# Patient Record
Sex: Female | Born: 1975 | Race: White | Hispanic: No | Marital: Single | Smoking: Current every day smoker
Health system: Southern US, Community
[De-identification: ages and names within clinical notes are randomized; demographics above are authoritative.]

## PROBLEM LIST (undated history)

## (undated) DIAGNOSIS — M797 Fibromyalgia: Secondary | ICD-10-CM

## (undated) DIAGNOSIS — M419 Scoliosis, unspecified: Secondary | ICD-10-CM

## (undated) DIAGNOSIS — G5600 Carpal tunnel syndrome, unspecified upper limb: Secondary | ICD-10-CM

## (undated) DIAGNOSIS — K7689 Other specified diseases of liver: Secondary | ICD-10-CM

## (undated) DIAGNOSIS — K219 Gastro-esophageal reflux disease without esophagitis: Secondary | ICD-10-CM

## (undated) DIAGNOSIS — K449 Diaphragmatic hernia without obstruction or gangrene: Secondary | ICD-10-CM

## (undated) HISTORY — DX: Diaphragmatic hernia without obstruction or gangrene: K44.9

## (undated) HISTORY — PX: CERVICAL SPINE SURGERY: SHX589

## (undated) HISTORY — DX: Scoliosis, unspecified: M41.9

## (undated) HISTORY — DX: Other specified diseases of liver: K76.89

## (undated) HISTORY — DX: Carpal tunnel syndrome, unspecified upper limb: G56.00

## (undated) HISTORY — PX: OTHER SURGICAL HISTORY: SHX169

## (undated) HISTORY — PX: TUBAL LIGATION: SHX77

---

## 1999-03-24 ENCOUNTER — Ambulatory Visit (HOSPITAL_COMMUNITY): Admission: RE | Admit: 1999-03-24 | Discharge: 1999-03-24 | Payer: Self-pay | Admitting: *Deleted

## 2016-06-11 DIAGNOSIS — Z6820 Body mass index (BMI) 20.0-20.9, adult: Secondary | ICD-10-CM | POA: Diagnosis not present

## 2016-06-11 DIAGNOSIS — J069 Acute upper respiratory infection, unspecified: Secondary | ICD-10-CM | POA: Diagnosis not present

## 2016-10-10 DIAGNOSIS — M545 Low back pain: Secondary | ICD-10-CM | POA: Diagnosis not present

## 2016-11-20 DIAGNOSIS — Z720 Tobacco use: Secondary | ICD-10-CM | POA: Diagnosis not present

## 2016-11-20 DIAGNOSIS — Z681 Body mass index (BMI) 19 or less, adult: Secondary | ICD-10-CM | POA: Diagnosis not present

## 2016-11-20 DIAGNOSIS — M545 Low back pain: Secondary | ICD-10-CM | POA: Diagnosis not present

## 2016-12-05 DIAGNOSIS — M545 Low back pain: Secondary | ICD-10-CM | POA: Diagnosis not present

## 2016-12-07 DIAGNOSIS — K047 Periapical abscess without sinus: Secondary | ICD-10-CM | POA: Diagnosis not present

## 2016-12-07 DIAGNOSIS — Z681 Body mass index (BMI) 19 or less, adult: Secondary | ICD-10-CM | POA: Diagnosis not present

## 2016-12-24 DIAGNOSIS — Z20828 Contact with and (suspected) exposure to other viral communicable diseases: Secondary | ICD-10-CM | POA: Diagnosis not present

## 2016-12-24 DIAGNOSIS — J069 Acute upper respiratory infection, unspecified: Secondary | ICD-10-CM | POA: Diagnosis not present

## 2016-12-24 DIAGNOSIS — R51 Headache: Secondary | ICD-10-CM | POA: Diagnosis not present

## 2016-12-24 DIAGNOSIS — Z6820 Body mass index (BMI) 20.0-20.9, adult: Secondary | ICD-10-CM | POA: Diagnosis not present

## 2017-03-24 DIAGNOSIS — J020 Streptococcal pharyngitis: Secondary | ICD-10-CM | POA: Diagnosis not present

## 2017-04-05 DIAGNOSIS — Z1389 Encounter for screening for other disorder: Secondary | ICD-10-CM | POA: Diagnosis not present

## 2017-04-05 DIAGNOSIS — Z0000 Encounter for general adult medical examination without abnormal findings: Secondary | ICD-10-CM | POA: Diagnosis not present

## 2017-04-05 DIAGNOSIS — Z681 Body mass index (BMI) 19 or less, adult: Secondary | ICD-10-CM | POA: Diagnosis not present

## 2017-06-04 DIAGNOSIS — L259 Unspecified contact dermatitis, unspecified cause: Secondary | ICD-10-CM | POA: Diagnosis not present

## 2017-06-04 DIAGNOSIS — L299 Pruritus, unspecified: Secondary | ICD-10-CM | POA: Diagnosis not present

## 2017-06-19 DIAGNOSIS — R1083 Colic: Secondary | ICD-10-CM | POA: Diagnosis not present

## 2017-06-19 DIAGNOSIS — R350 Frequency of micturition: Secondary | ICD-10-CM | POA: Diagnosis not present

## 2017-06-19 DIAGNOSIS — K5900 Constipation, unspecified: Secondary | ICD-10-CM | POA: Diagnosis not present

## 2017-06-19 DIAGNOSIS — Z681 Body mass index (BMI) 19 or less, adult: Secondary | ICD-10-CM | POA: Diagnosis not present

## 2018-01-25 DIAGNOSIS — R05 Cough: Secondary | ICD-10-CM | POA: Diagnosis not present

## 2018-01-25 DIAGNOSIS — J209 Acute bronchitis, unspecified: Secondary | ICD-10-CM | POA: Diagnosis not present

## 2018-04-18 DIAGNOSIS — Z6820 Body mass index (BMI) 20.0-20.9, adult: Secondary | ICD-10-CM | POA: Diagnosis not present

## 2018-04-18 DIAGNOSIS — K047 Periapical abscess without sinus: Secondary | ICD-10-CM | POA: Diagnosis not present

## 2019-01-23 DIAGNOSIS — R05 Cough: Secondary | ICD-10-CM | POA: Diagnosis not present

## 2019-01-23 DIAGNOSIS — J209 Acute bronchitis, unspecified: Secondary | ICD-10-CM | POA: Diagnosis not present

## 2019-07-10 DIAGNOSIS — R600 Localized edema: Secondary | ICD-10-CM | POA: Diagnosis not present

## 2019-07-10 DIAGNOSIS — M25511 Pain in right shoulder: Secondary | ICD-10-CM | POA: Diagnosis not present

## 2019-07-10 DIAGNOSIS — R252 Cramp and spasm: Secondary | ICD-10-CM | POA: Diagnosis not present

## 2019-07-10 DIAGNOSIS — I872 Venous insufficiency (chronic) (peripheral): Secondary | ICD-10-CM | POA: Diagnosis not present

## 2019-07-20 DIAGNOSIS — M25511 Pain in right shoulder: Secondary | ICD-10-CM | POA: Diagnosis not present

## 2019-07-22 DIAGNOSIS — M25511 Pain in right shoulder: Secondary | ICD-10-CM | POA: Diagnosis not present

## 2019-07-22 DIAGNOSIS — M546 Pain in thoracic spine: Secondary | ICD-10-CM | POA: Diagnosis not present

## 2019-07-22 DIAGNOSIS — M542 Cervicalgia: Secondary | ICD-10-CM | POA: Diagnosis not present

## 2019-07-28 DIAGNOSIS — M542 Cervicalgia: Secondary | ICD-10-CM | POA: Diagnosis not present

## 2019-07-28 DIAGNOSIS — M25511 Pain in right shoulder: Secondary | ICD-10-CM | POA: Diagnosis not present

## 2019-07-28 DIAGNOSIS — M546 Pain in thoracic spine: Secondary | ICD-10-CM | POA: Diagnosis not present

## 2019-08-31 DIAGNOSIS — M7541 Impingement syndrome of right shoulder: Secondary | ICD-10-CM | POA: Diagnosis not present

## 2019-08-31 DIAGNOSIS — M25511 Pain in right shoulder: Secondary | ICD-10-CM | POA: Diagnosis not present

## 2019-09-04 DIAGNOSIS — M25511 Pain in right shoulder: Secondary | ICD-10-CM | POA: Diagnosis not present

## 2019-09-11 DIAGNOSIS — M7501 Adhesive capsulitis of right shoulder: Secondary | ICD-10-CM | POA: Diagnosis not present

## 2019-09-11 DIAGNOSIS — M7541 Impingement syndrome of right shoulder: Secondary | ICD-10-CM | POA: Diagnosis not present

## 2019-09-11 DIAGNOSIS — M5412 Radiculopathy, cervical region: Secondary | ICD-10-CM | POA: Diagnosis not present

## 2019-10-07 DIAGNOSIS — M7501 Adhesive capsulitis of right shoulder: Secondary | ICD-10-CM | POA: Diagnosis not present

## 2019-10-15 DIAGNOSIS — M5412 Radiculopathy, cervical region: Secondary | ICD-10-CM | POA: Diagnosis not present

## 2019-10-15 DIAGNOSIS — M4802 Spinal stenosis, cervical region: Secondary | ICD-10-CM | POA: Diagnosis not present

## 2019-10-20 DIAGNOSIS — E559 Vitamin D deficiency, unspecified: Secondary | ICD-10-CM | POA: Diagnosis not present

## 2019-10-20 DIAGNOSIS — R52 Pain, unspecified: Secondary | ICD-10-CM | POA: Diagnosis not present

## 2019-10-20 DIAGNOSIS — J449 Chronic obstructive pulmonary disease, unspecified: Secondary | ICD-10-CM | POA: Diagnosis not present

## 2019-10-20 DIAGNOSIS — M79603 Pain in arm, unspecified: Secondary | ICD-10-CM | POA: Diagnosis not present

## 2019-10-20 DIAGNOSIS — Z79899 Other long term (current) drug therapy: Secondary | ICD-10-CM | POA: Diagnosis not present

## 2019-10-20 DIAGNOSIS — Z01818 Encounter for other preprocedural examination: Secondary | ICD-10-CM | POA: Diagnosis not present

## 2019-10-20 DIAGNOSIS — M5412 Radiculopathy, cervical region: Secondary | ICD-10-CM | POA: Diagnosis not present

## 2019-10-29 DIAGNOSIS — Z1331 Encounter for screening for depression: Secondary | ICD-10-CM | POA: Diagnosis not present

## 2019-10-29 DIAGNOSIS — Z01818 Encounter for other preprocedural examination: Secondary | ICD-10-CM | POA: Diagnosis not present

## 2019-10-29 DIAGNOSIS — M5412 Radiculopathy, cervical region: Secondary | ICD-10-CM | POA: Diagnosis not present

## 2019-11-16 DIAGNOSIS — C50121 Malignant neoplasm of central portion of right male breast: Secondary | ICD-10-CM | POA: Diagnosis not present

## 2019-11-16 DIAGNOSIS — M50122 Cervical disc disorder at C5-C6 level with radiculopathy: Secondary | ICD-10-CM | POA: Diagnosis not present

## 2019-11-16 DIAGNOSIS — M50121 Cervical disc disorder at C4-C5 level with radiculopathy: Secondary | ICD-10-CM | POA: Diagnosis not present

## 2019-11-16 DIAGNOSIS — M5412 Radiculopathy, cervical region: Secondary | ICD-10-CM | POA: Diagnosis not present

## 2019-11-16 DIAGNOSIS — F17210 Nicotine dependence, cigarettes, uncomplicated: Secondary | ICD-10-CM | POA: Diagnosis not present

## 2019-11-16 DIAGNOSIS — Z79891 Long term (current) use of opiate analgesic: Secondary | ICD-10-CM | POA: Diagnosis not present

## 2019-11-16 DIAGNOSIS — G8929 Other chronic pain: Secondary | ICD-10-CM | POA: Diagnosis not present

## 2019-11-16 DIAGNOSIS — Z79899 Other long term (current) drug therapy: Secondary | ICD-10-CM | POA: Diagnosis not present

## 2019-11-16 DIAGNOSIS — J449 Chronic obstructive pulmonary disease, unspecified: Secondary | ICD-10-CM | POA: Diagnosis not present

## 2019-11-16 DIAGNOSIS — K5909 Other constipation: Secondary | ICD-10-CM | POA: Diagnosis not present

## 2019-11-16 DIAGNOSIS — F410 Panic disorder [episodic paroxysmal anxiety] without agoraphobia: Secondary | ICD-10-CM | POA: Diagnosis not present

## 2019-11-17 DIAGNOSIS — Z79899 Other long term (current) drug therapy: Secondary | ICD-10-CM | POA: Diagnosis not present

## 2019-11-17 DIAGNOSIS — Z79891 Long term (current) use of opiate analgesic: Secondary | ICD-10-CM | POA: Diagnosis not present

## 2019-11-17 DIAGNOSIS — F17210 Nicotine dependence, cigarettes, uncomplicated: Secondary | ICD-10-CM | POA: Diagnosis not present

## 2019-11-17 DIAGNOSIS — K5909 Other constipation: Secondary | ICD-10-CM | POA: Diagnosis not present

## 2019-11-17 DIAGNOSIS — J449 Chronic obstructive pulmonary disease, unspecified: Secondary | ICD-10-CM | POA: Diagnosis not present

## 2019-11-17 DIAGNOSIS — M50121 Cervical disc disorder at C4-C5 level with radiculopathy: Secondary | ICD-10-CM | POA: Diagnosis not present

## 2019-11-17 DIAGNOSIS — G8929 Other chronic pain: Secondary | ICD-10-CM | POA: Diagnosis not present

## 2019-11-17 DIAGNOSIS — F410 Panic disorder [episodic paroxysmal anxiety] without agoraphobia: Secondary | ICD-10-CM | POA: Diagnosis not present

## 2020-01-12 DIAGNOSIS — M5412 Radiculopathy, cervical region: Secondary | ICD-10-CM | POA: Diagnosis not present

## 2020-01-12 DIAGNOSIS — Z981 Arthrodesis status: Secondary | ICD-10-CM | POA: Diagnosis not present

## 2020-01-15 DIAGNOSIS — Z20822 Contact with and (suspected) exposure to covid-19: Secondary | ICD-10-CM | POA: Diagnosis not present

## 2020-01-28 DIAGNOSIS — R05 Cough: Secondary | ICD-10-CM | POA: Diagnosis not present

## 2020-02-09 DIAGNOSIS — M542 Cervicalgia: Secondary | ICD-10-CM | POA: Diagnosis not present

## 2020-03-03 DIAGNOSIS — M25611 Stiffness of right shoulder, not elsewhere classified: Secondary | ICD-10-CM | POA: Diagnosis not present

## 2020-03-03 DIAGNOSIS — M25511 Pain in right shoulder: Secondary | ICD-10-CM | POA: Diagnosis not present

## 2020-03-03 DIAGNOSIS — M6281 Muscle weakness (generalized): Secondary | ICD-10-CM | POA: Diagnosis not present

## 2020-03-03 DIAGNOSIS — M542 Cervicalgia: Secondary | ICD-10-CM | POA: Diagnosis not present

## 2020-03-07 DIAGNOSIS — M25511 Pain in right shoulder: Secondary | ICD-10-CM | POA: Diagnosis not present

## 2020-03-07 DIAGNOSIS — M25611 Stiffness of right shoulder, not elsewhere classified: Secondary | ICD-10-CM | POA: Diagnosis not present

## 2020-03-07 DIAGNOSIS — M542 Cervicalgia: Secondary | ICD-10-CM | POA: Diagnosis not present

## 2020-03-07 DIAGNOSIS — M6281 Muscle weakness (generalized): Secondary | ICD-10-CM | POA: Diagnosis not present

## 2020-03-15 DIAGNOSIS — M542 Cervicalgia: Secondary | ICD-10-CM | POA: Diagnosis not present

## 2020-03-18 DIAGNOSIS — M6281 Muscle weakness (generalized): Secondary | ICD-10-CM | POA: Diagnosis not present

## 2020-03-18 DIAGNOSIS — M25511 Pain in right shoulder: Secondary | ICD-10-CM | POA: Diagnosis not present

## 2020-03-18 DIAGNOSIS — M25611 Stiffness of right shoulder, not elsewhere classified: Secondary | ICD-10-CM | POA: Diagnosis not present

## 2020-03-18 DIAGNOSIS — M542 Cervicalgia: Secondary | ICD-10-CM | POA: Diagnosis not present

## 2020-03-25 DIAGNOSIS — M25611 Stiffness of right shoulder, not elsewhere classified: Secondary | ICD-10-CM | POA: Diagnosis not present

## 2020-03-25 DIAGNOSIS — M6281 Muscle weakness (generalized): Secondary | ICD-10-CM | POA: Diagnosis not present

## 2020-03-25 DIAGNOSIS — M25511 Pain in right shoulder: Secondary | ICD-10-CM | POA: Diagnosis not present

## 2020-03-25 DIAGNOSIS — M542 Cervicalgia: Secondary | ICD-10-CM | POA: Diagnosis not present

## 2020-04-05 DIAGNOSIS — M7551 Bursitis of right shoulder: Secondary | ICD-10-CM | POA: Diagnosis not present

## 2020-04-05 DIAGNOSIS — M5412 Radiculopathy, cervical region: Secondary | ICD-10-CM | POA: Diagnosis not present

## 2020-04-07 DIAGNOSIS — M25611 Stiffness of right shoulder, not elsewhere classified: Secondary | ICD-10-CM | POA: Diagnosis not present

## 2020-04-07 DIAGNOSIS — M6281 Muscle weakness (generalized): Secondary | ICD-10-CM | POA: Diagnosis not present

## 2020-04-07 DIAGNOSIS — M542 Cervicalgia: Secondary | ICD-10-CM | POA: Diagnosis not present

## 2020-04-07 DIAGNOSIS — M25511 Pain in right shoulder: Secondary | ICD-10-CM | POA: Diagnosis not present

## 2020-04-19 DIAGNOSIS — M25611 Stiffness of right shoulder, not elsewhere classified: Secondary | ICD-10-CM | POA: Diagnosis not present

## 2020-04-19 DIAGNOSIS — M6281 Muscle weakness (generalized): Secondary | ICD-10-CM | POA: Diagnosis not present

## 2020-04-19 DIAGNOSIS — M25511 Pain in right shoulder: Secondary | ICD-10-CM | POA: Diagnosis not present

## 2020-04-19 DIAGNOSIS — M542 Cervicalgia: Secondary | ICD-10-CM | POA: Diagnosis not present

## 2020-04-22 DIAGNOSIS — M6281 Muscle weakness (generalized): Secondary | ICD-10-CM | POA: Diagnosis not present

## 2020-04-22 DIAGNOSIS — M542 Cervicalgia: Secondary | ICD-10-CM | POA: Diagnosis not present

## 2020-04-22 DIAGNOSIS — M25511 Pain in right shoulder: Secondary | ICD-10-CM | POA: Diagnosis not present

## 2020-04-22 DIAGNOSIS — M25611 Stiffness of right shoulder, not elsewhere classified: Secondary | ICD-10-CM | POA: Diagnosis not present

## 2020-04-27 DIAGNOSIS — M25611 Stiffness of right shoulder, not elsewhere classified: Secondary | ICD-10-CM | POA: Diagnosis not present

## 2020-04-27 DIAGNOSIS — M542 Cervicalgia: Secondary | ICD-10-CM | POA: Diagnosis not present

## 2020-04-27 DIAGNOSIS — M6281 Muscle weakness (generalized): Secondary | ICD-10-CM | POA: Diagnosis not present

## 2020-04-27 DIAGNOSIS — M25511 Pain in right shoulder: Secondary | ICD-10-CM | POA: Diagnosis not present

## 2020-05-05 DIAGNOSIS — M542 Cervicalgia: Secondary | ICD-10-CM | POA: Diagnosis not present

## 2020-05-05 DIAGNOSIS — M25611 Stiffness of right shoulder, not elsewhere classified: Secondary | ICD-10-CM | POA: Diagnosis not present

## 2020-05-05 DIAGNOSIS — M25511 Pain in right shoulder: Secondary | ICD-10-CM | POA: Diagnosis not present

## 2020-05-05 DIAGNOSIS — M6281 Muscle weakness (generalized): Secondary | ICD-10-CM | POA: Diagnosis not present

## 2020-05-11 DIAGNOSIS — M25611 Stiffness of right shoulder, not elsewhere classified: Secondary | ICD-10-CM | POA: Diagnosis not present

## 2020-05-11 DIAGNOSIS — M25511 Pain in right shoulder: Secondary | ICD-10-CM | POA: Diagnosis not present

## 2020-05-11 DIAGNOSIS — M6281 Muscle weakness (generalized): Secondary | ICD-10-CM | POA: Diagnosis not present

## 2020-05-11 DIAGNOSIS — M542 Cervicalgia: Secondary | ICD-10-CM | POA: Diagnosis not present

## 2020-05-13 DIAGNOSIS — M6281 Muscle weakness (generalized): Secondary | ICD-10-CM | POA: Diagnosis not present

## 2020-05-13 DIAGNOSIS — M25511 Pain in right shoulder: Secondary | ICD-10-CM | POA: Diagnosis not present

## 2020-05-13 DIAGNOSIS — M542 Cervicalgia: Secondary | ICD-10-CM | POA: Diagnosis not present

## 2020-05-13 DIAGNOSIS — M25611 Stiffness of right shoulder, not elsewhere classified: Secondary | ICD-10-CM | POA: Diagnosis not present

## 2020-05-18 DIAGNOSIS — M25611 Stiffness of right shoulder, not elsewhere classified: Secondary | ICD-10-CM | POA: Diagnosis not present

## 2020-05-18 DIAGNOSIS — M542 Cervicalgia: Secondary | ICD-10-CM | POA: Diagnosis not present

## 2020-05-18 DIAGNOSIS — M25511 Pain in right shoulder: Secondary | ICD-10-CM | POA: Diagnosis not present

## 2020-05-18 DIAGNOSIS — M6281 Muscle weakness (generalized): Secondary | ICD-10-CM | POA: Diagnosis not present

## 2020-05-27 DIAGNOSIS — S43431A Superior glenoid labrum lesion of right shoulder, initial encounter: Secondary | ICD-10-CM | POA: Diagnosis not present

## 2020-05-27 DIAGNOSIS — M25511 Pain in right shoulder: Secondary | ICD-10-CM | POA: Diagnosis not present

## 2020-05-27 DIAGNOSIS — M7551 Bursitis of right shoulder: Secondary | ICD-10-CM | POA: Diagnosis not present

## 2020-05-27 DIAGNOSIS — M5412 Radiculopathy, cervical region: Secondary | ICD-10-CM | POA: Diagnosis not present

## 2020-05-27 DIAGNOSIS — Z981 Arthrodesis status: Secondary | ICD-10-CM | POA: Diagnosis not present

## 2020-06-02 DIAGNOSIS — M25511 Pain in right shoulder: Secondary | ICD-10-CM | POA: Diagnosis not present

## 2020-06-02 DIAGNOSIS — M25611 Stiffness of right shoulder, not elsewhere classified: Secondary | ICD-10-CM | POA: Diagnosis not present

## 2020-06-02 DIAGNOSIS — M542 Cervicalgia: Secondary | ICD-10-CM | POA: Diagnosis not present

## 2020-06-02 DIAGNOSIS — M6281 Muscle weakness (generalized): Secondary | ICD-10-CM | POA: Diagnosis not present

## 2020-06-06 DIAGNOSIS — M25611 Stiffness of right shoulder, not elsewhere classified: Secondary | ICD-10-CM | POA: Diagnosis not present

## 2020-06-06 DIAGNOSIS — M25511 Pain in right shoulder: Secondary | ICD-10-CM | POA: Diagnosis not present

## 2020-06-06 DIAGNOSIS — M542 Cervicalgia: Secondary | ICD-10-CM | POA: Diagnosis not present

## 2020-06-06 DIAGNOSIS — M6281 Muscle weakness (generalized): Secondary | ICD-10-CM | POA: Diagnosis not present

## 2020-06-07 DIAGNOSIS — R42 Dizziness and giddiness: Secondary | ICD-10-CM | POA: Diagnosis not present

## 2020-06-07 DIAGNOSIS — Z681 Body mass index (BMI) 19 or less, adult: Secondary | ICD-10-CM | POA: Diagnosis not present

## 2020-06-07 DIAGNOSIS — G4762 Sleep related leg cramps: Secondary | ICD-10-CM | POA: Diagnosis not present

## 2020-06-09 DIAGNOSIS — M25511 Pain in right shoulder: Secondary | ICD-10-CM | POA: Diagnosis not present

## 2020-06-09 DIAGNOSIS — M542 Cervicalgia: Secondary | ICD-10-CM | POA: Diagnosis not present

## 2020-06-09 DIAGNOSIS — M6281 Muscle weakness (generalized): Secondary | ICD-10-CM | POA: Diagnosis not present

## 2020-06-09 DIAGNOSIS — M25611 Stiffness of right shoulder, not elsewhere classified: Secondary | ICD-10-CM | POA: Diagnosis not present

## 2020-06-14 DIAGNOSIS — M6281 Muscle weakness (generalized): Secondary | ICD-10-CM | POA: Diagnosis not present

## 2020-06-14 DIAGNOSIS — M25511 Pain in right shoulder: Secondary | ICD-10-CM | POA: Diagnosis not present

## 2020-06-14 DIAGNOSIS — M25611 Stiffness of right shoulder, not elsewhere classified: Secondary | ICD-10-CM | POA: Diagnosis not present

## 2020-06-14 DIAGNOSIS — M542 Cervicalgia: Secondary | ICD-10-CM | POA: Diagnosis not present

## 2020-06-17 DIAGNOSIS — M542 Cervicalgia: Secondary | ICD-10-CM | POA: Diagnosis not present

## 2020-06-17 DIAGNOSIS — M6281 Muscle weakness (generalized): Secondary | ICD-10-CM | POA: Diagnosis not present

## 2020-06-17 DIAGNOSIS — M25511 Pain in right shoulder: Secondary | ICD-10-CM | POA: Diagnosis not present

## 2020-06-17 DIAGNOSIS — M25611 Stiffness of right shoulder, not elsewhere classified: Secondary | ICD-10-CM | POA: Diagnosis not present

## 2020-06-21 DIAGNOSIS — G4762 Sleep related leg cramps: Secondary | ICD-10-CM | POA: Diagnosis not present

## 2020-06-24 DIAGNOSIS — M25511 Pain in right shoulder: Secondary | ICD-10-CM | POA: Diagnosis not present

## 2020-06-24 DIAGNOSIS — M25611 Stiffness of right shoulder, not elsewhere classified: Secondary | ICD-10-CM | POA: Diagnosis not present

## 2020-06-24 DIAGNOSIS — M6281 Muscle weakness (generalized): Secondary | ICD-10-CM | POA: Diagnosis not present

## 2020-06-24 DIAGNOSIS — M542 Cervicalgia: Secondary | ICD-10-CM | POA: Diagnosis not present

## 2020-07-01 DIAGNOSIS — M542 Cervicalgia: Secondary | ICD-10-CM | POA: Diagnosis not present

## 2020-07-01 DIAGNOSIS — M5412 Radiculopathy, cervical region: Secondary | ICD-10-CM | POA: Diagnosis not present

## 2020-07-14 DIAGNOSIS — M542 Cervicalgia: Secondary | ICD-10-CM | POA: Diagnosis not present

## 2020-07-14 DIAGNOSIS — M7501 Adhesive capsulitis of right shoulder: Secondary | ICD-10-CM | POA: Diagnosis not present

## 2020-07-14 DIAGNOSIS — S43431A Superior glenoid labrum lesion of right shoulder, initial encounter: Secondary | ICD-10-CM | POA: Diagnosis not present

## 2020-07-14 DIAGNOSIS — M7551 Bursitis of right shoulder: Secondary | ICD-10-CM | POA: Diagnosis not present

## 2020-07-19 DIAGNOSIS — M6281 Muscle weakness (generalized): Secondary | ICD-10-CM | POA: Diagnosis not present

## 2020-07-19 DIAGNOSIS — M25611 Stiffness of right shoulder, not elsewhere classified: Secondary | ICD-10-CM | POA: Diagnosis not present

## 2020-07-19 DIAGNOSIS — M25511 Pain in right shoulder: Secondary | ICD-10-CM | POA: Diagnosis not present

## 2020-07-19 DIAGNOSIS — M542 Cervicalgia: Secondary | ICD-10-CM | POA: Diagnosis not present

## 2020-07-28 DIAGNOSIS — M542 Cervicalgia: Secondary | ICD-10-CM | POA: Diagnosis not present

## 2020-07-28 DIAGNOSIS — M25611 Stiffness of right shoulder, not elsewhere classified: Secondary | ICD-10-CM | POA: Diagnosis not present

## 2020-07-28 DIAGNOSIS — M6281 Muscle weakness (generalized): Secondary | ICD-10-CM | POA: Diagnosis not present

## 2020-07-28 DIAGNOSIS — M25511 Pain in right shoulder: Secondary | ICD-10-CM | POA: Diagnosis not present

## 2020-08-05 DIAGNOSIS — M25611 Stiffness of right shoulder, not elsewhere classified: Secondary | ICD-10-CM | POA: Diagnosis not present

## 2020-08-05 DIAGNOSIS — M6281 Muscle weakness (generalized): Secondary | ICD-10-CM | POA: Diagnosis not present

## 2020-08-05 DIAGNOSIS — M25511 Pain in right shoulder: Secondary | ICD-10-CM | POA: Diagnosis not present

## 2020-08-05 DIAGNOSIS — M542 Cervicalgia: Secondary | ICD-10-CM | POA: Diagnosis not present

## 2020-08-18 DIAGNOSIS — M25611 Stiffness of right shoulder, not elsewhere classified: Secondary | ICD-10-CM | POA: Diagnosis not present

## 2020-08-18 DIAGNOSIS — M542 Cervicalgia: Secondary | ICD-10-CM | POA: Diagnosis not present

## 2020-08-18 DIAGNOSIS — M25511 Pain in right shoulder: Secondary | ICD-10-CM | POA: Diagnosis not present

## 2020-08-18 DIAGNOSIS — M6281 Muscle weakness (generalized): Secondary | ICD-10-CM | POA: Diagnosis not present

## 2020-08-23 DIAGNOSIS — M6281 Muscle weakness (generalized): Secondary | ICD-10-CM | POA: Diagnosis not present

## 2020-08-23 DIAGNOSIS — M25511 Pain in right shoulder: Secondary | ICD-10-CM | POA: Diagnosis not present

## 2020-08-23 DIAGNOSIS — M542 Cervicalgia: Secondary | ICD-10-CM | POA: Diagnosis not present

## 2020-08-23 DIAGNOSIS — M25611 Stiffness of right shoulder, not elsewhere classified: Secondary | ICD-10-CM | POA: Diagnosis not present

## 2020-08-26 DIAGNOSIS — M25511 Pain in right shoulder: Secondary | ICD-10-CM | POA: Diagnosis not present

## 2020-08-26 DIAGNOSIS — M6281 Muscle weakness (generalized): Secondary | ICD-10-CM | POA: Diagnosis not present

## 2020-08-26 DIAGNOSIS — M542 Cervicalgia: Secondary | ICD-10-CM | POA: Diagnosis not present

## 2020-08-26 DIAGNOSIS — M25611 Stiffness of right shoulder, not elsewhere classified: Secondary | ICD-10-CM | POA: Diagnosis not present

## 2020-09-08 DIAGNOSIS — S43431A Superior glenoid labrum lesion of right shoulder, initial encounter: Secondary | ICD-10-CM | POA: Diagnosis not present

## 2020-09-08 DIAGNOSIS — M7501 Adhesive capsulitis of right shoulder: Secondary | ICD-10-CM | POA: Diagnosis not present

## 2020-09-08 DIAGNOSIS — M7541 Impingement syndrome of right shoulder: Secondary | ICD-10-CM | POA: Diagnosis not present

## 2020-09-28 DIAGNOSIS — M25411 Effusion, right shoulder: Secondary | ICD-10-CM | POA: Diagnosis not present

## 2020-09-28 DIAGNOSIS — M6281 Muscle weakness (generalized): Secondary | ICD-10-CM | POA: Diagnosis not present

## 2020-09-28 DIAGNOSIS — M25611 Stiffness of right shoulder, not elsewhere classified: Secondary | ICD-10-CM | POA: Diagnosis not present

## 2020-09-28 DIAGNOSIS — M25511 Pain in right shoulder: Secondary | ICD-10-CM | POA: Diagnosis not present

## 2020-10-11 DIAGNOSIS — M6281 Muscle weakness (generalized): Secondary | ICD-10-CM | POA: Diagnosis not present

## 2020-10-11 DIAGNOSIS — M25611 Stiffness of right shoulder, not elsewhere classified: Secondary | ICD-10-CM | POA: Diagnosis not present

## 2020-10-11 DIAGNOSIS — M25511 Pain in right shoulder: Secondary | ICD-10-CM | POA: Diagnosis not present

## 2020-10-11 DIAGNOSIS — M25411 Effusion, right shoulder: Secondary | ICD-10-CM | POA: Diagnosis not present

## 2020-10-14 DIAGNOSIS — R110 Nausea: Secondary | ICD-10-CM | POA: Diagnosis not present

## 2020-10-14 DIAGNOSIS — R634 Abnormal weight loss: Secondary | ICD-10-CM | POA: Diagnosis not present

## 2020-10-14 DIAGNOSIS — R631 Polydipsia: Secondary | ICD-10-CM | POA: Diagnosis not present

## 2020-10-14 DIAGNOSIS — E46 Unspecified protein-calorie malnutrition: Secondary | ICD-10-CM | POA: Diagnosis not present

## 2020-10-27 DIAGNOSIS — S43431A Superior glenoid labrum lesion of right shoulder, initial encounter: Secondary | ICD-10-CM | POA: Diagnosis not present

## 2020-10-27 DIAGNOSIS — M7541 Impingement syndrome of right shoulder: Secondary | ICD-10-CM | POA: Diagnosis not present

## 2020-10-27 DIAGNOSIS — M7551 Bursitis of right shoulder: Secondary | ICD-10-CM | POA: Diagnosis not present

## 2020-10-27 DIAGNOSIS — M7501 Adhesive capsulitis of right shoulder: Secondary | ICD-10-CM | POA: Diagnosis not present

## 2020-10-28 DIAGNOSIS — M25611 Stiffness of right shoulder, not elsewhere classified: Secondary | ICD-10-CM | POA: Diagnosis not present

## 2020-10-28 DIAGNOSIS — M25411 Effusion, right shoulder: Secondary | ICD-10-CM | POA: Diagnosis not present

## 2020-10-28 DIAGNOSIS — M6281 Muscle weakness (generalized): Secondary | ICD-10-CM | POA: Diagnosis not present

## 2020-10-28 DIAGNOSIS — M25511 Pain in right shoulder: Secondary | ICD-10-CM | POA: Diagnosis not present

## 2020-11-04 DIAGNOSIS — R634 Abnormal weight loss: Secondary | ICD-10-CM | POA: Diagnosis not present

## 2020-11-04 DIAGNOSIS — R55 Syncope and collapse: Secondary | ICD-10-CM | POA: Diagnosis not present

## 2020-11-04 DIAGNOSIS — Z1331 Encounter for screening for depression: Secondary | ICD-10-CM | POA: Diagnosis not present

## 2020-11-04 DIAGNOSIS — Z681 Body mass index (BMI) 19 or less, adult: Secondary | ICD-10-CM | POA: Diagnosis not present

## 2020-11-04 DIAGNOSIS — E46 Unspecified protein-calorie malnutrition: Secondary | ICD-10-CM | POA: Diagnosis not present

## 2020-11-10 DIAGNOSIS — R634 Abnormal weight loss: Secondary | ICD-10-CM | POA: Diagnosis not present

## 2020-11-10 DIAGNOSIS — I959 Hypotension, unspecified: Secondary | ICD-10-CM | POA: Diagnosis not present

## 2020-11-10 DIAGNOSIS — Z681 Body mass index (BMI) 19 or less, adult: Secondary | ICD-10-CM | POA: Diagnosis not present

## 2020-11-10 DIAGNOSIS — E46 Unspecified protein-calorie malnutrition: Secondary | ICD-10-CM | POA: Diagnosis not present

## 2020-11-22 ENCOUNTER — Ambulatory Visit: Payer: BC Managed Care – PPO | Admitting: Physician Assistant

## 2020-11-22 ENCOUNTER — Encounter: Payer: Self-pay | Admitting: Physician Assistant

## 2020-11-22 ENCOUNTER — Other Ambulatory Visit: Payer: Self-pay

## 2020-11-22 VITALS — BP 100/62 | HR 77 | Temp 97.9°F | Ht 65.0 in | Wt 108.8 lb

## 2020-11-22 DIAGNOSIS — N912 Amenorrhea, unspecified: Secondary | ICD-10-CM | POA: Diagnosis not present

## 2020-11-22 DIAGNOSIS — R5383 Other fatigue: Secondary | ICD-10-CM | POA: Diagnosis not present

## 2020-11-22 DIAGNOSIS — T148XXA Other injury of unspecified body region, initial encounter: Secondary | ICD-10-CM | POA: Diagnosis not present

## 2020-11-22 DIAGNOSIS — Z1231 Encounter for screening mammogram for malignant neoplasm of breast: Secondary | ICD-10-CM

## 2020-11-22 DIAGNOSIS — R634 Abnormal weight loss: Secondary | ICD-10-CM | POA: Diagnosis not present

## 2020-11-22 LAB — POCT URINALYSIS DIP (CLINITEK)
Bilirubin, UA: NEGATIVE
Blood, UA: NEGATIVE
Glucose, UA: NEGATIVE mg/dL
Leukocytes, UA: NEGATIVE
Nitrite, UA: NEGATIVE
POC PROTEIN,UA: NEGATIVE
Spec Grav, UA: 1.015 (ref 1.010–1.025)
Urobilinogen, UA: 0.2 E.U./dL
pH, UA: 6 (ref 5.0–8.0)

## 2020-11-22 LAB — POCT URINE PREGNANCY: Preg Test, Ur: NEGATIVE

## 2020-11-22 NOTE — Progress Notes (Signed)
New Patient Office Visit  Subjective:  Patient ID: Sarah [name], female    DOB: [birth year]  Age: 44 y.o. MRN: [id number]  CC:  Chief Complaint  Patient presents with   Fatigue    HPI [name] presents for fatigue - pt states that she has had trouble with fatigue for several months - mentions that she saw her prior physician about a month ago and all labwork including HIV and Hep B 'was normal'- (will send for records) States she is having no problems sleeping  Pt states that she has been gradually losing weight - says about a year ago she weighed about 120 - since sept about 114 and now 108 She has had some intermittent nausea but denies abdominal pain and no vomiting bms have been normal - no melena or hematochezia History of irregular periods - last was 10/05/20 History of smoker but states had a normal chest xray earlier this month (will send for records)  Pt also mentions at times she bruises easily for no reason  Last pap 3 years ago per patient and was normal Has never had a mammogram  Past Medical History:  Diagnosis Date   Scoliosis     Past Surgical History:  Procedure Laterality Date   CERVICAL SPINE SURGERY     TUBAL LIGATION      Family History  Problem Relation Age of Onset   Heart disease Mother    Hyperlipidemia Mother    Anxiety disorder Sister    GER disease Son    Asthma Son    GER disease Son    Heart disease Son     Social History   Socioeconomic History   Marital status: Single    Spouse name: Not on file   Number of children: Not on file   Years of education: Not on file   Highest education level: Not on file  Occupational History   Not on file  Tobacco Use   Smoking status: Current Every Day Smoker    Packs/day: 0.50    Years: 10.00    Pack years: 5.00    Types: Cigarettes   Smokeless tobacco: Never Used  Scientific laboratory technician Use: Never used  Substance and Sexual Activity   Alcohol use: Not  Currently   Drug use: Never   Sexual activity: Not Currently  Other Topics Concern   Not on file  Social History Narrative   Not on file   Social Determinants of Health   Financial Resource Strain: Not on file  Food Insecurity: Not on file  Transportation Needs: Not on file  Physical Activity: Not on file  Stress: Not on file  Social Connections: Not on file  Intimate Partner Violence: Not on file    No current outpatient medications on file.   Allergies  Allergen Reactions   Mobic [Meloxicam]     ROS CONSTITUTIONAL: see HPI E/N/T: Negative for ear pain, nasal congestion and sore throat.  CARDIOVASCULAR: Negative for chest pain, dizziness, palpitations and pedal edema.  RESPIRATORY: Negative for recent cough and dyspnea.  GASTROINTESTINAL: see HPI MSK: Negative for arthralgias and myalgias.  INTEGUMENTARY: Negative for rash.  NEUROLOGICAL: Negative for dizziness and headaches.  PSYCHIATRIC: Negative for sleep disturbance and to question depression screen.  Negative for depression, negative for anhedonia.        Objective:    PHYSICAL EXAM:   VS: BP 100/62 (BP Location: Left Arm, Patient Position: Sitting, Cuff Size: Small)  Pulse 77    Temp 97.9 F (36.6 C) (Temporal)    Ht $R'5\' 5"'uB$  (1.651 m)    Wt 108 lb 12.8 oz (49.4 kg)    SpO2 99%    BMI 18.11 kg/m   GEN: thin, well developed, in no acute distress  HEENT: normal external ears and nose - normal external auditory canals and TMS - hearing grossly normal - normal nasal mucosa and septum - Lips, Teeth and Gums - normal  Oropharynx - normal mucosa, palate, and posterior pharynx  Cardiac: RRR; no murmurs, rubs, or gallops,no edema - Respiratory:  normal respiratory rate and pattern with no distress - normal breath sounds with no rales, rhonchi, wheezes or rubs GI: normal bowel sounds, no masses or tenderness MS: no deformity or atrophy  Skin: warm and dry, no rash - few bruises noted on arms Neuro:  Alert and  Oriented x 3, Strength and sensation are intact - CN II-Xii grossly intact Psych: euthymic mood, appropriate affect and demeanor  BP 100/62 (BP Location: Left Arm, Patient Position: Sitting, Cuff Size: Small)    Pulse 77    Temp 97.9 F (36.6 C) (Temporal)    Ht $R'5\' 5"'ID$  (1.651 m)    Wt 108 lb 12.8 oz (49.4 kg)    SpO2 99%    BMI 18.11 kg/m  Wt Readings from Last 3 Encounters:  11/22/20 108 lb 12.8 oz (49.4 kg)    Office Visit on 11/22/2020  Component Date Value Ref Range Status   Preg Test, Ur 11/22/2020 Negative  Negative Final   Glucose, UA 11/22/2020 negative  negative mg/dL Final   Bilirubin, UA 11/22/2020 negative  negative Final   Ketones, POC UA 11/22/2020 trace (5)* negative mg/dL Final   Spec Grav, UA 11/22/2020 1.015  1.010 - 1.025 Final   Blood, UA 11/22/2020 negative  negative Final   pH, UA 11/22/2020 6.0  5.0 - 8.0 Final   POC PROTEIN,UA 11/22/2020 negative  negative, trace Final   Urobilinogen, UA 11/22/2020 0.2  0.2 or 1.0 E.U./dL Final   Nitrite, UA 11/22/2020 Negative  Negative Final   Leukocytes, UA 11/22/2020 Negative  Negative Final    Health Maintenance Due  Topic Date Due   Hepatitis C Screening  Never done   HIV Screening  Never done   TETANUS/TDAP  Never done   PAP SMEAR-Modifier  Never done    There are no preventive care reminders to display for this patient.  No results found for: TSH No results found for: WBC, HGB, HCT, MCV, PLT No results found for: NA, K, CHLORIDE, CO2, GLUCOSE, BUN, CREATININE, BILITOT, ALKPHOS, AST, ALT, PROT, ALBUMIN, CALCIUM, ANIONGAP, EGFR, GFR No results found for: CHOL No results found for: HDL No results found for: LDLCALC No results found for: TRIG No results found for: CHOLHDL No results found for: HGBA1C    Assessment & Plan:   Problem List Items Addressed This Visit   None   Visit Diagnoses    Other fatigue    -  Primary   Relevant Orders   POCT URINALYSIS DIP (CLINITEK) (Completed)   CBC  with Differential/Platelet   Comprehensive metabolic panel   Thyroid Panel With TSH   Bruising       Relevant Orders   Protime-INR   Amenorrhea       Relevant Orders   POCT urine pregnancy (Completed)   Weight loss       Relevant Orders   POCT URINALYSIS DIP (CLINITEK) (Completed)   CBC  with Differential/Platelet   Comprehensive metabolic panel   Thyroid Panel With TSH   Encounter for mammogram to establish baseline mammogram       Relevant Orders   MM Digital Screening      No orders of the defined types were placed in this encounter.   Follow-up: No follow-ups on file.    SARA R , PA-C

## 2020-11-23 LAB — COMPREHENSIVE METABOLIC PANEL
ALT: 33 IU/L — ABNORMAL HIGH (ref 0–32)
AST: 30 IU/L (ref 0–40)
Albumin/Globulin Ratio: 1.9 (ref 1.2–2.2)
Albumin: 4.5 g/dL (ref 3.8–4.8)
Alkaline Phosphatase: 56 IU/L (ref 44–121)
BUN/Creatinine Ratio: 13 (ref 9–23)
BUN: 10 mg/dL (ref 6–24)
Bilirubin Total: 0.4 mg/dL (ref 0.0–1.2)
CO2: 26 mmol/L (ref 20–29)
Calcium: 10.4 mg/dL — ABNORMAL HIGH (ref 8.7–10.2)
Chloride: 101 mmol/L (ref 96–106)
Creatinine, Ser: 0.76 mg/dL (ref 0.57–1.00)
GFR calc Af Amer: 111 mL/min/{1.73_m2} (ref >59)
GFR calc non Af Amer: 96 mL/min/{1.73_m2} (ref >59)
Globulin, Total: 2.4 g/dL (ref 1.5–4.5)
Glucose: 90 mg/dL (ref 65–99)
Potassium: 4.5 mmol/L (ref 3.5–5.2)
Sodium: 139 mmol/L (ref 134–144)
Total Protein: 6.9 g/dL (ref 6.0–8.5)

## 2020-11-23 LAB — THYROID PANEL WITH TSH
Free Thyroxine Index: 2.2 (ref 1.2–4.9)
T3 Uptake Ratio: 27 % (ref 24–39)
T4, Total: 8 ug/dL (ref 4.5–12.0)
TSH: 1.04 u[IU]/mL (ref 0.450–4.500)

## 2020-11-23 LAB — CBC WITH DIFFERENTIAL/PLATELET
Basophils Absolute: 0 10*3/uL (ref 0.0–0.2)
Basos: 0 %
EOS (ABSOLUTE): 0.1 10*3/uL (ref 0.0–0.4)
Eos: 2 %
Hematocrit: 42.1 % (ref 34.0–46.6)
Hemoglobin: 14.3 g/dL (ref 11.1–15.9)
Immature Grans (Abs): 0 10*3/uL (ref 0.0–0.1)
Immature Granulocytes: 0 %
Lymphocytes Absolute: 2 10*3/uL (ref 0.7–3.1)
Lymphs: 30 %
MCH: 30.6 pg (ref 26.6–33.0)
MCHC: 34 g/dL (ref 31.5–35.7)
MCV: 90 fL (ref 79–97)
Monocytes Absolute: 0.6 10*3/uL (ref 0.1–0.9)
Monocytes: 8 %
Neutrophils Absolute: 4.1 10*3/uL (ref 1.4–7.0)
Neutrophils: 60 %
Platelets: 229 10*3/uL (ref 150–450)
RBC: 4.67 x10E6/uL (ref 3.77–5.28)
RDW: 12.5 % (ref 11.7–15.4)
WBC: 6.8 10*3/uL (ref 3.4–10.8)

## 2020-11-23 LAB — PROTIME-INR
INR: 1 (ref 0.9–1.2)
Prothrombin Time: 10 s (ref 9.1–12.0)

## 2020-11-26 ENCOUNTER — Other Ambulatory Visit: Payer: Self-pay | Admitting: Physician Assistant

## 2020-11-26 DIAGNOSIS — R634 Abnormal weight loss: Secondary | ICD-10-CM

## 2020-11-28 DIAGNOSIS — M6281 Muscle weakness (generalized): Secondary | ICD-10-CM | POA: Diagnosis not present

## 2020-11-28 DIAGNOSIS — M25511 Pain in right shoulder: Secondary | ICD-10-CM | POA: Diagnosis not present

## 2020-11-28 DIAGNOSIS — M25411 Effusion, right shoulder: Secondary | ICD-10-CM | POA: Diagnosis not present

## 2020-11-28 DIAGNOSIS — M25611 Stiffness of right shoulder, not elsewhere classified: Secondary | ICD-10-CM | POA: Diagnosis not present

## 2020-12-15 ENCOUNTER — Telehealth (INDEPENDENT_AMBULATORY_CARE_PROVIDER_SITE_OTHER): Payer: BC Managed Care – PPO | Admitting: Physician Assistant

## 2020-12-15 ENCOUNTER — Encounter: Payer: Self-pay | Admitting: Physician Assistant

## 2020-12-15 ENCOUNTER — Other Ambulatory Visit: Payer: Self-pay

## 2020-12-15 VITALS — BP 90/69 | HR 85 | Temp 97.4°F | Ht 66.0 in | Wt 110.0 lb

## 2020-12-15 DIAGNOSIS — R6883 Chills (without fever): Secondary | ICD-10-CM | POA: Diagnosis not present

## 2020-12-15 DIAGNOSIS — R519 Headache, unspecified: Secondary | ICD-10-CM

## 2020-12-15 DIAGNOSIS — R5381 Other malaise: Secondary | ICD-10-CM | POA: Diagnosis not present

## 2020-12-15 DIAGNOSIS — R059 Cough, unspecified: Secondary | ICD-10-CM

## 2020-12-15 LAB — POC COVID19 BINAXNOW: SARS Coronavirus 2 Ag: POSITIVE — AB

## 2020-12-15 NOTE — Progress Notes (Signed)
Virtual Visit via Telephone Note   This visit type was conducted due to national recommendations for restrictions regarding the COVID-19 Pandemic (e.g. social distancing) in an effort to limit this patient's exposure and mitigate transmission in our community.  Due to her co-morbid illnesses, this patient is at least at moderate risk for complications without adequate follow up.  This format is felt to be most appropriate for this patient at this time.  The patient did not have access to video technology/had technical difficulties with video requiring transitioning to audio format only (telephone).  All issues noted in this document were discussed and addressed.  No physical exam could be performed with this format.  Patient verbally consented to a telehealth visit.   Date:  12/15/2020   ID:  Sarah [name], DOB 01-26-1976, MRN [id number]  Patient Location: Home Provider Location: Office  PCP:  Marge Duncans, PA-C   Chief Complaint:  Malaise/COVID exposure  History of Present Illness:    Sarah [name] is a 45 y.o. female with 4 days of symptoms including malaise, headache, chills, decreased appetite and diarrhea - has minimal cough and congestion and denies fever - son tested positive for COVID 4 days ago  The patient does have symptoms concerning for COVID-19 infection (fever, chills, cough, or new shortness of breath).    Past Medical History:  Diagnosis Date  . Scoliosis    Past Surgical History:  Procedure Laterality Date  . CERVICAL SPINE SURGERY    . TUBAL LIGATION       Current Meds  Medication Sig  . Dextromethorphan HBr (ROBITUSSIN MAXIMUM STRENGTH PO) Take by mouth.     Allergies:   Mobic [meloxicam]   Social History   Tobacco Use  . Smoking status: Current Every Day Smoker    Packs/day: 0.50    Years: 10.00    Pack years: 5.00    Types: Cigarettes  . Smokeless tobacco: Never Used  Vaping Use  . Vaping Use: Never used  Substance Use Topics  . Alcohol  use: Not Currently  . Drug use: Never     Family Hx: The patient's family history includes Anxiety disorder in her sister; Asthma in her son; GER disease in her son and son; Heart disease in her mother and son; Hyperlipidemia in her mother.  ROS:   Please see the history of present illness.    All other systems reviewed and are negative.  Labs/Other Tests and Data Reviewed:    Recent Labs: 11/22/2020: ALT 33; BUN 10; Creatinine, Ser 0.76; Hemoglobin 14.3; Platelets 229; Potassium 4.5; Sodium 139; TSH 1.040   Recent Lipid Panel No results found for: CHOL, TRIG, HDL, CHOLHDL, LDLCALC, LDLDIRECT  Wt Readings from Last 3 Encounters:  12/15/20 110 lb (49.9 kg)  11/22/20 108 lb 12.8 oz (49.4 kg)     Objective:    Vital Signs:  BP 90/69   Pulse 85   Temp (!) 97.4 F (36.3 C)   Ht 5\' 6"  (1.676 m)   Wt 110 lb (49.9 kg)   SpO2 97%   BMI 17.75 kg/m    VITAL SIGNS:  reviewed Video Visit on 12/15/2020  Component Date Value Ref Range Status  . SARS Coronavirus 2 Ag 12/15/2020 Positive* Negative Final   patient aware    ASSESSMENT & PLAN:    1. COVID 19 - recommend to treat symptoms - rest, fluids, tylenol and quarantine according to [facility] guidelines  COVID-19 Education: The signs and symptoms of COVID-19 were discussed  with the patient and how to seek care for testing (follow up with PCP or arrange E-visit). The importance of social distancing was discussed today.  Time:   Today, I have spent 10 minutes with the patient with telehealth technology discussing the above problems.   Philipp Ovens CMA recorded vitals and chief complaint  Medication Adjustments/Labs and Tests Ordered: Current medicines are reviewed at length with the patient today.  Concerns regarding medicines are outlined above.   Tests Ordered: Orders Placed This Encounter  Procedures  . POC COVID-19 BinaxNow    Medication Changes: No orders of the defined types were placed in this encounter.   Follow  Up:  In Person prn  Signed, Webb Silversmith, PA-C  12/15/2020 2:02 PM    [place]

## 2020-12-22 ENCOUNTER — Ambulatory Visit: Payer: BC Managed Care – PPO | Admitting: Physician Assistant

## 2020-12-29 DIAGNOSIS — M25611 Stiffness of right shoulder, not elsewhere classified: Secondary | ICD-10-CM | POA: Diagnosis not present

## 2020-12-29 DIAGNOSIS — M6281 Muscle weakness (generalized): Secondary | ICD-10-CM | POA: Diagnosis not present

## 2020-12-29 DIAGNOSIS — M25511 Pain in right shoulder: Secondary | ICD-10-CM | POA: Diagnosis not present

## 2020-12-29 DIAGNOSIS — M25411 Effusion, right shoulder: Secondary | ICD-10-CM | POA: Diagnosis not present

## 2021-01-02 ENCOUNTER — Ambulatory Visit: Payer: BC Managed Care – PPO | Admitting: Endocrinology

## 2021-01-02 ENCOUNTER — Other Ambulatory Visit: Payer: Self-pay

## 2021-01-02 DIAGNOSIS — R634 Abnormal weight loss: Secondary | ICD-10-CM | POA: Diagnosis not present

## 2021-01-02 DIAGNOSIS — R739 Hyperglycemia, unspecified: Secondary | ICD-10-CM | POA: Diagnosis not present

## 2021-01-02 HISTORY — DX: Abnormal weight loss: R63.4

## 2021-01-02 LAB — GLUCOSE, RANDOM: Glucose, Bld: 88 mg/dL (ref 70–99)

## 2021-01-02 LAB — CORTISOL
Cortisol, Plasma: 5 ug/dL
Cortisol, Plasma: 19.5 ug/dL

## 2021-01-02 LAB — HEMOGLOBIN A1C: Hgb A1c MFr Bld: 5.4 % (ref 4.6–6.5)

## 2021-01-02 MED ORDER — COSYNTROPIN 0.25 MG IJ SOLR
0.2500 mg | Freq: Once | INTRAMUSCULAR | Status: AC
  Administered 2021-01-02: 0.25 mg via INTRAVENOUS

## 2021-01-02 NOTE — Patient Instructions (Addendum)
Blood tests are requested for you today.  We'll let you know about the results.  

## 2021-01-02 NOTE — Progress Notes (Signed)
Subjective:    Patient ID: Sarah [name], female    DOB: 08-11-1976, 45 y.o.   MRN: [id number]  HPI Pt is referred by Charlott Holler, NP, for poss Addison's Disease.  no h/o abdominal or brain injury.  No h/o cancer, thyroid problems, seizures, amyloidosis, tuberculosis, or diabetes.  No recent steroids.  No h/o ketoconazole, rifampin, or dilantin.  she has never had adrenal imaging.  Pt says she has lost 20 lbs x 1 year--unintentional, and despite efforts to eat.  She also reports cold intolerance, intermitt nausea, tremor, lightheadedness, and fatigue.  Pt says she has cbg meter: cbg varies from 65-221.  She has had TL.   Past Medical History:  Diagnosis Date   Scoliosis     Past Surgical History:  Procedure Laterality Date   CERVICAL SPINE SURGERY     TUBAL LIGATION      Social History   Socioeconomic History   Marital status: Single    Spouse name: Not on file   Number of children: Not on file   Years of education: Not on file   Highest education level: Not on file  Occupational History   Not on file  Tobacco Use   Smoking status: Current Every Day Smoker    Packs/day: 0.50    Years: 10.00    Pack years: 5.00    Types: Cigarettes   Smokeless tobacco: Never Used  Vaping Use   Vaping Use: Never used  Substance and Sexual Activity   Alcohol use: Not Currently   Drug use: Never   Sexual activity: Not Currently  Other Topics Concern   Not on file  Social History Narrative   Not on file   Social Determinants of Health   Financial Resource Strain: Not on file  Food Insecurity: Not on file  Transportation Needs: Not on file  Physical Activity: Not on file  Stress: Not on file  Social Connections: Not on file  Intimate Partner Violence: Not on file    Current Outpatient Medications on File Prior to Visit  Medication Sig Dispense Refill   Dextromethorphan HBr (ROBITUSSIN MAXIMUM STRENGTH PO) Take by mouth.     No current facility-administered  medications on file prior to visit.    Allergies  Allergen Reactions   Mobic [Meloxicam]     Family History  Problem Relation Age of Onset   Heart disease Mother    Hyperlipidemia Mother    Anxiety disorder Sister    GER disease Son    Asthma Son    GER disease Son    Heart disease Son     BP 110/74 (BP Location: Right Arm, Patient Position: Sitting, Cuff Size: Normal)    Pulse 98    Ht 5\' 6"  (1.676 m)    Wt 113 lb 12.8 oz (51.6 kg)    SpO2 98%    BMI 18.37 kg/m    Review of Systems Denies n/v, abd pain, vitiligo, and change in skin tone.      Objective:   Physical Exam VS: see vs page GEN: no distress HEAD: head: no deformity eyes: no periorbital swelling, no proptosis external nose and ears are normal NECK: supple, thyroid is not enlarged CHEST WALL: no deformity LUNGS: clear to auscultation CV: reg rate and rhythm, no murmur.  MUSCULOSKELETAL: gait is normal and steady EXTEMITIES: no deformity.  no leg edema NEURO:  readily moves all 4's.  sensation is intact to touch on all 4's SKIN:  Normal texture and temperature.  No rash or suspicious lesion is visible.   NODES:  None palpable at the neck PSYCH: alert, well-oriented.  Does not appear anxious nor depressed.  Lab Results  Component Value Date   TSH 1.040 11/22/2020   T4TOTAL 8.0 11/22/2020   Lab Results  Component Value Date   WBC 6.8 11/22/2020   HGB 14.3 11/22/2020   HCT 42.1 11/22/2020   MCV 90 11/22/2020   PLT 229 11/22/2020   Lab Results  Component Value Date   CREATININE 0.76 11/22/2020   BUN 10 11/22/2020   NA 139 11/22/2020   K 4.5 11/22/2020   CL 101 11/22/2020   CO2 26 11/22/2020   ACTH stimulation test is done: baseline cortisol level=5 then Cosyntropin 250 mcg is given im 45 minutes later, cortisol level=20 (normal response)   I have reviewed outside records, and summarized: Pt was noted to have weight loss, and referred here.  At recent primary care provider visit,  pt reported GI and resp sxs.       Assessment & Plan:  Weight loss, new to me, uncertain etiology and prognosis.  Adrenal cortical insuff is excluded.   Hypoglycemia, mild.  Check labs.  Patient Instructions  Blood tests are requested for you today.  We'll let you know about the results.

## 2021-01-03 LAB — INSULIN, RANDOM: Insulin: 2.9 u[IU]/mL

## 2021-01-06 ENCOUNTER — Encounter: Payer: Self-pay | Admitting: Physician Assistant

## 2021-01-06 ENCOUNTER — Ambulatory Visit: Payer: BC Managed Care – PPO | Admitting: Physician Assistant

## 2021-01-06 ENCOUNTER — Other Ambulatory Visit: Payer: Self-pay

## 2021-01-06 VITALS — BP 110/72 | HR 87 | Temp 97.3°F | Ht 66.0 in | Wt 113.8 lb

## 2021-01-06 DIAGNOSIS — R5381 Other malaise: Secondary | ICD-10-CM

## 2021-01-06 DIAGNOSIS — R3589 Other polyuria: Secondary | ICD-10-CM | POA: Insufficient documentation

## 2021-01-06 DIAGNOSIS — Z23 Encounter for immunization: Secondary | ICD-10-CM | POA: Diagnosis not present

## 2021-01-06 DIAGNOSIS — R634 Abnormal weight loss: Secondary | ICD-10-CM

## 2021-01-06 HISTORY — DX: Other polyuria: R35.89

## 2021-01-06 HISTORY — DX: Encounter for immunization: Z23

## 2021-01-06 HISTORY — DX: Hypercalcemia: E83.52

## 2021-01-06 HISTORY — DX: Other malaise: R53.81

## 2021-01-06 LAB — POCT URINALYSIS DIP (CLINITEK)
Bilirubin, UA: NEGATIVE
Blood, UA: NEGATIVE
Glucose, UA: NEGATIVE mg/dL
Ketones, POC UA: NEGATIVE mg/dL
Leukocytes, UA: NEGATIVE
Nitrite, UA: NEGATIVE
POC PROTEIN,UA: NEGATIVE
Spec Grav, UA: 1.01 (ref 1.010–1.025)
Urobilinogen, UA: 0.2 E.U./dL
pH, UA: 7.5 (ref 5.0–8.0)

## 2021-01-06 LAB — POCT URINE PREGNANCY: Preg Test, Ur: NEGATIVE

## 2021-01-06 NOTE — Progress Notes (Signed)
New Patient Office Visit  Subjective:  Patient ID: Sarah [name], female    DOB: 1976-05-27  Age: 45 y.o. MRN: [id number]  CC:  Chief Complaint  Patient presents with  . Fatigue    HPI Sarah [name] presents for follow up of fatigue - she has had a thorough workup with labwork and has even seen endocrinologist this week as well for further lab testing States she is not depressed or having anxiety Has no trouble sleeping  Pt states that she has continued to have trouble with her weight - has to eat every 2 hours (fatty meals and snacks per patient) so that she won't lose more weight Her weight is actually stable - last visit was 113 and today 113.8 She was referred to Dr Lyda Jester but pt states she never heard - actually they tried calling 4 times to notify patient and she did not answer phone --- I have given her their info for her to call and rescheduled She has had some intermittent nausea but denies abdominal pain and no vomiting bms have been normal - no melena or hematochezia History of irregular periods - last was 10/05/20  Patient states that she has had urine frequency 'for awhile' - denies dysuria  Pt had elevated calcium at last visit - due to recheck  Pt would like tetanus shot today  Past Medical History:  Diagnosis Date  . Scoliosis     Past Surgical History:  Procedure Laterality Date  . CERVICAL SPINE SURGERY    . TUBAL LIGATION      Family History  Problem Relation Age of Onset  . Heart disease Mother   . Hyperlipidemia Mother   . Anxiety disorder Sister   . GER disease Son   . Asthma Son   . GER disease Son   . Heart disease Son     Social History   Socioeconomic History  . Marital status: Single    Spouse name: Not on file  . Number of children: Not on file  . Years of education: Not on file  . Highest education level: Not on file  Occupational History  . Not on file  Tobacco Use  . Smoking status: Current Every Day Smoker     Packs/day: 0.50    Years: 10.00    Pack years: 5.00    Types: Cigarettes  . Smokeless tobacco: Never Used  Vaping Use  . Vaping Use: Never used  Substance and Sexual Activity  . Alcohol use: Not Currently  . Drug use: Never  . Sexual activity: Not Currently  Other Topics Concern  . Not on file  Social History Narrative  . Not on file   Social Determinants of Health   Financial Resource Strain: Not on file  Food Insecurity: Not on file  Transportation Needs: Not on file  Physical Activity: Not on file  Stress: Not on file  Social Connections: Not on file  Intimate Partner Violence: Not on file    No current outpatient medications on file.   Allergies  Allergen Reactions  . Mobic [Meloxicam]     ROS CONSTITUTIONAL: see HPI E/N/T: Negative for ear pain, nasal congestion and sore throat.  CARDIOVASCULAR: Negative for chest pain, dizziness, palpitations and pedal edema.  RESPIRATORY: Negative for recent cough and dyspnea.  GASTROINTESTINAL: see HPI GU- see HPI MSK: Negative for arthralgias and myalgias.  INTEGUMENTARY: Negative for rash.  NEUROLOGICAL: Negative for dizziness and headaches.  PSYCHIATRIC: Negative for sleep disturbance and to  question depression screen.  Negative for depression, negative for anhedonia.        Objective:    PHYSICAL EXAM:   VS: BP 110/72 (BP Location: Right Arm, Patient Position: Sitting, Cuff Size: Normal)   Pulse 87   Temp (!) 97.3 F (36.3 C) (Temporal)   Ht 5\' 6"  (1.676 m)   Wt 113 lb 12.8 oz (51.6 kg)   SpO2 98%   BMI 18.37 kg/m   GEN: thin, well developed, in no acute distress  Cardiac: RRR; no murmurs, rubs, or gallops, Respiratory:  normal respiratory rate and pattern with no distress - normal breath sounds with no rales, rhonchi, wheezes or rubs Skin: warm and dry, no rash -  Neuro:  Alert and Oriented x 3, Strength and sensation are intact - CN II-Xii grossly intact Psych: euthymic mood, appropriate affect and  demeanor  BP 110/72 (BP Location: Right Arm, Patient Position: Sitting, Cuff Size: Normal)   Pulse 87   Temp (!) 97.3 F (36.3 C) (Temporal)   Ht 5\' 6"  (1.676 m)   Wt 113 lb 12.8 oz (51.6 kg)   SpO2 98%   BMI 18.37 kg/m  Wt Readings from Last 3 Encounters:  01/06/21 113 lb 12.8 oz (51.6 kg)  01/02/21 113 lb 12.8 oz (51.6 kg)  12/15/20 110 lb (49.9 kg)   Office Visit on 01/06/2021  Component Date Value Ref Range Status  . Glucose, UA 01/06/2021 negative  negative mg/dL Final  . Bilirubin, UA 01/06/2021 negative  negative Final  . Ketones, POC UA 01/06/2021 negative  negative mg/dL Final  . Spec Grav, UA 01/06/2021 1.010  1.010 - 1.025 Final  . Blood, UA 01/06/2021 negative  negative Final  . pH, UA 01/06/2021 7.5  5.0 - 8.0 Final  . POC PROTEIN,UA 01/06/2021 negative  negative, trace Final  . Urobilinogen, UA 01/06/2021 0.2  0.2 or 1.0 E.U./dL Final  . Nitrite, UA 01/06/2021 Negative  Negative Final  . Leukocytes, UA 01/06/2021 Negative  Negative Final    Office Visit on 01/06/2021  Component Date Value Ref Range Status  . Glucose, UA 01/06/2021 negative  negative mg/dL Final  . Bilirubin, UA 01/06/2021 negative  negative Final  . Ketones, POC UA 01/06/2021 negative  negative mg/dL Final  . Spec Grav, UA 01/06/2021 1.010  1.010 - 1.025 Final  . Blood, UA 01/06/2021 negative  negative Final  . pH, UA 01/06/2021 7.5  5.0 - 8.0 Final  . POC PROTEIN,UA 01/06/2021 negative  negative, trace Final  . Urobilinogen, UA 01/06/2021 0.2  0.2 or 1.0 E.U./dL Final  . Nitrite, UA 01/06/2021 Negative  Negative Final  . Leukocytes, UA 01/06/2021 Negative  Negative Final    Health Maintenance Due  Topic Date Due  . Hepatitis C Screening  Never done  . HIV Screening  Never done  . PAP SMEAR-Modifier  Never done    There are no preventive care reminders to display for this patient.  Lab Results  Component Value Date   TSH 1.040 11/22/2020   Lab Results  Component Value Date   WBC  6.8 11/22/2020   HGB 14.3 11/22/2020   HCT 42.1 11/22/2020   MCV 90 11/22/2020   PLT 229 11/22/2020   Lab Results  Component Value Date   NA 139 11/22/2020   K 4.5 11/22/2020   CO2 26 11/22/2020   GLUCOSE 88 01/02/2021   BUN 10 11/22/2020   CREATININE 0.76 11/22/2020   BILITOT 0.4 11/22/2020   ALKPHOS 56 11/22/2020  AST 30 11/22/2020   ALT 33 (H) 11/22/2020   PROT 6.9 11/22/2020   ALBUMIN 4.5 11/22/2020   CALCIUM 10.4 (H) 11/22/2020   No results found for: CHOL No results found for: HDL No results found for: LDLCALC No results found for: TRIG No results found for: CHOLHDL Lab Results  Component Value Date   HGBA1C 5.4 01/02/2021      Assessment & Plan:   Problem List Items Addressed This Visit      Other   Weight loss - Primary   Polyuria   Relevant Orders   POCT URINALYSIS DIP (CLINITEK) (Completed) Pt to call Dr Lyda Jester to schedule   Hypercalcemia   Relevant Orders   Comprehensive metabolic panel   Malaise   Relevant Orders   POCT urine pregnancy   Need for tetanus, diphtheria, and acellular pertussis (Tdap) vaccine   Relevant Orders   Tdap vaccine greater than or equal to 7yo IM (Completed)      No orders of the defined types were placed in this encounter.   Follow-up: Return if symptoms worsen or fail to improve.    SARA R , PA-C

## 2021-01-07 LAB — COMPREHENSIVE METABOLIC PANEL
ALT: 23 IU/L (ref 0–32)
AST: 22 IU/L (ref 0–40)
Albumin/Globulin Ratio: 1.8 (ref 1.2–2.2)
Albumin: 4.2 g/dL (ref 3.8–4.8)
Alkaline Phosphatase: 63 IU/L (ref 44–121)
BUN/Creatinine Ratio: 14 (ref 9–23)
BUN: 10 mg/dL (ref 6–24)
Bilirubin Total: 0.4 mg/dL (ref 0.0–1.2)
CO2: 22 mmol/L (ref 20–29)
Calcium: 9.9 mg/dL (ref 8.7–10.2)
Chloride: 104 mmol/L (ref 96–106)
Creatinine, Ser: 0.74 mg/dL (ref 0.57–1.00)
GFR calc Af Amer: 115 mL/min/{1.73_m2} (ref >59)
GFR calc non Af Amer: 100 mL/min/{1.73_m2} (ref >59)
Globulin, Total: 2.3 g/dL (ref 1.5–4.5)
Glucose: 101 mg/dL — ABNORMAL HIGH (ref 65–99)
Potassium: 4.1 mmol/L (ref 3.5–5.2)
Sodium: 141 mmol/L (ref 134–144)
Total Protein: 6.5 g/dL (ref 6.0–8.5)

## 2021-01-09 LAB — ACTH: C206 ACTH: 9 pg/mL (ref 6–50)

## 2021-01-18 ENCOUNTER — Telehealth: Payer: Self-pay

## 2021-01-18 DIAGNOSIS — R42 Dizziness and giddiness: Secondary | ICD-10-CM | POA: Diagnosis not present

## 2021-01-18 DIAGNOSIS — R001 Bradycardia, unspecified: Secondary | ICD-10-CM | POA: Diagnosis not present

## 2021-01-18 DIAGNOSIS — R002 Palpitations: Secondary | ICD-10-CM | POA: Diagnosis not present

## 2021-01-18 DIAGNOSIS — F17210 Nicotine dependence, cigarettes, uncomplicated: Secondary | ICD-10-CM | POA: Diagnosis not present

## 2021-01-18 DIAGNOSIS — Z1231 Encounter for screening mammogram for malignant neoplasm of breast: Secondary | ICD-10-CM | POA: Diagnosis not present

## 2021-01-18 NOTE — Telephone Encounter (Signed)
Called pt back. Pt states she has someone coming to take her to hospital. She was in the process of getting ready.   Royce Macadamia, [place] 01/18/21 10:29 AM

## 2021-01-18 NOTE — Telephone Encounter (Signed)
Pt called stating she had a migraine last night that tyelnol was not helping. She took BP and it was 84/60. She did lay down and fell asleep, where her child tried to wake her up and she would not wake up. She did have sweats and dizziness. This morning she woke up with dizziness and BP was 92/65 when checked. Also checked BG and it was 120. CMA made sure children were taken care of, pt states he is at school. Pt home alone. CMA advised she go to hospital due to BP being low and symptoms. Pt states she just has mom and sister she would reach out to sister. Pt was advised not to drive herself. Pt VU and will call back if cannot get sister or mother. Will call 911 if cannot get family.   Royce Macadamia, [place] 01/18/21 10:12 AM

## 2021-01-19 ENCOUNTER — Telehealth: Payer: Self-pay | Admitting: Cardiology

## 2021-01-19 NOTE — Telephone Encounter (Signed)
Patient states she was referred by [facility] ED to see Dr. Harriet Masson. I did not see a referral in the system. She states they were supposed to cc the office so she can schedule.

## 2021-01-23 ENCOUNTER — Other Ambulatory Visit: Payer: Self-pay | Admitting: Physician Assistant

## 2021-01-23 DIAGNOSIS — N6320 Unspecified lump in the left breast, unspecified quadrant: Secondary | ICD-10-CM

## 2021-01-26 ENCOUNTER — Encounter: Payer: Self-pay | Admitting: Physician Assistant

## 2021-01-26 ENCOUNTER — Ambulatory Visit (INDEPENDENT_AMBULATORY_CARE_PROVIDER_SITE_OTHER): Payer: BC Managed Care – PPO | Admitting: Physician Assistant

## 2021-01-26 ENCOUNTER — Other Ambulatory Visit: Payer: Self-pay

## 2021-01-26 VITALS — BP 98/62 | HR 85 | Temp 97.5°F | Ht 66.0 in | Wt 115.0 lb

## 2021-01-26 DIAGNOSIS — R002 Palpitations: Secondary | ICD-10-CM

## 2021-01-26 DIAGNOSIS — M25411 Effusion, right shoulder: Secondary | ICD-10-CM | POA: Diagnosis not present

## 2021-01-26 DIAGNOSIS — M6281 Muscle weakness (generalized): Secondary | ICD-10-CM | POA: Diagnosis not present

## 2021-01-26 DIAGNOSIS — R3589 Other polyuria: Secondary | ICD-10-CM | POA: Diagnosis not present

## 2021-01-26 DIAGNOSIS — M25611 Stiffness of right shoulder, not elsewhere classified: Secondary | ICD-10-CM | POA: Diagnosis not present

## 2021-01-26 DIAGNOSIS — M25511 Pain in right shoulder: Secondary | ICD-10-CM | POA: Diagnosis not present

## 2021-01-26 DIAGNOSIS — N6320 Unspecified lump in the left breast, unspecified quadrant: Secondary | ICD-10-CM

## 2021-01-26 DIAGNOSIS — R634 Abnormal weight loss: Secondary | ICD-10-CM

## 2021-01-26 HISTORY — DX: Palpitations: R00.2

## 2021-01-26 HISTORY — DX: Unspecified lump in the left breast, unspecified quadrant: N63.20

## 2021-01-26 LAB — POCT URINALYSIS DIP (CLINITEK)
Bilirubin, UA: NEGATIVE
Blood, UA: NEGATIVE
Glucose, UA: NEGATIVE mg/dL
Ketones, POC UA: NEGATIVE mg/dL
Leukocytes, UA: NEGATIVE
Nitrite, UA: NEGATIVE
POC PROTEIN,UA: NEGATIVE
Spec Grav, UA: 1.015 (ref 1.010–1.025)
Urobilinogen, UA: 0.2 E.U./dL
pH, UA: 7.5 (ref 5.0–8.0)

## 2021-01-26 NOTE — Progress Notes (Signed)
Subjective:  Patient ID: Sarah [name], female    DOB: 1976/02/18  Age: 44 y.o. MRN: [id number]  Chief Complaint  Patient presents with  . Palpitations    HPI  pt was recently seen at [facility] for palpitations - she states she has had intermittently over the past several months but on that particular day states her bp was 80s/60s and her son had trouble waking her up  Her EKG, head CT and labwork done at the hospital was all normal and she has been referred to cardiology for further evaluation - her appt is on March 17th  Pt states she is still having some issues with her weight - stating she is eating every few hours but eating extremely high sugar, high fat foods in order to not lose more weight - she has actually gained 2 pounds since last being seen Pt did make her appt with Dr Lyda Jester which is on March 16  Pt was found to have possible left breast mass on her screening mammogram on 2/23 -- she is being scheduled for a diagnostic mammogram and ultrasound  Pt states she has noted her urine to be dark and going more than usual over the past week No current outpatient medications on file prior to visit.   No current facility-administered medications on file prior to visit.   Past Medical History:  Diagnosis Date  . Scoliosis    Past Surgical History:  Procedure Laterality Date  . CERVICAL SPINE SURGERY    . TUBAL LIGATION      Family History  Problem Relation Age of Onset  . Heart disease Mother   . Hyperlipidemia Mother   . Anxiety disorder Sister   . GER disease Son   . Asthma Son   . GER disease Son   . Heart disease Son    Social History   Socioeconomic History  . Marital status: Single    Spouse name: Not on file  . Number of children: Not on file  . Years of education: Not on file  . Highest education level: Not on file  Occupational History  . Not on file  Tobacco Use  . Smoking status: Current Every Day Smoker    Packs/day: 0.50    Years:  10.00    Pack years: 5.00    Types: Cigarettes  . Smokeless tobacco: Never Used  Vaping Use  . Vaping Use: Never used  Substance and Sexual Activity  . Alcohol use: Not Currently  . Drug use: Never  . Sexual activity: Not Currently  Other Topics Concern  . Not on file  Social History Narrative  . Not on file   Social Determinants of Health   Financial Resource Strain: Not on file  Food Insecurity: Not on file  Transportation Needs: Not on file  Physical Activity: Not on file  Stress: Not on file  Social Connections: Not on file    Review of Systems CONSTITUTIONAL: see HPI E/N/T: Negative for ear pain, nasal congestion and sore throat.  CARDIOVASCULAR: see HPI RESPIRATORY: Negative for recent cough and dyspnea.  GASTROINTESTINAL:see HPI GU - see HPI MSK: Negative for arthralgias and myalgias.  INTEGUMENTARY: Negative for rash.  PSYCHIATRIC: Negative for sleep disturbance and to question depression screen.  Negative for depression, negative for anhedonia.       Objective:  BP 98/62 (BP Location: Left Arm, Patient Position: Sitting, Cuff Size: Normal)   Pulse 85   Temp (!) 97.5 F (36.4 C) (Temporal)  Ht 5\' 6"  (1.676 m)   Wt 115 lb (52.2 kg)   SpO2 99%   BMI 18.56 kg/m   BP/Weight 01/26/2021 06/10/9677 07/29/8100  Systolic BP 98 751 025  Diastolic BP 62 72 74  Wt. (Lbs) 115 113.8 113.8  BMI 18.56 18.37 18.37    Physical Exam PHYSICAL EXAM:   VS: BP 98/62 (BP Location: Left Arm, Patient Position: Sitting, Cuff Size: Normal)   Pulse 85   Temp (!) 97.5 F (36.4 C) (Temporal)   Ht 5\' 6"  (1.676 m)   Wt 115 lb (52.2 kg)   SpO2 99%   BMI 18.56 kg/m   GEN: Well nourished, well developed, in no acute distress - thin - smells of cat Cardiac: RRR; no murmurs, rubs, or gallops,no edema - no significant varicosities Respiratory:  normal respiratory rate and pattern with no distress - normal breath sounds with no rales, rhonchi, wheezes or rubs GI: normal bowel  sounds, no masses or tenderness Skin: warm and dry, no rash - cat scratches noted on arms Neuro:  Alert and Oriented x 3, Strength and sensation are intact - CN II-Xii grossly intact Psych: euthymic mood, appropriate affect and demeanor  Office Visit on 01/26/2021  Component Date Value Ref Range Status  . Glucose, UA 01/26/2021 negative  negative mg/dL Final  . Bilirubin, UA 01/26/2021 negative  negative Final  . Ketones, POC UA 01/26/2021 negative  negative mg/dL Final  . Spec Grav, UA 01/26/2021 1.015  1.010 - 1.025 Final  . Blood, UA 01/26/2021 negative  negative Final  . pH, UA 01/26/2021 7.5  5.0 - 8.0 Final  . POC PROTEIN,UA 01/26/2021 negative  negative, trace Final  . Urobilinogen, UA 01/26/2021 0.2  0.2 or 1.0 E.U./dL Final  . Nitrite, UA 01/26/2021 Negative  Negative Final  . Leukocytes, UA 01/26/2021 Negative  Negative Final    Diabetic Foot Exam - Simple   No data filed      Lab Results  Component Value Date   WBC 6.8 11/22/2020   HGB 14.3 11/22/2020   HCT 42.1 11/22/2020   PLT 229 11/22/2020   GLUCOSE 101 (H) 01/06/2021   ALT 23 01/06/2021   AST 22 01/06/2021   NA 141 01/06/2021   K 4.1 01/06/2021   CL 104 01/06/2021   CREATININE 0.74 01/06/2021   BUN 10 01/06/2021   CO2 22 01/06/2021   TSH 1.040 11/22/2020   INR 1.0 11/22/2020   HGBA1C 5.4 01/02/2021      Assessment & Plan:   1. Palpitations Follow up with cardiology as directed 2. Polyuria - POCT URINALYSIS DIP (CLINITEK)  3. Left breast mass Set up for diagnostic mammogram/ultrasound 4. Weight loss  follow up with GI as scheduled  No orders of the defined types were placed in this encounter.   Orders Placed This Encounter  Procedures  . POCT URINALYSIS DIP (CLINITEK)      Follow-up: Return in about 3 months (around 04/28/2021).  An After Visit Summary was printed and given to the patient.  Yetta Flock [facility] [phone]

## 2021-01-30 ENCOUNTER — Telehealth: Payer: Self-pay

## 2021-01-30 NOTE — Telephone Encounter (Signed)
Pt returned call. Pt states she finished eating and fell asleep. Son woke her up and stated she wasn't breathing. Said son said her chest wasn't moving.   Made pt appointment for Thursday.  Royce Macadamia, [place] 01/30/21 11:40 AM

## 2021-01-30 NOTE — Telephone Encounter (Signed)
She does not need appointment with me --- she needs to discuss this with the cardiologist that she just saw

## 2021-01-30 NOTE — Telephone Encounter (Signed)
Pt left VM stating she thinks she needs a sleep study. Stating her son woke her up Saturday while she was asleep on the couch and told her she stopped breathing.   Attempted to return call to pt. No answer, could not leave VM as mailbox was full. Pt needs appointment for referral if needed.   Royce Macadamia, [place] 01/30/21 9:12 AM

## 2021-01-31 NOTE — Telephone Encounter (Signed)
Spoke with patient on 01/30/2021 explain to her that she needs to discuss this with cardiologist, also told her maybe call them tell them what is going and they will see her sooner if they can. I explain to her with everything she has going on the provider wants her to be check out by cardiologist first to make sure she is clear before moving forward with anything else.   Patient verbalized understanding, states she will call them and let them know what's going on.

## 2021-02-01 ENCOUNTER — Telehealth: Payer: Self-pay

## 2021-02-01 NOTE — Telephone Encounter (Signed)
   Sarah [name] has been scheduled for the following appointment:  WHAT: Diagnostic mammogram of left breast with ultrasound WHERE: [facility] out patient DATE: 02/16/2021   Pt is aware, hospital called to get correct order re-faxed over.

## 2021-02-02 ENCOUNTER — Ambulatory Visit: Payer: BC Managed Care – PPO | Admitting: Physician Assistant

## 2021-02-06 DIAGNOSIS — M419 Scoliosis, unspecified: Secondary | ICD-10-CM | POA: Insufficient documentation

## 2021-02-08 DIAGNOSIS — R634 Abnormal weight loss: Secondary | ICD-10-CM | POA: Diagnosis not present

## 2021-02-09 ENCOUNTER — Encounter: Payer: Self-pay | Admitting: Cardiology

## 2021-02-09 ENCOUNTER — Other Ambulatory Visit: Payer: Self-pay

## 2021-02-09 ENCOUNTER — Ambulatory Visit: Payer: BC Managed Care – PPO | Admitting: Cardiology

## 2021-02-09 ENCOUNTER — Ambulatory Visit (INDEPENDENT_AMBULATORY_CARE_PROVIDER_SITE_OTHER): Payer: BC Managed Care – PPO

## 2021-02-09 VITALS — BP 112/62 | HR 88 | Ht 66.0 in | Wt 116.0 lb

## 2021-02-09 DIAGNOSIS — R0602 Shortness of breath: Secondary | ICD-10-CM | POA: Diagnosis not present

## 2021-02-09 DIAGNOSIS — R9431 Abnormal electrocardiogram [ECG] [EKG]: Secondary | ICD-10-CM | POA: Diagnosis not present

## 2021-02-09 DIAGNOSIS — R001 Bradycardia, unspecified: Secondary | ICD-10-CM

## 2021-02-09 DIAGNOSIS — R400 Somnolence: Secondary | ICD-10-CM

## 2021-02-09 DIAGNOSIS — R5383 Other fatigue: Secondary | ICD-10-CM | POA: Diagnosis not present

## 2021-02-09 DIAGNOSIS — R002 Palpitations: Secondary | ICD-10-CM

## 2021-02-09 NOTE — Patient Instructions (Signed)
Medication Instructions:  Your physician recommends that you continue on your current medications as directed. Please refer to the Current Medication list given to you today.  *If you need a refill on your cardiac medications before your next appointment, please call your pharmacy*   Lab Work: Your physician recommends that you return for lab work today: vitamin d, vitamin b12 level  If you have labs (blood work) drawn today and your tests are completely normal, you will receive your results only by: Marland Kitchen MyChart Message (if you have MyChart) OR . A paper copy in the mail If you have any lab test that is abnormal or we need to change your treatment, we will call you to review the results.   Testing/Procedures: Your physician has requested that you have an echocardiogram. Echocardiography is a painless test that uses sound waves to create images of your heart. It provides your doctor with information about the size and shape of your heart and how well your heart's chambers and valves are working. This procedure takes approximately one hour. There are no restrictions for this procedure.  Your physician has recommended that you have a sleep study. This test records several body functions during sleep, including: brain activity, eye movement, oxygen and carbon dioxide blood levels, heart rate and rhythm, breathing rate and rhythm, the flow of air through your mouth and nose, snoring, body muscle movements, and chest and belly movement.  A zio monitor was ordered today. It will remain on for 14 days. You will then return monitor and event diary in provided box. It takes 1-2 weeks for report to be downloaded and returned to [place]. We will call you with the results. If monitor falls off or has orange flashing light, please call Zio for further instructions.      Follow-Up: At [facility], you and your health needs are our priority.  As part of our continuing mission to provide you with exceptional  heart care, we have created designated Provider Care Teams.  These Care Teams include your primary Cardiologist (physician) and Advanced Practice Providers (APPs -  Physician Assistants and Nurse Practitioners) who all work together to provide you with the care you need, when you need it.  We recommend signing up for the patient portal called "MyChart".  Sign up information is provided on this After Visit Summary.  MyChart is used to connect with patients for Virtual Visits (Telemedicine).  Patients are able to view lab/test results, encounter notes, upcoming appointments, etc.  Non-urgent messages can be sent to your provider as well.   To learn more about what you can do with MyChart, go to [email or web address].    Your next appointment:   3 month(s)  The format for your next appointment:   In Person  Provider:   Berniece Salines, DO   Other Instructions   Sleep Study, Adult A sleep study (polysomnogram) is a series of tests done while you are sleeping. A sleep study records your brain waves, heart rate, breathing rate, oxygen level, and eye and leg movements. A sleep study helps your health care provider:  See how well you sleep.  Diagnose a sleep disorder.  Determine how severe your sleep disorder is.  Create a plan to treat your sleep disorder. Your health care provider may recommend a sleep study if you:  Feel sleepy on most days.  Snore loudly while sleeping.  Have unusual behaviors while you sleep, such as walking.  Have brief periods in which you stop breathing during  sleep (sleepapnea).  Fall asleep suddenly during the day (narcolepsy).  Have trouble falling asleep or staying asleep (insomnia).  Feel like you need to move your legs when trying to fall asleep (restless legs syndrome).  Move your legs by flexing and extending them regularly while asleep (periodic limb movement disorder).  Act out your dreams while you sleep (sleep behavior disorder).  Feel like  you cannot move when you first wake up (sleep paralysis). What tests are part of a sleep study? Most sleep studies record the following during sleep:  Brain activity.  Eye movements.  Heart rate and rhythm.  Breathing rate and rhythm.  Blood-oxygen level.  Blood pressure.  Chest and belly movement as you breathe.  Arm and leg movements.  Snoring or other noises.  Body position. Where are sleep studies done? Sleep studies are done at sleep centers. A sleep center may be inside a hospital, office, or clinic. The room where you have the study may look like a hospital room or a hotel room. The health care providers doing the study may come in and out of the room during the study. Most of the time, they will be in another room monitoring your test as you sleep. How are sleep studies done? Most sleep studies are done during a normal period of time for a full night of sleep. You will arrive at the study center in the evening and go home in the morning. Before the test  Bring your pajamas and toothbrush with you to the sleep study.  Do not have caffeine on the day of your sleep study.  Do not drink alcohol on the day of your sleep study.  Your health care provider will let you know if you should stop taking any of your regular medicines before the test. During the test  Round, sticky patches with sensors attached to recording wires (electrodes) are placed on your scalp, face, chest, and limbs.  Wires from all the electrodes and sensors run from your bed to a computer. The wires can be taken off and put back on if you need to get out of bed to go to the bathroom.  A sensor is placed over your nose to measure airflow.  A finger clip is put on your finger or ear to measure your blood oxygen level (pulse oximetry).  A belt is placed around your belly and a belt is placed around your chest to measure breathing movements.  If you have signs of the sleep disorder called sleep apnea  during your test, you may get a treatment mask to wear for the second half of the night. ? The mask provides positive airway pressure (PAP) to help you breathe better during sleep. This may greatly improve your sleep apnea. ? You will then have all tests done again with the mask in place to see if your measurements and recordings change.      After the test  A medical doctor who specializes in sleep will evaluate the results of your sleep study and share them with you and your primary health care provider.  Based on your results, your medical history, and a physical exam, you may be diagnosed with a sleep disorder, such as: ? Sleep apnea. ? Restless legs syndrome. ? Sleep-related behavior disorder. ? Sleep-related movement disorders. ? Sleep-related seizure disorders.  Your health care team will help determine your treatment options based on your diagnosis. This may include: ? Improving your sleep habits (sleep hygiene). ? Wearing a continuous  positive airway pressure (CPAP) or bi-level positive airway pressure (BPAP) mask. ? Wearing an oral device at night to improve breathing and reduce snoring. ? Taking medicines. Follow these instructions at home:  Take over-the-counter and prescription medicines only as told by your health care provider.  If you are instructed to use a CPAP or BPAP mask, make sure you use it nightly as directed.  Make any lifestyle changes that your health care provider recommends.  If you were given a device to open your airway while you sleep, use it only as told by your health care provider.  Do not use any tobacco products, such as cigarettes, chewing tobacco, and e-cigarettes. If you need help quitting, ask your health care provider.  Keep all follow-up visits as told by your health care provider. This is important. Summary  A sleep study (polysomnogram) is a series of tests done while you are sleeping. It shows how well you sleep.  Most sleep studies  are done over one full night of sleep. You will arrive at the study center in the evening and go home in the morning.  If you have signs of the sleep disorder called sleep apnea during your test, you may get a treatment mask to wear for the second half of the night.  A medical doctor who specializes in sleep will evaluate the results of your sleep study and share them with your primary health care provider. This information is not intended to replace advice given to you by your health care provider. Make sure you discuss any questions you have with your health care provider. Document Revised: 12/18/2019 Document Reviewed: 12/10/2017 Elsevier Patient Education  2021 [employer].  Echocardiogram An echocardiogram is a test that uses sound waves (ultrasound) to produce images of the heart. Images from an echocardiogram can provide important information about:  Heart size and shape.  The size and thickness and movement of your heart's walls.  Heart muscle function and strength.  Heart valve function or if you have stenosis. Stenosis is when the heart valves are too narrow.  If blood is flowing backward through the heart valves (regurgitation).  A tumor or infectious growth around the heart valves.  Areas of heart muscle that are not working well because of poor blood flow or injury from a heart attack.  Aneurysm detection. An aneurysm is a weak or damaged part of an artery wall. The wall bulges out from the normal force of blood pumping through the body. Tell a health care provider about:  Any allergies you have.  All medicines you are taking, including vitamins, herbs, eye drops, creams, and over-the-counter medicines.  Any blood disorders you have.  Any surgeries you have had.  Any medical conditions you have.  Whether you are pregnant or may be pregnant. What are the risks? Generally, this is a safe test. However, problems may occur, including an allergic reaction to dye  (contrast) that may be used during the test. What happens before the test? No specific preparation is needed. You may eat and drink normally. What happens during the test?  You will take off your clothes from the waist up and put on a hospital gown.  Electrodes or electrocardiogram (ECG)patches may be placed on your chest. The electrodes or patches are then connected to a device that monitors your heart rate and rhythm.  You will lie down on a table for an ultrasound exam. A gel will be applied to your chest to help sound waves pass through  your skin.  A handheld device, called a transducer, will be pressed against your chest and moved over your heart. The transducer produces sound waves that travel to your heart and bounce back (or "echo" back) to the transducer. These sound waves will be captured in real-time and changed into images of your heart that can be viewed on a video monitor. The images will be recorded on a computer and reviewed by your health care provider.  You may be asked to change positions or hold your breath for a short time. This makes it easier to get different views or better views of your heart.  In some cases, you may receive contrast through an IV in one of your veins. This can improve the quality of the pictures from your heart. The procedure may vary among health care providers and hospitals.   What can I expect after the test? You may return to your normal, everyday life, including diet, activities, and medicines, unless your health care provider tells you not to do that. Follow these instructions at home:  It is up to you to get the results of your test. Ask your health care provider, or the department that is doing the test, when your results will be ready.  Keep all follow-up visits. This is important. Summary  An echocardiogram is a test that uses sound waves (ultrasound) to produce images of the heart.  Images from an echocardiogram can provide important  information about the size and shape of your heart, heart muscle function, heart valve function, and other possible heart problems.  You do not need to do anything to prepare before this test. You may eat and drink normally.  After the echocardiogram is completed, you may return to your normal, everyday life, unless your health care provider tells you not to do that. This information is not intended to replace advice given to you by your health care provider. Make sure you discuss any questions you have with your health care provider. Document Revised: 07/05/2020 Document Reviewed: 07/05/2020 Elsevier Patient Education  2021 [employer].

## 2021-02-09 NOTE — Progress Notes (Signed)
Cardiology Office Note:    Date:  02/09/2021   ID:  Sarah [name], DOB 25-Dec-1975, MRN [id number]  PCP:  Marge Duncans, PA-C  Cardiologist:  Berniece Salines, DO  Electrophysiologist:  None   Referring MD: Marge Duncans, PA-C   Chief Complaint  Patient presents with  . Low BP  . Palpitations  . Shortness of Breath  . Sleeping Issues    Son found her not breathing while sleeping    History of Present Illness:    Sarah [name] is a 45 y.o. female with a hx of possible left breast mass which she is being followed by GYN and is planning a diagnostic mammogram and ultrasound, the patient was referred by her primary care provider after she reported that she has been experiencing intermittent palpitations and was also noted to have tachycardia at her emergency department visit recently.  She tells me over the last few months she has had worsening fatigue with daytime somnolence.  But what really is the problem is she is feeling significant palpitations work during which time she feels an abrupt onset of fast heartbeat makes her feel dizzy when this is going on.  She also has associated shortness of breath and chest pain when she has these palpitations.  Nothing makes it better or worse.  It starts abruptly and offset is also abrupt.  She has been concerned with this fatigue because when she wakes up she feels as if she has not slept and she feels during the day she can go back to bed and is really tired.  This is affecting her work.  Past Medical History:  Diagnosis Date  . Hypercalcemia 01/06/2021  . Left breast mass 01/26/2021  . Malaise 01/06/2021  . Need for tetanus, diphtheria, and acellular pertussis (Tdap) vaccine 01/06/2021  . Palpitations 01/26/2021  . Polyuria 01/06/2021  . Scoliosis   . Weight loss 01/02/2021    Past Surgical History:  Procedure Laterality Date  . CERVICAL SPINE SURGERY    . Left fooscrew and plate x2t     . TUBAL LIGATION      Current Medications: Current Meds   Medication Sig  . famotidine (PEPCID) 40 MG tablet Take 40 mg by mouth daily.     Allergies:   Mobic [meloxicam]   Social History   Socioeconomic History  . Marital status: Single    Spouse name: Not on file  . Number of children: Not on file  . Years of education: Not on file  . Highest education level: Not on file  Occupational History  . Not on file  Tobacco Use  . Smoking status: Current Every Day Smoker    Packs/day: 0.50    Years: 10.00    Pack years: 5.00    Types: Cigarettes  . Smokeless tobacco: Never Used  Vaping Use  . Vaping Use: Never used  Substance and Sexual Activity  . Alcohol use: Not Currently  . Drug use: Never  . Sexual activity: Not Currently  Other Topics Concern  . Not on file  Social History Narrative  . Not on file   Social Determinants of Health   Financial Resource Strain: Not on file  Food Insecurity: Not on file  Transportation Needs: Not on file  Physical Activity: Not on file  Stress: Not on file  Social Connections: Not on file     Family History: The patient's family history includes Anxiety disorder in her sister; Asthma in her son; GER disease in her son  and son; Heart disease in her mother and son; Hyperlipidemia in her mother.  ROS:   Review of Systems  Constitution: Negative for decreased appetite, fever and weight gain.  HENT: Negative for congestion, ear discharge, hoarse voice and sore throat.   Eyes: Negative for discharge, redness, vision loss in right eye and visual halos.  Cardiovascular: Negative for chest pain, dyspnea on exertion, leg swelling, orthopnea and palpitations.  Respiratory: Negative for cough, hemoptysis, shortness of breath and snoring.   Endocrine: Negative for heat intolerance and polyphagia.  Hematologic/Lymphatic: Negative for bleeding problem. Does not bruise/bleed easily.  Skin: Negative for flushing, nail changes, rash and suspicious lesions.  Musculoskeletal: Negative for arthritis, joint  pain, muscle cramps, myalgias, neck pain and stiffness.  Gastrointestinal: Negative for abdominal pain, bowel incontinence, diarrhea and excessive appetite.  Genitourinary: Negative for decreased libido, genital sores and incomplete emptying.  Neurological: Negative for brief paralysis, focal weakness, headaches and loss of balance.  Psychiatric/Behavioral: Negative for altered mental status, depression and suicidal ideas.  Allergic/Immunologic: Negative for HIV exposure and persistent infections.    EKGs/Labs/Other Studies Reviewed:    The following studies were reviewed today:   EKG:  The ekg ordered today demonstrates sinus rhythm, heart rate 88 bpm with left atrial enlargement  Recent Labs: 11/22/2020: Hemoglobin 14.3; Platelets 229; TSH 1.040 01/06/2021: ALT 23; BUN 10; Creatinine, Ser 0.74; Potassium 4.1; Sodium 141  Recent Lipid Panel No results found for: CHOL, TRIG, HDL, CHOLHDL, VLDL, LDLCALC, LDLDIRECT  Physical Exam:    VS:  BP 112/62 (BP Location: Left Arm, Patient Position: Sitting)   Pulse 88   Ht 5\' 6"  (1.676 m)   Wt 116 lb (52.6 kg)   SpO2 95%   BMI 18.72 kg/m     Wt Readings from Last 3 Encounters:  02/09/21 116 lb (52.6 kg)  01/26/21 115 lb (52.2 kg)  01/06/21 113 lb 12.8 oz (51.6 kg)     GEN: Well nourished, well developed in no acute distress HEENT: Normal NECK: No JVD; No carotid bruits LYMPHATICS: No lymphadenopathy CARDIAC: S1S2 noted,RRR, no murmurs, rubs, gallops RESPIRATORY:  Clear to auscultation without rales, wheezing or rhonchi  ABDOMEN: Soft, non-tender, non-distended, +bowel sounds, no guarding. EXTREMITIES: No edema, No cyanosis, no clubbing MUSCULOSKELETAL:  No deformity  SKIN: Warm and dry NEUROLOGIC:  Alert and oriented x 3, non-focal PSYCHIATRIC:  Normal affect, good insight  ASSESSMENT:    1. Shortness of breath   2. Daytime somnolence   3. Fatigue, unspecified type   4. Abnormal EKG   5. Palpitations   6. Bradycardia     PLAN:    I would like to rule out a cardiovascular etiology of this palpitation along with dizziness, therefore at this time I would like to placed a zio patch for 14 days. In additon her EKG showed evidence of left atrial enlargement and her shortness of breath a transthoracic echocardiogram will be ordered to assess LV/RV function and any structural abnormalities.  For her daytime somnolence and fatigue I like to refer the patient for sleep study to rule out sleep apnea at this time.  We will also get vitamin D levels as well as B12 level to make sure these deficiencies are not playing a role.   Once these testing have been performed amd reviewed further reccomendations will be made. For now, I do reccomend that the patient goes to the nearest ED if  symptoms recur.  The patient is in agreement with the above plan. The patient left  the office in stable condition.  The patient will follow up in 3 months or sooner if needed.   Medication Adjustments/Labs and Tests Ordered: Current medicines are reviewed at length with the patient today.  Concerns regarding medicines are outlined above.  Orders Placed This Encounter  Procedures  . Vitamin D 1,25 dihydroxy  . B12  . LONG TERM MONITOR (3-14 DAYS)  . EKG 12-Lead  . ECHOCARDIOGRAM COMPLETE  . Split night study   No orders of the defined types were placed in this encounter.   Patient Instructions   Medication Instructions:  Your physician recommends that you continue on your current medications as directed. Please refer to the Current Medication list given to you today.  *If you need a refill on your cardiac medications before your next appointment, please call your pharmacy*   Lab Work: Your physician recommends that you return for lab work today: vitamin d, vitamin b12 level  If you have labs (blood work) drawn today and your tests are completely normal, you will receive your results only by: Marland Kitchen MyChart Message (if you have  MyChart) OR . A paper copy in the mail If you have any lab test that is abnormal or we need to change your treatment, we will call you to review the results.   Testing/Procedures: Your physician has requested that you have an echocardiogram. Echocardiography is a painless test that uses sound waves to create images of your heart. It provides your doctor with information about the size and shape of your heart and how well your heart's chambers and valves are working. This procedure takes approximately one hour. There are no restrictions for this procedure.  Your physician has recommended that you have a sleep study. This test records several body functions during sleep, including: brain activity, eye movement, oxygen and carbon dioxide blood levels, heart rate and rhythm, breathing rate and rhythm, the flow of air through your mouth and nose, snoring, body muscle movements, and chest and belly movement.  A zio monitor was ordered today. It will remain on for 14 days. You will then return monitor and event diary in provided box. It takes 1-2 weeks for report to be downloaded and returned to [place]. We will call you with the results. If monitor falls off or has orange flashing light, please call Zio for further instructions.      Follow-Up: At [facility], you and your health needs are our priority.  As part of our continuing mission to provide you with exceptional heart care, we have created designated Provider Care Teams.  These Care Teams include your primary Cardiologist (physician) and Advanced Practice Providers (APPs -  Physician Assistants and Nurse Practitioners) who all work together to provide you with the care you need, when you need it.  We recommend signing up for the patient portal called "MyChart".  Sign up information is provided on this After Visit Summary.  MyChart is used to connect with patients for Virtual Visits (Telemedicine).  Patients are able to view lab/test results,  encounter notes, upcoming appointments, etc.  Non-urgent messages can be sent to your provider as well.   To learn more about what you can do with MyChart, go to [email or web address].    Your next appointment:   3 month(s)  The format for your next appointment:   In Person  Provider:   Berniece Salines, DO   Other Instructions   Sleep Study, Adult A sleep study (polysomnogram) is a series of tests done while you  are sleeping. A sleep study records your brain waves, heart rate, breathing rate, oxygen level, and eye and leg movements. A sleep study helps your health care provider:  See how well you sleep.  Diagnose a sleep disorder.  Determine how severe your sleep disorder is.  Create a plan to treat your sleep disorder. Your health care provider may recommend a sleep study if you:  Feel sleepy on most days.  Snore loudly while sleeping.  Have unusual behaviors while you sleep, such as walking.  Have brief periods in which you stop breathing during sleep (sleepapnea).  Fall asleep suddenly during the day (narcolepsy).  Have trouble falling asleep or staying asleep (insomnia).  Feel like you need to move your legs when trying to fall asleep (restless legs syndrome).  Move your legs by flexing and extending them regularly while asleep (periodic limb movement disorder).  Act out your dreams while you sleep (sleep behavior disorder).  Feel like you cannot move when you first wake up (sleep paralysis). What tests are part of a sleep study? Most sleep studies record the following during sleep:  Brain activity.  Eye movements.  Heart rate and rhythm.  Breathing rate and rhythm.  Blood-oxygen level.  Blood pressure.  Chest and belly movement as you breathe.  Arm and leg movements.  Snoring or other noises.  Body position. Where are sleep studies done? Sleep studies are done at sleep centers. A sleep center may be inside a hospital, office, or clinic. The  room where you have the study may look like a hospital room or a hotel room. The health care providers doing the study may come in and out of the room during the study. Most of the time, they will be in another room monitoring your test as you sleep. How are sleep studies done? Most sleep studies are done during a normal period of time for a full night of sleep. You will arrive at the study center in the evening and go home in the morning. Before the test  Bring your pajamas and toothbrush with you to the sleep study.  Do not have caffeine on the day of your sleep study.  Do not drink alcohol on the day of your sleep study.  Your health care provider will let you know if you should stop taking any of your regular medicines before the test. During the test  Round, sticky patches with sensors attached to recording wires (electrodes) are placed on your scalp, face, chest, and limbs.  Wires from all the electrodes and sensors run from your bed to a computer. The wires can be taken off and put back on if you need to get out of bed to go to the bathroom.  A sensor is placed over your nose to measure airflow.  A finger clip is put on your finger or ear to measure your blood oxygen level (pulse oximetry).  A belt is placed around your belly and a belt is placed around your chest to measure breathing movements.  If you have signs of the sleep disorder called sleep apnea during your test, you may get a treatment mask to wear for the second half of the night. ? The mask provides positive airway pressure (PAP) to help you breathe better during sleep. This may greatly improve your sleep apnea. ? You will then have all tests done again with the mask in place to see if your measurements and recordings change.      After the test  A medical doctor who specializes in sleep will evaluate the results of your sleep study and share them with you and your primary health care provider.  Based on your  results, your medical history, and a physical exam, you may be diagnosed with a sleep disorder, such as: ? Sleep apnea. ? Restless legs syndrome. ? Sleep-related behavior disorder. ? Sleep-related movement disorders. ? Sleep-related seizure disorders.  Your health care team will help determine your treatment options based on your diagnosis. This may include: ? Improving your sleep habits (sleep hygiene). ? Wearing a continuous positive airway pressure (CPAP) or bi-level positive airway pressure (BPAP) mask. ? Wearing an oral device at night to improve breathing and reduce snoring. ? Taking medicines. Follow these instructions at home:  Take over-the-counter and prescription medicines only as told by your health care provider.  If you are instructed to use a CPAP or BPAP mask, make sure you use it nightly as directed.  Make any lifestyle changes that your health care provider recommends.  If you were given a device to open your airway while you sleep, use it only as told by your health care provider.  Do not use any tobacco products, such as cigarettes, chewing tobacco, and e-cigarettes. If you need help quitting, ask your health care provider.  Keep all follow-up visits as told by your health care provider. This is important. Summary  A sleep study (polysomnogram) is a series of tests done while you are sleeping. It shows how well you sleep.  Most sleep studies are done over one full night of sleep. You will arrive at the study center in the evening and go home in the morning.  If you have signs of the sleep disorder called sleep apnea during your test, you may get a treatment mask to wear for the second half of the night.  A medical doctor who specializes in sleep will evaluate the results of your sleep study and share them with your primary health care provider. This information is not intended to replace advice given to you by your health care provider. Make sure you discuss any  questions you have with your health care provider. Document Revised: 12/18/2019 Document Reviewed: 12/10/2017 Elsevier Patient Education  2021 [place].  Echocardiogram An echocardiogram is a test that uses sound waves (ultrasound) to produce images of the heart. Images from an echocardiogram can provide important information about:  Heart size and shape.  The size and thickness and movement of your heart's walls.  Heart muscle function and strength.  Heart valve function or if you have stenosis. Stenosis is when the heart valves are too narrow.  If blood is flowing backward through the heart valves (regurgitation).  A tumor or infectious growth around the heart valves.  Areas of heart muscle that are not working well because of poor blood flow or injury from a heart attack.  Aneurysm detection. An aneurysm is a weak or damaged part of an artery wall. The wall bulges out from the normal force of blood pumping through the body. Tell a health care provider about:  Any allergies you have.  All medicines you are taking, including vitamins, herbs, eye drops, creams, and over-the-counter medicines.  Any blood disorders you have.  Any surgeries you have had.  Any medical conditions you have.  Whether you are pregnant or may be pregnant. What are the risks? Generally, this is a safe test. However, problems may occur, including an allergic reaction to dye (contrast) that may be  used during the test. What happens before the test? No specific preparation is needed. You may eat and drink normally. What happens during the test?  You will take off your clothes from the waist up and put on a hospital gown.  Electrodes or electrocardiogram (ECG)patches may be placed on your chest. The electrodes or patches are then connected to a device that monitors your heart rate and rhythm.  You will lie down on a table for an ultrasound exam. A gel will be applied to your chest to help sound  waves pass through your skin.  A handheld device, called a transducer, will be pressed against your chest and moved over your heart. The transducer produces sound waves that travel to your heart and bounce back (or "echo" back) to the transducer. These sound waves will be captured in real-time and changed into images of your heart that can be viewed on a video monitor. The images will be recorded on a computer and reviewed by your health care provider.  You may be asked to change positions or hold your breath for a short time. This makes it easier to get different views or better views of your heart.  In some cases, you may receive contrast through an IV in one of your veins. This can improve the quality of the pictures from your heart. The procedure may vary among health care providers and hospitals.   What can I expect after the test? You may return to your normal, everyday life, including diet, activities, and medicines, unless your health care provider tells you not to do that. Follow these instructions at home:  It is up to you to get the results of your test. Ask your health care provider, or the department that is doing the test, when your results will be ready.  Keep all follow-up visits. This is important. Summary  An echocardiogram is a test that uses sound waves (ultrasound) to produce images of the heart.  Images from an echocardiogram can provide important information about the size and shape of your heart, heart muscle function, heart valve function, and other possible heart problems.  You do not need to do anything to prepare before this test. You may eat and drink normally.  After the echocardiogram is completed, you may return to your normal, everyday life, unless your health care provider tells you not to do that. This information is not intended to replace advice given to you by your health care provider. Make sure you discuss any questions you have with your health care  provider. Document Revised: 07/05/2020 Document Reviewed: 07/05/2020 Elsevier Patient Education  2021 [place].      Adopting a Healthy Lifestyle.  Know what a healthy weight is for you (roughly BMI <25) and aim to maintain this   Aim for 7+ servings of fruits and vegetables daily   65-80+ fluid ounces of water or unsweet tea for healthy kidneys   Limit to max 1 drink of alcohol per day; avoid smoking/tobacco   Limit animal fats in diet for cholesterol and heart health - choose grass fed whenever available   Avoid highly processed foods, and foods high in saturated/trans fats   Aim for low stress - take time to unwind and care for your mental health   Aim for 150 min of moderate intensity exercise weekly for heart health, and weights twice weekly for bone health   Aim for 7-9 hours of sleep daily   When it comes to diets, agreement  about the perfect plan isnt easy to find, even among the experts. Experts at the [employer] developed an idea known as the Healthy Eating Plate. Just imagine a plate divided into logical, healthy portions.   The emphasis is on diet quality:   Load up on vegetables and fruits - one-half of your plate: Aim for color and variety, and remember that potatoes dont count.   Go for whole grains - one-quarter of your plate: Whole wheat, barley, wheat berries, quinoa, oats, brown rice, and foods made with them. If you want pasta, go with whole wheat pasta.   Protein power - one-quarter of your plate: Fish, chicken, beans, and nuts are all healthy, versatile protein sources. Limit red meat.   The diet, however, does go beyond the plate, offering a few other suggestions.   Use healthy plant oils, such as olive, canola, soy, corn, sunflower and peanut. Check the labels, and avoid partially hydrogenated oil, which have unhealthy trans fats.   If youre thirsty, drink water. Coffee and tea are good in moderation, but skip sugary  drinks and limit milk and dairy products to one or two daily servings.   The type of carbohydrate in the diet is more important than the amount. Some sources of carbohydrates, such as vegetables, fruits, whole grains, and beans-are healthier than others.   Finally, stay active  Signed, Berniece Salines, DO  02/09/2021 12:47 PM    [facility]

## 2021-02-10 ENCOUNTER — Telehealth: Payer: Self-pay | Admitting: *Deleted

## 2021-02-10 ENCOUNTER — Telehealth: Payer: Self-pay | Admitting: Cardiology

## 2021-02-10 DIAGNOSIS — R109 Unspecified abdominal pain: Secondary | ICD-10-CM | POA: Diagnosis not present

## 2021-02-10 NOTE — Telephone Encounter (Signed)
Staff message sent to [facility] ok to schedule HST. BCBS denied in lab study  [place] #[id number]. Valid dates 02/10/21 to 04/10/21.

## 2021-02-10 NOTE — Telephone Encounter (Signed)
Patients split night has been denied, HST would be approved if we would like to try that. Please advise.

## 2021-02-10 NOTE — Telephone Encounter (Signed)
That will be fine. 

## 2021-02-14 NOTE — Telephone Encounter (Signed)
Tried to call sleep lab to schedule no answer.

## 2021-02-17 ENCOUNTER — Telehealth: Payer: Self-pay | Admitting: Cardiology

## 2021-02-17 NOTE — Telephone Encounter (Signed)
Incomplete FMLA forms dropped off by Juline Patch for personal pick up and requested fax to [place] [phone]) received on 02/17/2021. Complete pt auth attached and scanned into documents. Took form to [employer] for completion.  Paid with check/kbl

## 2021-02-21 LAB — VITAMIN B12: Vitamin B-12: 1142 pg/mL (ref 232–1245)

## 2021-02-21 LAB — VITAMIN D 1,25 DIHYDROXY
Vitamin D 1, 25 (OH)2 Total: 21 pg/mL
Vitamin D2 1, 25 (OH)2: <10 pg/mL
Vitamin D3 1, 25 (OH)2: 19 pg/mL

## 2021-02-22 ENCOUNTER — Other Ambulatory Visit: Payer: Self-pay

## 2021-02-22 ENCOUNTER — Telehealth: Payer: Self-pay | Admitting: Cardiology

## 2021-02-22 DIAGNOSIS — R400 Somnolence: Secondary | ICD-10-CM

## 2021-02-22 DIAGNOSIS — R5383 Other fatigue: Secondary | ICD-10-CM

## 2021-02-22 NOTE — Telephone Encounter (Signed)
RN spoke to patient regarding results. RN reviewed labs with patient and answered questions. Patient inquiring about sleep study discussed at last visit, as well as picking up FMLA paperwork that she dropped off on 3/25 at the office. RN advised I would send a message to Dr. Harriet Masson and her nurse. Patient verbalized understanding.    Message sent to Dr.Tobb and Delana Meyer, Therapist, sports.

## 2021-02-22 NOTE — Telephone Encounter (Signed)
Patient returning Jasmine's call.

## 2021-02-24 ENCOUNTER — Ambulatory Visit: Payer: BC Managed Care – PPO | Admitting: Cardiology

## 2021-02-26 DIAGNOSIS — M6281 Muscle weakness (generalized): Secondary | ICD-10-CM | POA: Diagnosis not present

## 2021-02-26 DIAGNOSIS — M25411 Effusion, right shoulder: Secondary | ICD-10-CM | POA: Diagnosis not present

## 2021-02-26 DIAGNOSIS — M25511 Pain in right shoulder: Secondary | ICD-10-CM | POA: Diagnosis not present

## 2021-02-26 DIAGNOSIS — M25611 Stiffness of right shoulder, not elsewhere classified: Secondary | ICD-10-CM | POA: Diagnosis not present

## 2021-03-01 ENCOUNTER — Ambulatory Visit (INDEPENDENT_AMBULATORY_CARE_PROVIDER_SITE_OTHER): Payer: BC Managed Care – PPO

## 2021-03-01 ENCOUNTER — Other Ambulatory Visit: Payer: Self-pay

## 2021-03-01 DIAGNOSIS — R9431 Abnormal electrocardiogram [ECG] [EKG]: Secondary | ICD-10-CM | POA: Diagnosis not present

## 2021-03-01 DIAGNOSIS — R0602 Shortness of breath: Secondary | ICD-10-CM

## 2021-03-01 DIAGNOSIS — R001 Bradycardia, unspecified: Secondary | ICD-10-CM

## 2021-03-01 DIAGNOSIS — R002 Palpitations: Secondary | ICD-10-CM | POA: Diagnosis not present

## 2021-03-01 LAB — ECHOCARDIOGRAM COMPLETE
Area-P 1/2: 3.39 cm2
S' Lateral: 3 cm

## 2021-03-01 NOTE — Progress Notes (Signed)
Complete echocardiogram performed.  Jimmy  RDCS, RVT  

## 2021-03-02 ENCOUNTER — Telehealth: Payer: Self-pay

## 2021-03-02 NOTE — Telephone Encounter (Signed)
Spoke with patient regarding results and recommendation.  Patient verbalizes understanding and is agreeable to plan of care. Advised patient to call back with any issues or concerns.  

## 2021-03-02 NOTE — Telephone Encounter (Signed)
-----   Message from Berniece Salines, DO sent at 02/21/2021  4:45 PM EDT ----- Vitamin D normal

## 2021-03-02 NOTE — Telephone Encounter (Signed)
Patient returning call.

## 2021-03-02 NOTE — Telephone Encounter (Signed)
Tried calling patient. No answer and no voicemail set up for me to leave a message. 

## 2021-03-03 ENCOUNTER — Other Ambulatory Visit: Payer: Self-pay

## 2021-03-03 ENCOUNTER — Telehealth: Payer: Self-pay | Admitting: Cardiology

## 2021-03-03 ENCOUNTER — Telehealth: Payer: Self-pay

## 2021-03-03 DIAGNOSIS — R400 Somnolence: Secondary | ICD-10-CM

## 2021-03-03 NOTE — Telephone Encounter (Signed)
HST APPROVED ORDER ID: [id number] VALID FROM 03/02/21 - 08/29/21

## 2021-03-03 NOTE — Telephone Encounter (Signed)
    Pt called back, advised the time and date for her sleep test. She said if Delana Meyer has other questions to call her back

## 2021-03-03 NOTE — Telephone Encounter (Signed)
Tried calling patient about her sleep study on May 16th at 1 pm. No answer, no voice-mailbox is full can't leave a message.

## 2021-03-06 NOTE — Telephone Encounter (Signed)
Sarah [name] spoke with patient in regards to her sleep study.

## 2021-03-07 ENCOUNTER — Encounter: Payer: Self-pay | Admitting: Cardiology

## 2021-03-07 ENCOUNTER — Telehealth (INDEPENDENT_AMBULATORY_CARE_PROVIDER_SITE_OTHER): Payer: BC Managed Care – PPO | Admitting: Cardiology

## 2021-03-07 VITALS — BP 117/66 | Ht 66.0 in | Wt 117.0 lb

## 2021-03-07 DIAGNOSIS — R922 Inconclusive mammogram: Secondary | ICD-10-CM | POA: Diagnosis not present

## 2021-03-07 DIAGNOSIS — Z720 Tobacco use: Secondary | ICD-10-CM | POA: Diagnosis not present

## 2021-03-07 DIAGNOSIS — R931 Abnormal findings on diagnostic imaging of heart and coronary circulation: Secondary | ICD-10-CM

## 2021-03-07 DIAGNOSIS — R0789 Other chest pain: Secondary | ICD-10-CM

## 2021-03-07 DIAGNOSIS — R600 Localized edema: Secondary | ICD-10-CM | POA: Diagnosis not present

## 2021-03-07 DIAGNOSIS — N6321 Unspecified lump in the left breast, upper outer quadrant: Secondary | ICD-10-CM | POA: Diagnosis not present

## 2021-03-07 DIAGNOSIS — N6012 Diffuse cystic mastopathy of left breast: Secondary | ICD-10-CM | POA: Diagnosis not present

## 2021-03-07 MED ORDER — FUROSEMIDE 20 MG PO TABS: 20.0000 mg | ORAL_TABLET | ORAL | 3 refills | Status: DC

## 2021-03-07 MED ORDER — POTASSIUM CHLORIDE ER 10 MEQ PO TBCR: 10.0000 meq | EXTENDED_RELEASE_TABLET | ORAL | 3 refills | Status: DC

## 2021-03-07 NOTE — Progress Notes (Addendum)
Telehealth video visit  Date:  03/08/2021   ID:  Sarah [name], DOB [birth year], MRN [id number]  The patient is at home and is agreeable for this video visit and consents after verifying this is a patient with 2 personal identifiers. I am in the office   PCP:  Marge Duncans, PA-C  Cardiologist:  Berniece Salines, DO  Electrophysiologist:  None   Evaluation Performed:  Follow up visit   Chief Complaint:  " I am having chest pain  History of Present Illness:    Sarah [name] is a 45 y.o. female with with a left breast mass is pending diagnostic mammogram and ultrasound, will initially presented to be evaluated for palpitation and shortness of breath.  At that time I recommended patient undergo an echocardiogram as well as event monitor.  She did have daytime somnolence and fatigue which I ordered a sleep study.  Unfortunately her in lab sleep study has been canceled due to insurance not covering this and recommend home sleep study.  She had her echocardiogram done EF normal however there was concern for pattern suggesting cardiac amyloid.  Her monitor was on remarkable.  The patient is here today for follow-up visit due to her abnormal echocardiogram.  She notes that she is experiencing some tightness on the left side of the chest around where the left breast masses.  Terms of her chest pain she tells me sometimes this is midsternal, pressure-like which will radiate to her breasts.  She has associated shortness of breath.  Is concerned about this.  She also tells me that she has bilateral leg edema and this is new.  The patient does not have symptoms concerning for COVID-19 infection.  Past Medical History:  Diagnosis Date  . Hypercalcemia 01/06/2021  . Left breast mass 01/26/2021  . Malaise 01/06/2021  . Need for tetanus, diphtheria, and acellular pertussis (Tdap) vaccine 01/06/2021  . Palpitations 01/26/2021  . Polyuria 01/06/2021  . Scoliosis   . Weight loss 01/02/2021   Past Surgical  History:  Procedure Laterality Date  . CERVICAL SPINE SURGERY    . Left fooscrew and plate x2t     . TUBAL LIGATION       Current Meds  Medication Sig  . famotidine (PEPCID) 40 MG tablet Take 40 mg by mouth daily.  . furosemide (LASIX) 20 MG tablet Take 1 tablet (20 mg total) by mouth every other day.  . potassium chloride (KLOR-CON) 10 MEQ tablet Take 1 tablet (10 mEq total) by mouth every other day.     Allergies:   Mobic [meloxicam]   Social History   Tobacco Use  . Smoking status: Current Every Day Smoker    Packs/day: 0.50    Years: 10.00    Pack years: 5.00    Types: Cigarettes  . Smokeless tobacco: Never Used  Vaping Use  . Vaping Use: Never used  Substance Use Topics  . Alcohol use: Not Currently  . Drug use: Never     Family Hx: The patient's family history includes Anxiety disorder in her sister; Asthma in her son; GER disease in her son and son; Heart disease in her mother and son; Hyperlipidemia in her mother.  ROS:   Please see the history of present illness.    Review of Systems  Constitution: Negative for decreased appetite, fever and weight gain.  HENT: Negative for congestion, ear discharge, hoarse voice and sore throat.   Eyes: Negative for discharge, redness, vision loss in right eye and visual  halos.  Cardiovascular: Reports bilateral leg swelling.  Negative for chest pain, dyspnea on exertion, l orthopnea and palpitations.  Respiratory: Negative for cough, hemoptysis, shortness of breath and snoring.   Endocrine: Negative for heat intolerance and polyphagia.  Hematologic/Lymphatic: Negative for bleeding problem. Does not bruise/bleed easily.  Skin: Negative for flushing, nail changes, rash and suspicious lesions.  Musculoskeletal: Negative for arthritis, joint pain, muscle cramps, myalgias, neck pain and stiffness.  Gastrointestinal: Negative for abdominal pain, bowel incontinence, diarrhea and excessive appetite.  Genitourinary: Negative for  decreased libido, genital sores and incomplete emptying.  Neurological: Negative for brief paralysis, focal weakness, headaches and loss of balance.  Psychiatric/Behavioral: Negative for altered mental status, depression and suicidal ideas.  Allergic/Immunologic: Negative for HIV exposure and persistent infections.    Prior CV studies:   The following studies were reviewed today: Echocardiogram March 01, 2021 IMPRESSIONS    1. Left ventricular ejection fraction, by estimation, is 60 to 65%. The  left ventricle has normal function. The left ventricle has no regional  wall motion abnormalities. Left ventricular diastolic parameters were  normal. Speckled pattern appreciated in  the wall of the interventricular septum. No evidence of left ventricular  hypertrophy.  2. The average left ventricular global longitudinal strain is abnormal (  -11.4 %). The strain pattern have the "cherry on the top" appearance.  Recommend further testing to rule out cardiac amyloid.  3. Right ventricular systolic function is normal. The right ventricular  size is normal. There is normal pulmonary artery systolic pressure.  4. The mitral valve is normal in structure. Trivial mitral valve  regurgitation. No evidence of mitral stenosis.  5. The aortic valve is normal in structure. Aortic valve regurgitation is  not visualized. No aortic stenosis is present.  6. The inferior vena cava is normal in size with greater than 50%  respiratory variability, suggesting right atrial pressure of 3 mmHg.   Comparison(s): No prior Echocardiogram.   FINDINGS  Left Ventricle: Left ventricular ejection fraction, by estimation, is 60  to 65%. The left ventricle has normal function. The left ventricle has no  regional wall motion abnormalities. The average left ventricular global  longitudinal strain is -11.4 %.  The global longitudinal strain is abnormal. The left ventricular internal  cavity size was normal in size.  There is no left ventricular hypertrophy.  Left ventricular diastolic parameters were normal.   Right Ventricle: The right ventricular size is normal. No increase in  right ventricular wall thickness. Right ventricular systolic function is  normal. There is normal pulmonary artery systolic pressure. The tricuspid  regurgitant velocity is 2.46 m/s, and  with an assumed right atrial pressure of 8 mmHg, the estimated right  ventricular systolic pressure is 71.6 mmHg.   Left Atrium: Left atrial size was normal in size.   Right Atrium: Right atrial size was normal in size.   Pericardium: There is no evidence of pericardial effusion.   Mitral Valve: The mitral valve is normal in structure. Trivial mitral  valve regurgitation. No evidence of mitral valve stenosis.   Tricuspid Valve: The tricuspid valve is normal in structure. Tricuspid  valve regurgitation is mild . No evidence of tricuspid stenosis.   Aortic Valve: The aortic valve is normal in structure. Aortic valve  regurgitation is not visualized. No aortic stenosis is present.   Pulmonic Valve: The pulmonic valve was normal in structure. Pulmonic valve  regurgitation is not visualized. No evidence of pulmonic stenosis.   Aorta: The aortic root is normal  in size and structure.   Venous: The inferior vena cava is normal in size with greater than 50%  respiratory variability, suggesting right atrial pressure of 3 mmHg.   IAS/Shunts: No atrial level shunt detected by color flow Doppler.    ZIO monitor The patient wore the monitor for 14 days starting February 09, 2021. Indication: Palpitations  The minimum heart rate was 49 bpm, maximum heart rate was 50 bpm, and average heart rate was 86 bpm. Predominant underlying rhythm was Sinus Rhythm.  Premature atrial complexes were rare less than 1%. Premature Ventricular complexes were rare less than 1%.  No ventricular tachycardia, no pauses, No AV block and no atrial fibrillation  present. No patient triggered events and no diary events are reported.      Conclusion: Normal/unremarkable study with no evidence of significant arrhythmia.     Labs/Other Tests and Data Reviewed:    EKG: None today  Recent Labs: 11/22/2020: Hemoglobin 14.3; Platelets 229; TSH 1.040 01/06/2021: ALT 23; BUN 10; Creatinine, Ser 0.74; Potassium 4.1; Sodium 141   Recent Lipid Panel No results found for: CHOL, TRIG, HDL, CHOLHDL, LDLCALC, LDLDIRECT  Wt Readings from Last 3 Encounters:  03/07/21 117 lb (53.1 kg)  02/09/21 116 lb (52.6 kg)  01/26/21 115 lb (52.2 kg)     Objective:    Vital Signs:  BP 117/66   Ht 5\' 6"  (1.676 m)   Wt 117 lb (53.1 kg)   BMI 18.88 kg/m    Unable to perform physical exam this is a virtual visit.  ASSESSMENT & PLAN:    1. Bilateral leg edema 2. Left-sided chest discomfort  3. Abnormal echocardiogram 4. Tobacco use   I did speak to the patient about her test results.  Her ZIO monitor is unremarkable.  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  What I would like to do is get blood work that will help [place] with the diagnosis of cardiac amyloidosis.  In addition to her continued chest discomfort and given the fact that the patient has risk factors for coronary artery disease I like to get a stress MRI in this patient which will be used with the help of answers; ruling out coronary artery disease as well as understanding cardiac amyloid.Marland Kitchen  4 bilateral leg edema I will start the patient on low-dose Lasix 20 mg every other day with potassium supplement.  Smoking cessation advised.  COVID-19 Education: The signs and symptoms of COVID-19 were discussed with the patient and how to seek care for testing (follow up with PCP or arrange E-visit). The importance of social distancing was discussed today.  Time:   Today, I have spent 11 minutes with the patient with telehealth technology discussing the above problems.     Medication  Adjustments/Labs and Tests Ordered: Current medicines are reviewed at length with the patient today.  Concerns regarding medicines are outlined above.   Tests Ordered: Orders Placed This Encounter  Procedures  . Serum protein electrophoresis with reflex  . Immunofixation, urine  . PE and FLC, Serum  . MYOCARDIAL AMYLOID IMAGING PLANAR AND SPECT    Medication Changes: Meds ordered this encounter  Medications  . furosemide (LASIX) 20 MG tablet    Sig: Take 1 tablet (20 mg total) by mouth every other day.    Dispense:  45 tablet    Refill:  3  . potassium chloride (KLOR-CON) 10 MEQ tablet    Sig: Take 1 tablet (10 mEq total) by mouth every other day.  Dispense:  45 tablet    Refill:  3    Follow Up: 8 weeks  Signed, Berniece Salines, DO  03/08/2021 8:07 AM    [place]

## 2021-03-07 NOTE — Patient Instructions (Signed)
Medication Instructions:  Your physician has recommended you make the following change in your medication: START: Lasix 20 mg every other day START: Potassium 10 meq every other day  *If you need a refill on your cardiac medications before your next appointment, please call your pharmacy*   Lab Work: Your physician recommends that you return for lab work: Amyloidosis labs If you have labs (blood work) drawn today and your tests are completely normal, you will receive your results only by: Marland Kitchen MyChart Message (if you have MyChart) OR . A paper copy in the mail If you have any lab test that is abnormal or we need to change your treatment, we will call you to review the results.   Testing/Procedures: None   Follow-Up: At [facility], you and your health needs are our priority.  As part of our continuing mission to provide you with exceptional heart care, we have created designated Provider Care Teams.  These Care Teams include your primary Cardiologist (physician) and Advanced Practice Providers (APPs -  Physician Assistants and Nurse Practitioners) who all work together to provide you with the care you need, when you need it.  We recommend signing up for the patient portal called "MyChart".  Sign up information is provided on this After Visit Summary.  MyChart is used to connect with patients for Virtual Visits (Telemedicine).  Patients are able to view lab/test results, encounter notes, upcoming appointments, etc.  Non-urgent messages can be sent to your provider as well.   To learn more about what you can do with MyChart, go to [email or web address].    Your next appointment:   8 week(s)  The format for your next appointment:   In Person  Provider:   Berniece Salines, DO   Other Instructions

## 2021-03-08 ENCOUNTER — Telehealth: Payer: Self-pay | Admitting: Cardiology

## 2021-03-08 DIAGNOSIS — R634 Abnormal weight loss: Secondary | ICD-10-CM | POA: Diagnosis not present

## 2021-03-08 NOTE — Telephone Encounter (Signed)
Called to discuss scheduling the Cardiac Stress ordered by Karide,Tobb, DO---no answer and mail box was full---will continue to try and reach patient by phone

## 2021-03-08 NOTE — Addendum Note (Signed)
Addended by: Orvan July on: 03/08/2021 08:11 AM   Modules accepted: Orders

## 2021-03-09 LAB — PE AND FLC, SERUM
A/G Ratio: 1.4 (ref 0.7–1.7)
Albumin ELP: 4 g/dL (ref 2.9–4.4)
Alpha 1: 0.2 g/dL (ref 0.0–0.4)
Alpha 2: 0.7 g/dL (ref 0.4–1.0)
Beta: 1 g/dL (ref 0.7–1.3)
Gamma Globulin: 0.9 g/dL (ref 0.4–1.8)
Globulin, Total: 2.8 g/dL (ref 2.2–3.9)
Ig Kappa Free Light Chain: 22.7 mg/L — ABNORMAL HIGH (ref 3.3–19.4)
Ig Lambda Free Light Chain: 15.8 mg/L (ref 5.7–26.3)
KAPPA/LAMBDA RATIO: 1.44 (ref 0.26–1.65)
Total Protein: 6.8 g/dL (ref 6.0–8.5)

## 2021-03-09 LAB — PROTEIN ELECTROPHORESIS, SERUM, WITH REFLEX

## 2021-03-09 LAB — IMMUNOFIXATION, URINE

## 2021-03-09 NOTE — Telephone Encounter (Signed)
Called to discuss scheduling the Cardiac Stress test ordered by Berniece Salines, DO--no answer and voice mail is full.  Will continue to try and reach patient by phone.

## 2021-03-09 NOTE — Telephone Encounter (Signed)
Spoke with patient and informer her Burke Keels from radiology will be calling her to scheduled the Cardaic Stress test that was ordered for her.  She voiced her understanding.

## 2021-03-09 NOTE — Telephone Encounter (Signed)
PT is returning Marilyn's call.Please advise

## 2021-03-16 ENCOUNTER — Telehealth: Payer: Self-pay | Admitting: Cardiology

## 2021-03-16 NOTE — Telephone Encounter (Signed)
Called pt today to ask what she would like for me to do with her check we were holding for the $29 forms fee and was granted permission to shred it since Dr. Harriet Masson will not be filling out those forms.  Pt also requested someone call her about her most recent labs, because there seemed to be some abnormalities and she did not understand her results.  Best number to call : [phone]  Thank you!  Ammie Dalton

## 2021-03-16 NOTE — Telephone Encounter (Signed)
I called the patient to discuss her lab results but she no answer. Please let her know I am waiting  For her cardiac MRI then I will be able to talk to her about the blood work and the MRI together for better interpretation.

## 2021-03-20 NOTE — Telephone Encounter (Signed)
lvmtcb

## 2021-03-21 NOTE — Telephone Encounter (Signed)
Patient was returning call 

## 2021-03-21 NOTE — Telephone Encounter (Signed)
Advised pt as per Dr. Quintella Reichert previous note. Pt verbalized that she is waiting for the CT results for the lab results to be interpreted.

## 2021-03-22 ENCOUNTER — Telehealth: Payer: Self-pay | Admitting: Cardiology

## 2021-03-22 ENCOUNTER — Encounter: Payer: Self-pay | Admitting: Cardiology

## 2021-03-22 NOTE — Telephone Encounter (Signed)
Left message for patient regarding the Friday  04/21/21 12:00pm Stress Cardiac MRI appointment at [facility]---arrival time is 11:30 am--1st floor admissions for check in.  Will mail information to patient and requested she call with questions or concerns.

## 2021-03-24 DIAGNOSIS — M7501 Adhesive capsulitis of right shoulder: Secondary | ICD-10-CM | POA: Diagnosis not present

## 2021-03-24 DIAGNOSIS — M7551 Bursitis of right shoulder: Secondary | ICD-10-CM | POA: Diagnosis not present

## 2021-03-24 DIAGNOSIS — M7541 Impingement syndrome of right shoulder: Secondary | ICD-10-CM | POA: Diagnosis not present

## 2021-03-24 DIAGNOSIS — S43431A Superior glenoid labrum lesion of right shoulder, initial encounter: Secondary | ICD-10-CM | POA: Diagnosis not present

## 2021-03-28 DIAGNOSIS — M25511 Pain in right shoulder: Secondary | ICD-10-CM | POA: Diagnosis not present

## 2021-03-28 DIAGNOSIS — M6281 Muscle weakness (generalized): Secondary | ICD-10-CM | POA: Diagnosis not present

## 2021-03-28 DIAGNOSIS — M25611 Stiffness of right shoulder, not elsewhere classified: Secondary | ICD-10-CM | POA: Diagnosis not present

## 2021-03-28 DIAGNOSIS — M25411 Effusion, right shoulder: Secondary | ICD-10-CM | POA: Diagnosis not present

## 2021-04-10 ENCOUNTER — Other Ambulatory Visit: Payer: Self-pay

## 2021-04-10 ENCOUNTER — Encounter (HOSPITAL_BASED_OUTPATIENT_CLINIC_OR_DEPARTMENT_OTHER): Payer: Self-pay

## 2021-04-10 ENCOUNTER — Ambulatory Visit (HOSPITAL_BASED_OUTPATIENT_CLINIC_OR_DEPARTMENT_OTHER): Payer: BC Managed Care – PPO

## 2021-04-10 DIAGNOSIS — R400 Somnolence: Secondary | ICD-10-CM

## 2021-04-11 ENCOUNTER — Other Ambulatory Visit (HOSPITAL_COMMUNITY): Payer: Self-pay | Admitting: *Deleted

## 2021-04-11 DIAGNOSIS — R0789 Other chest pain: Secondary | ICD-10-CM

## 2021-04-20 ENCOUNTER — Telehealth (HOSPITAL_COMMUNITY): Payer: Self-pay | Admitting: Emergency Medicine

## 2021-04-20 NOTE — Telephone Encounter (Signed)
Unable to leave vm   RN Navigator Cardiac Imaging [facility] [phone] Office  [phone] Cell  

## 2021-04-21 ENCOUNTER — Ambulatory Visit (HOSPITAL_COMMUNITY)
Admission: RE | Admit: 2021-04-21 | Discharge: 2021-04-21 | Disposition: A | Discharge status: Home / Self Care | Payer: BC Managed Care – PPO | Source: Ambulatory Visit | Attending: Cardiology | Admitting: Cardiology

## 2021-04-21 ENCOUNTER — Ambulatory Visit: Payer: BC Managed Care – PPO | Admitting: Nurse Practitioner

## 2021-04-21 ENCOUNTER — Other Ambulatory Visit: Payer: Self-pay

## 2021-04-21 ENCOUNTER — Encounter: Payer: Self-pay | Admitting: Nurse Practitioner

## 2021-04-21 ENCOUNTER — Encounter: Payer: Self-pay | Admitting: Cardiology

## 2021-04-21 VITALS — BP 94/58 | HR 68 | Temp 97.9°F | Ht 66.0 in | Wt 119.0 lb

## 2021-04-21 DIAGNOSIS — W57XXXA Bitten or stung by nonvenomous insect and other nonvenomous arthropods, initial encounter: Secondary | ICD-10-CM | POA: Diagnosis not present

## 2021-04-21 DIAGNOSIS — N921 Excessive and frequent menstruation with irregular cycle: Secondary | ICD-10-CM

## 2021-04-21 DIAGNOSIS — F17210 Nicotine dependence, cigarettes, uncomplicated: Secondary | ICD-10-CM

## 2021-04-21 DIAGNOSIS — R0789 Other chest pain: Secondary | ICD-10-CM | POA: Diagnosis not present

## 2021-04-21 DIAGNOSIS — S80861A Insect bite (nonvenomous), right lower leg, initial encounter: Secondary | ICD-10-CM | POA: Diagnosis not present

## 2021-04-21 MED ORDER — DOXYCYCLINE HYCLATE 100 MG PO TABS: 100.0000 mg | ORAL_TABLET | Freq: Two times a day (BID) | ORAL | 0 refills | Status: AC

## 2021-04-21 MED ORDER — AMINOPHYLLINE 25 MG/ML IV SOLN
INTRAVENOUS | Status: AC
  Filled 2021-04-21: qty 10

## 2021-04-21 MED ORDER — GADOBUTROL 1 MMOL/ML IV SOLN
6.0000 mL | Freq: Once | INTRAVENOUS | Status: AC | PRN
  Administered 2021-04-21: 6 mL via INTRAVENOUS

## 2021-04-21 MED ORDER — REGADENOSON 0.4 MG/5ML IV SOLN
INTRAVENOUS | Status: AC
  Filled 2021-04-21: qty 5

## 2021-04-21 MED ORDER — NITROGLYCERIN 0.4 MG SL SUBL
SUBLINGUAL_TABLET | SUBLINGUAL | Status: AC
  Filled 2021-04-21: qty 1

## 2021-04-21 MED ORDER — ALBUTEROL SULFATE HFA 108 (90 BASE) MCG/ACT IN AERS
INHALATION_SPRAY | RESPIRATORY_TRACT | Status: AC
  Filled 2021-04-21: qty 6.7

## 2021-04-21 NOTE — Patient Instructions (Signed)
Take Doxycycline 100 mg twice daily for 10 days Notify office immediately if you develop bulls-eye rash, fever, and headache Try to prevent tick bites by spraying on bug repellent prior to going outdoors Consider Tecnu for poison ivy prevention   Tick Bite Information, Adult  Ticks are insects that can bite. Most ticks live in shrubs and grassy areas. They climb onto people and animals that go by. Then they bite. Some ticks carry germs that can make you sick. How can I prevent tick bites? Take these steps: Use insect repellent  Use an insect repellent that has 20% or higher of the ingredients DEET, picaridin, or IR3535. Follow the instructions on the label. Put it on: ? Bare skin. ? The tops of your boots. ? Your pant legs. ? The ends of your sleeves.  If you use an insect repellent that has the ingredient permethrin, follow the instructions on the label. Put it on: ? Clothing. ? Boots. ? Supplies or outdoor gear. ? Tents. When you are outside  Wear long sleeves and long pants.  Wear light-colored clothes.  Tuck your pant legs into your socks.  Stay in the middle of the trail. Do not touch the bushes.  Avoid walking through long grass.  Check for ticks on your clothes, hair, and skin often while you are outside. Before going inside your house, check your clothes, skin, head, neck, armpits, waist, groin, and joint areas. When you go indoors  Check your clothes for ticks. Dry your clothes in a dryer on high heat for 10 minutes or more. If clothes are damp, additional time may be needed.  Wash your clothes right away if they need to be washed. Use hot water.  Check your pets and outdoor gear.  Shower right away.  Check your body for ticks. Do a full body check using a mirror. What is the right way to remove a tick? Remove the tick from your skin as soon as possible. Do not remove the tick with your bare fingers.  To remove a tick that is crawling on your skin: ? Go  outdoors and brush the tick off. ? Use tape or a lint roller.  To remove a tick that is biting: 1. Wash your hands. 2. If you have latex gloves, put them on. 3. Use tweezers, curved forceps, or a tick-removal tool to grasp the tick. Grasp the tick as close to your skin and as close to the tick's head as possible. 4. Gently pull up until the tick lets go.  Try to keep the tick's head attached to its body.  Do not twist or jerk the tick.  Do not squeeze or crush the tick. Do not try to remove a tick with heat, alcohol, petroleum jelly, or fingernail polish.   What should I do after taking out a tick?  Throw away the tick. Do not crush a tick with your fingers.  Clean the bite area and your hands with soap and water, rubbing alcohol, or an iodine wash.  If an antiseptic cream or ointment is available, apply a small amount to the bite area.  Wash and disinfect any instruments that you used to remove the tick. How should I get rid of a live tick? To dispose of a live tick, use one of these methods:  Place the tick in rubbing alcohol.  Place the tick in a bag or container you can close tightly.  Wrap the tick tightly in tape.  Flush the tick down the  toilet. Contact a doctor if:  You have symptoms, such as: ? A fever or chills. ? A red rash that makes a circle (bull's-eye rash) in the bite area. ? Redness and swelling where the tick bit you. ? Headache. ? Pain in a muscle, joint, or bone. ? Being more tired than normal. ? Trouble walking or moving your legs. ? Numbness in your legs. ? Tender and swollen lymph glands.  A part of a tick breaks off and gets stuck in your skin. Get help right away if:  You cannot remove a tick.  You cannot move (have paralysis) or feel weak.  You are feeling worse or have new symptoms.  You find a tick that is biting you and filled with blood. This is important if you are in an area where diseases from ticks are common. Summary  Ticks  may carry germs that can make you sick.  To prevent tick bites wear long sleeves, long pants, and light colors. Use insect repellent. Follow the instructions on the label.  If the tick is biting, do not try to remove it with heat, alcohol, petroleum jelly, or fingernail polish.  Use tweezers, curved forceps, or a tick-removal tool to grasp the tick. Gently pull up until the tick lets go. Do not twist or jerk the tick. Do not squeeze or crush the tick.  If you have symptoms, contact a doctor. This information is not intended to replace advice given to you by your health care provider. Make sure you discuss any questions you have with your health care provider. Document Revised: 11/09/2019 Document Reviewed: 11/09/2019 Elsevier Patient Education  2021 [place]. Doxycycline tablets or capsules What is this medicine? DOXYCYCLINE (dox i SYE kleen) is a tetracycline antibiotic. It kills certain bacteria or stops their growth. It is used to treat many kinds of infections, like dental, skin, respiratory, and urinary tract infections. It also treats acne, Lyme disease, malaria, and certain sexually transmitted infections. This medicine may be used for other purposes; ask your health care provider or pharmacist if you have questions. COMMON BRAND NAME(S): Acticlate, Adoxa, Adoxa CK, Adoxa Pak, Adoxa TT, Alodox, Avidoxy, Doxal, LYMEPAK, Mondoxyne NL, Monodox, Morgidox 1x, Morgidox 1x Kit, Morgidox 2x, Morgidox 2x Kit, NutriDox, Ocudox, [place], [place], [place], Vibra-Tabs, Vibramycin What should I tell my health care provider before I take this medicine? They need to know if you have any of these conditions:  liver disease  long exposure to sunlight like working outdoors  stomach problems like colitis  an unusual or allergic reaction to doxycycline, tetracycline antibiotics, other medicines, foods, dyes, or preservatives  pregnant or trying to get pregnant  breast-feeding How should I use  this medicine? Take this medicine by mouth with a full glass of water. Follow the directions on the prescription label. It is best to take this medicine without food, but if it upsets your stomach take it with food. Take your medicine at regular intervals. Do not take your medicine more often than directed. Take all of your medicine as directed even if you think you are better. Do not skip doses or stop your medicine early. Talk to your pediatrician regarding the use of this medicine in children. While this drug may be prescribed for selected conditions, precautions do apply. Overdosage: If you think you have taken too much of this medicine contact a poison control center or emergency room at once. NOTE: This medicine is only for you. Do not share this medicine with others. What if  I miss a dose? If you miss a dose, take it as soon as you can. If it is almost time for your next dose, take only that dose. Do not take double or extra doses. What may interact with this medicine?  antacids  barbiturates  birth control pills  bismuth subsalicylate  carbamazepine  methoxyflurane  other antibiotics  phenytoin  vitamins that contain iron  warfarin This list may not describe all possible interactions. Give your health care provider a list of all the medicines, herbs, non-prescription drugs, or dietary supplements you use. Also tell them if you smoke, drink alcohol, or use illegal drugs. Some items may interact with your medicine. What should I watch for while using this medicine? Tell your doctor or health care professional if your symptoms do not improve. Do not treat diarrhea with over the counter products. Contact your doctor if you have diarrhea that lasts more than 2 days or if it is severe and watery. Do not take this medicine just before going to bed. It may not dissolve properly when you lay down and can cause pain in your throat. Drink plenty of fluids while taking this medicine to  also help reduce irritation in your throat. This medicine can make you more sensitive to the sun. Keep out of the sun. If you cannot avoid being in the sun, wear protective clothing and use sunscreen. Do not use sun lamps or tanning beds/booths. Birth control pills may not work properly while you are taking this medicine. Talk to your doctor about using an extra method of birth control. If you are being treated for a sexually transmitted infection, avoid sexual contact until you have finished your treatment. Your sexual partner may also need treatment. Avoid antacids, aluminum, calcium, magnesium, and iron products for 4 hours before and 2 hours after taking a dose of this medicine. If you are using this medicine to prevent malaria, you should still protect yourself from contact with mosquitos. Stay in screened-in areas, use mosquito nets, keep your body covered, and use an insect repellent. What side effects may I notice from receiving this medicine? Side effects that you should report to your doctor or health care professional as soon as possible:  allergic reactions like skin rash, itching or hives, swelling of the face, lips, or tongue  difficulty breathing  fever  itching in the rectal or genital area  pain on swallowing  rash, fever, and swollen lymph nodes  redness, blistering, peeling or loosening of the skin, including inside the mouth  severe stomach pain or cramps  unusual bleeding or bruising  unusually weak or tired  yellowing of the eyes or skin Side effects that usually do not require medical attention (report to your doctor or health care professional if they continue or are bothersome):  diarrhea  loss of appetite  nausea, vomiting This list may not describe all possible side effects. Call your doctor for medical advice about side effects. You may report side effects to FDA at 1-800-FDA-1088. Where should I keep my medicine? Keep out of the reach of  children. Store at room temperature, below 30 degrees C (86 degrees F). Protect from light. Keep container tightly closed. Throw away any unused medicine after the expiration date. Taking this medicine after the expiration date can make you seriously ill. NOTE: This sheet is a summary. It may not cover all possible information. If you have questions about this medicine, talk to your doctor, pharmacist, or health care provider.  2021 Elsevier/Gold  Standard (2019-02-12 13:44:53)

## 2021-04-21 NOTE — Progress Notes (Signed)
Acute Office Visit  Subjective:    Patient ID: Sarah [name], female    DOB: [birth year], 45 y.o.   MRN: [id number]  Chief Complaint  Patient presents with  . Tick bites    HPI Patient is in today for tick bites. She states she had a tick on her right lower back approximately 2 weeks ago. She had 3 ticks on her right lower leg 6-days-ago. [place]. She states she began experiencing mild muscle aches to bilateral legs, right neck swollen/tender lymph nodes and headache yesterday. She became concerned for a tick-borne illness. She denies fever, joint pain or rash. She denies taking any treatment for symptoms.  Sarah [name] tells me that she is also experiencing menorrhagia with irregular cycles. She states she is due for pap smear. Pt was encouraged to set appt with PCP for pap smear as soon as possible. She is 45 years old and a long-time cigarette smoker. Smoking cessation was encouraged. Pt informed she is ineligible for low-dose hormone to treat irregular cycles due to age and smoking status.  Past Medical History:  Diagnosis Date  . Hypercalcemia 01/06/2021  . Left breast mass 01/26/2021  . Malaise 01/06/2021  . Need for tetanus, diphtheria, and acellular pertussis (Tdap) vaccine 01/06/2021  . Palpitations 01/26/2021  . Polyuria 01/06/2021  . Scoliosis   . Weight loss 01/02/2021    Past Surgical History:  Procedure Laterality Date  . CERVICAL SPINE SURGERY    . Left fooscrew and plate x2t     . TUBAL LIGATION      Family History  Problem Relation Age of Onset  . Heart disease Mother   . Hyperlipidemia Mother   . Anxiety disorder Sister   . GER disease Son   . Asthma Son   . GER disease Son   . Heart disease Son     Social History   Socioeconomic History  . Marital status: Single    Spouse name: Not on file  . Number of children: Not on file  . Years of education: Not on file  . Highest education level: Not on file  Occupational History  .  Not on file  Tobacco Use  . Smoking status: Current Every Day Smoker    Packs/day: 0.50    Years: 10.00    Pack years: 5.00    Types: Cigarettes  . Smokeless tobacco: Never Used  Vaping Use  . Vaping Use: Never used  Substance and Sexual Activity  . Alcohol use: Not Currently  . Drug use: Never  . Sexual activity: Not Currently  Other Topics Concern  . Not on file  Social History Narrative  . Not on file   Social Determinants of Health   Financial Resource Strain: Not on file  Food Insecurity: Not on file  Transportation Needs: Not on file  Physical Activity: Not on file  Stress: Not on file  Social Connections: Not on file  Intimate Partner Violence: Not on file    Outpatient Medications Prior to Visit  Medication Sig Dispense Refill  . famotidine (PEPCID) 40 MG tablet Take 40 mg by mouth daily.    . furosemide (LASIX) 20 MG tablet Take 1 tablet (20 mg total) by mouth every other day. 45 tablet 3  . potassium chloride (KLOR-CON) 10 MEQ tablet Take 1 tablet (10 mEq total) by mouth every other day. 45 tablet 3   No facility-administered medications prior to visit.    Allergies  Allergen  Reactions  . Mobic [Meloxicam]     Review of Systems  Constitutional: Negative for fatigue and fever.  HENT: Negative for congestion, ear pain, sinus pressure and sore throat.   Eyes: Negative for pain.  Respiratory: Negative for cough, chest tightness, shortness of breath and wheezing.   Cardiovascular: Negative for chest pain and palpitations.  Gastrointestinal: Negative for abdominal pain, constipation, diarrhea, nausea and vomiting.  Endocrine: Negative.   Genitourinary: Negative for dysuria and hematuria.  Musculoskeletal: Positive for myalgias (bilateral legs). Negative for arthralgias, back pain and joint swelling.  Skin: Negative for rash.       Insect bites to right leg and back  Allergic/Immunologic: Positive for environmental allergies.  Neurological: Positive for  headaches. Negative for dizziness and weakness.  Hematological: Positive for adenopathy.  Psychiatric/Behavioral: Negative for dysphoric mood. The patient is not nervous/anxious.        Objective:    Physical Exam Vitals reviewed.  Constitutional:      Appearance: Normal appearance.  HENT:     Head: Normocephalic.     Mouth/Throat:     Mouth: Mucous membranes are dry.  Cardiovascular:     Rate and Rhythm: Normal rate and regular rhythm.     Pulses: Normal pulses.     Heart sounds: Normal heart sounds.  Pulmonary:     Effort: Pulmonary effort is normal.     Breath sounds: Normal breath sounds.  Musculoskeletal:        General: Normal range of motion.     Cervical back: Tenderness (right laeral neck with palpation) present.  Lymphadenopathy:     Cervical: Cervical adenopathy (right tonsillar) present.  Skin:    General: Skin is warm and dry.     Capillary Refill: Capillary refill takes less than 2 seconds.     Findings: Lesion (insect bites to right posterior knee and right flank area) present.  Neurological:     General: No focal deficit present.     Mental Status: She is alert and oriented to person, place, and time.  Psychiatric:        Mood and Affect: Mood normal.        Behavior: Behavior normal.     BP (!) 94/58 (BP Location: Left Arm, Patient Position: Sitting)   Pulse 68   Temp 97.9 F (36.6 C) (Temporal)   Ht 5\' 6"  (1.676 m)   Wt 119 lb (54 kg)   SpO2 100%   BMI 19.21 kg/m  Wt Readings from Last 3 Encounters:  04/21/21 119 lb (54 kg)  04/10/21 120 lb (54.4 kg)  03/07/21 117 lb (53.1 kg)    Health Maintenance Due  Topic Date Due  . HIV Screening  Never done  . Hepatitis C Screening  Never done  . PAP SMEAR-Modifier  Never done  . COVID-19 Vaccine (3 - Booster for Pfizer series) 12/19/2020    Lab Results  Component Value Date   TSH 1.040 11/22/2020   Lab Results  Component Value Date   WBC 6.8 11/22/2020   HGB 14.3 11/22/2020   HCT 42.1  11/22/2020   MCV 90 11/22/2020   PLT 229 11/22/2020   Lab Results  Component Value Date   NA 141 01/06/2021   K 4.1 01/06/2021   CO2 22 01/06/2021   GLUCOSE 101 (H) 01/06/2021   BUN 10 01/06/2021   CREATININE 0.74 01/06/2021   BILITOT 0.4 01/06/2021   ALKPHOS 63 01/06/2021   AST 22 01/06/2021   ALT 23 01/06/2021  PROT 6.8 03/07/2021   ALBUMIN 4.2 01/06/2021   CALCIUM 9.9 01/06/2021    Lab Results  Component Value Date   HGBA1C 5.4 01/02/2021         Assessment & Plan:   1. Tick bite of right lower leg, initial encounter - doxycycline (VIBRA-TABS) 100 MG tablet; Take 1 tablet (100 mg total) by mouth 2 (two) times daily for 10 days.  Dispense: 20 tablet; Refill: 0  2. Menorrhagia with irregular cycle -Schedule pap smear with PCP  3. Cigarette smoker -Smoking cessation encouraged  Take Doxycycline 100 mg twice daily for 10 days Notify office immediately if you develop bulls-eye rash, fever, and headache Try to prevent tick bites by spraying on bug repellent prior to going outdoors Consider Tecnu for poison ivy prevention   I , Lauren Peterson [place] as a scribe for [employer], NP.,have documented all relevant documentation on the behalf of Rip Harbour, NP,as directed by  Rip Harbour, NP while in the presence of Rip Harbour, NP.  I, Rip Harbour, NP, have reviewed all documentation for this visit. The documentation on 04/21/21 for the exam, diagnosis, procedures, and orders are all accurate and complete.   Follow-up: As needed  Signed, Jerrell Belfast, DNP

## 2021-04-21 NOTE — Progress Notes (Signed)
Patient presents for stress MRI today.  BP 119/75.  Lungs CTAB.  EKG today shows NSR, rate 65.  Shared Decision Making/Informed Consent The risks [chest pain, shortness of breath, cardiac arrhythmias, dizziness, blood pressure fluctuations, myocardial infarction, stroke/transient ischemic attack, nausea, vomiting, allergic reaction, and life-threatening complications (estimated to be 1 in 10,000)], benefits (risk stratification, diagnosing coronary artery disease, treatment guidance) and alternatives of a MRI stress test were discussed in detail with Ms. Sarah [name] and she agrees to proceed.

## 2021-04-25 ENCOUNTER — Telehealth: Payer: Self-pay

## 2021-04-25 NOTE — Telephone Encounter (Signed)
Spoke with patient about her stress test, she states Dr.Tobb was going to interpret her blood word after getting the MRI. Will relay information to Dr. Harriet Masson. Patient verbalizes understanding. No further questions or concerns at this time.

## 2021-04-25 NOTE — Telephone Encounter (Signed)
Left message for patient to return our call.

## 2021-04-26 NOTE — Telephone Encounter (Signed)
PT STATES THAT SHE MISSED DR. Quintella Reichert PHONE CALL THIS MORNING, RETURNING CALL

## 2021-04-28 DIAGNOSIS — M25411 Effusion, right shoulder: Secondary | ICD-10-CM | POA: Diagnosis not present

## 2021-04-28 DIAGNOSIS — M25611 Stiffness of right shoulder, not elsewhere classified: Secondary | ICD-10-CM | POA: Diagnosis not present

## 2021-04-28 DIAGNOSIS — M6281 Muscle weakness (generalized): Secondary | ICD-10-CM | POA: Diagnosis not present

## 2021-04-28 DIAGNOSIS — M25511 Pain in right shoulder: Secondary | ICD-10-CM | POA: Diagnosis not present

## 2021-05-03 ENCOUNTER — Other Ambulatory Visit: Payer: Self-pay

## 2021-05-03 ENCOUNTER — Ambulatory Visit (HOSPITAL_BASED_OUTPATIENT_CLINIC_OR_DEPARTMENT_OTHER): Payer: BC Managed Care – PPO

## 2021-05-03 ENCOUNTER — Encounter (HOSPITAL_BASED_OUTPATIENT_CLINIC_OR_DEPARTMENT_OTHER): Payer: Self-pay

## 2021-05-08 ENCOUNTER — Ambulatory Visit: Payer: BC Managed Care – PPO | Admitting: Physician Assistant

## 2021-05-08 ENCOUNTER — Encounter: Payer: Self-pay | Admitting: Physician Assistant

## 2021-05-08 ENCOUNTER — Other Ambulatory Visit: Payer: Self-pay

## 2021-05-08 VITALS — BP 100/62 | HR 88 | Temp 97.3°F | Ht 66.0 in | Wt 122.0 lb

## 2021-05-08 DIAGNOSIS — R252 Cramp and spasm: Secondary | ICD-10-CM | POA: Insufficient documentation

## 2021-05-08 DIAGNOSIS — N926 Irregular menstruation, unspecified: Secondary | ICD-10-CM | POA: Diagnosis not present

## 2021-05-08 DIAGNOSIS — R5383 Other fatigue: Secondary | ICD-10-CM | POA: Diagnosis not present

## 2021-05-08 DIAGNOSIS — R350 Frequency of micturition: Secondary | ICD-10-CM | POA: Diagnosis not present

## 2021-05-08 LAB — POCT URINALYSIS DIP (CLINITEK)
Bilirubin, UA: NEGATIVE
Blood, UA: NEGATIVE
Glucose, UA: NEGATIVE mg/dL
Ketones, POC UA: NEGATIVE mg/dL
Leukocytes, UA: NEGATIVE
Nitrite, UA: NEGATIVE
POC PROTEIN,UA: NEGATIVE
Spec Grav, UA: 1.01 (ref 1.010–1.025)
Urobilinogen, UA: 0.2 E.U./dL
pH, UA: 6 (ref 5.0–8.0)

## 2021-05-08 NOTE — Progress Notes (Signed)
Subjective:  Patient ID: Sarah [name], female    DOB: 1976/10/02  Age: 45 y.o. MRN: [id number]  Chief Complaint  Patient presents with   Urinary Frequency    Urinary Frequency  Associated symptoms include frequency.  Pt states she is going to bathroom more frequently in past few weeks - denies dysuria or hematuria Did discuss this is possible side effect from cardiologist starting lasix qod for patient  Pt has seen Dr Lyda Jester for her GI issues and was put on pepcid which she states has helped with her   Pt was found to have possible left breast mass on her screening mammogram on 2/23 -- she had a diagnostic mammogram and ultrasound which was normal  Pt states she is due for a pap smear - would like GYN referral and that she has been having abnormal menses as well with heavy periods at times and skipping cycles as well Current Outpatient Medications on File Prior to Visit  Medication Sig Dispense Refill   famotidine (PEPCID) 40 MG tablet Take 40 mg by mouth daily.     furosemide (LASIX) 20 MG tablet Take 1 tablet (20 mg total) by mouth every other day. 45 tablet 3   potassium chloride (KLOR-CON) 10 MEQ tablet Take 1 tablet (10 mEq total) by mouth every other day. 45 tablet 3   No current facility-administered medications on file prior to visit.   Past Medical History:  Diagnosis Date   Hypercalcemia 01/06/2021   Left breast mass 01/26/2021   Malaise 01/06/2021   Need for tetanus, diphtheria, and acellular pertussis (Tdap) vaccine 01/06/2021   Palpitations 01/26/2021   Polyuria 01/06/2021   Scoliosis    Weight loss 01/02/2021   Past Surgical History:  Procedure Laterality Date   CERVICAL SPINE SURGERY     Left fooscrew and plate x2t      TUBAL LIGATION      Family History  Problem Relation Age of Onset   Heart disease Mother    Hyperlipidemia Mother    Anxiety disorder Sister    GER disease Son    Asthma Son    GER disease Son    Heart disease Son    Social History    Socioeconomic History   Marital status: Single    Spouse name: Not on file   Number of children: Not on file   Years of education: Not on file   Highest education level: Not on file  Occupational History   Not on file  Tobacco Use   Smoking status: Every Day    Packs/day: 0.50    Years: 10.00    Pack years: 5.00    Types: Cigarettes   Smokeless tobacco: Never  Vaping Use   Vaping Use: Never used  Substance and Sexual Activity   Alcohol use: Not Currently   Drug use: Never   Sexual activity: Not Currently  Other Topics Concern   Not on file  Social History Narrative   Not on file   Social Determinants of Health   Financial Resource Strain: Not on file  Food Insecurity: Not on file  Transportation Needs: Not on file  Physical Activity: Not on file  Stress: Not on file  Social Connections: Not on file    Review of Systems  Genitourinary:  Positive for frequency.  CONSTITUTIONAL: see HPI E/N/T: Negative for ear pain, nasal congestion and sore throat.  CARDIOVASCULAR: see HPI RESPIRATORY: Negative for recent cough and dyspnea.  GASTROINTESTINAL:see HPI GU - see HPI  MSK: Negative for arthralgias and myalgias.  INTEGUMENTARY: Negative for rash.  PSYCHIATRIC: Negative for sleep disturbance and to question depression screen.  Negative for depression, negative for anhedonia.       Objective:  BP 100/62 (BP Location: Left Arm, Patient Position: Sitting, Cuff Size: Normal)   Pulse 88   Temp (!) 97.3 F (36.3 C) (Temporal)   Ht 5\' 6"  (1.676 m)   Wt 122 lb (55.3 kg)   SpO2 98%   BMI 19.69 kg/m   BP/Weight 05/08/2021 05/03/2021 05/09/4314  Systolic BP 400 - 94  Diastolic BP 62 - 58  Wt. (Lbs) 122 120 119  BMI 19.69 19.37 19.21    Physical Exam PHYSICAL EXAM:   VS: BP 100/62 (BP Location: Left Arm, Patient Position: Sitting, Cuff Size: Normal)   Pulse 88   Temp (!) 97.3 F (36.3 C) (Temporal)   Ht 5\' 6"  (1.676 m)   Wt 122 lb (55.3 kg)   SpO2 98%   BMI  19.69 kg/m   PHYSICAL EXAM:   VS: BP 100/62 (BP Location: Left Arm, Patient Position: Sitting, Cuff Size: Normal)   Pulse 88   Temp (!) 97.3 F (36.3 C) (Temporal)   Ht 5\' 6"  (1.676 m)   Wt 122 lb (55.3 kg)   SpO2 98%   BMI 19.69 kg/m   GEN: Well nourished, well developed, in no acute distress  Cardiac: RRR; no murmurs, rubs, or gallops,no edema -  Respiratory:  normal respiratory rate and pattern with no distress - normal breath sounds with no rales, rhonchi, wheezes or rubs GI: normal bowel sounds, no masses or tenderness MS: no deformity or atrophy  Skin: warm and dry, no rash  Psych: euthymic mood, appropriate affect and demeanor   Office Visit on 05/08/2021  Component Date Value Ref Range Status   Color, UA 05/08/2021 yellow  yellow Final   Clarity, UA 05/08/2021 clear  clear Final   Glucose, UA 05/08/2021 negative  negative mg/dL Final   Bilirubin, UA 05/08/2021 negative  negative Final   Ketones, POC UA 05/08/2021 negative  negative mg/dL Final   Spec Grav, UA 05/08/2021 1.010  1.010 - 1.025 Final   Blood, UA 05/08/2021 negative  negative Final   pH, UA 05/08/2021 6.0  5.0 - 8.0 Final   POC PROTEIN,UA 05/08/2021 negative  negative, trace Final   Urobilinogen, UA 05/08/2021 0.2  0.2 or 1.0 E.U./dL Final   Nitrite, UA 05/08/2021 Negative  Negative Final   Leukocytes, UA 05/08/2021 Negative  Negative Final    Diabetic Foot Exam - Simple   No data filed      Lab Results  Component Value Date   WBC 6.8 11/22/2020   HGB 14.3 11/22/2020   HCT 42.1 11/22/2020   PLT 229 11/22/2020   GLUCOSE 101 (H) 01/06/2021   ALT 23 01/06/2021   AST 22 01/06/2021   NA 141 01/06/2021   K 4.1 01/06/2021   CL 104 01/06/2021   CREATININE 0.74 01/06/2021   BUN 10 01/06/2021   CO2 22 01/06/2021   TSH 1.040 11/22/2020   INR 1.0 11/22/2020   HGBA1C 5.4 01/02/2021      Assessment & Plan:   1. Palpitations Follow up with cardiology as directed 2. Polyuria - POCT  URINALYSIS DIP (CLINITEK) Ua clear - probable due to lasix 3 irregular menses Refer to gyn   No orders of the defined types were placed in this encounter.   Orders Placed This Encounter  Procedures   CBC  with Differential/Platelet   Comprehensive metabolic panel   TSH   Magnesium   Ambulatory referral to Gynecology   POCT URINALYSIS DIP (CLINITEK)      Follow-up: Return in about 6 months (around 11/07/2021) for follow up.  An After Visit Summary was printed and given to the patient.  Yetta Flock [facility] [phone]

## 2021-05-09 ENCOUNTER — Ambulatory Visit (INDEPENDENT_AMBULATORY_CARE_PROVIDER_SITE_OTHER): Payer: BC Managed Care – PPO | Admitting: Cardiology

## 2021-05-09 ENCOUNTER — Other Ambulatory Visit: Payer: Self-pay | Admitting: Cardiology

## 2021-05-09 ENCOUNTER — Encounter: Payer: Self-pay | Admitting: Cardiology

## 2021-05-09 VITALS — BP 102/60 | HR 67 | Ht 66.0 in | Wt 121.8 lb

## 2021-05-09 DIAGNOSIS — R5383 Other fatigue: Secondary | ICD-10-CM

## 2021-05-09 NOTE — Progress Notes (Signed)
Cardiology Office Note:    Date:  05/09/2021   ID:  Sarah [name], DOB August 30, 1976, MRN [id number]  PCP:  Marge Duncans, PA-C  Cardiologist:  Berniece Salines, DO  Electrophysiologist:  None   Referring MD: Marge Duncans, PA-C   No chief complaint on file. " I am still a bit tired"  History of Present Illness:    Sarah [name] is a 45 y.o. female with a hx of left breast mass is pending diagnostic mammogram and ultrasound, will initially presented to be evaluated for palpitation and shortness of breath.  At that time I recommended patient undergo an echocardiogram as well as event monitor.  She did have daytime somnolence and fatigue which I ordered a sleep study.  Unfortunately her in lab sleep study has been canceled due to insurance not covering this and recommend home sleep study. I saw the patient on March 07, 2021 at that time we discussed her echocardiogram pattern and I inform her of further testing that may need to be done.  She did her lab testing as well as cardiac MRI.  Current MRI did not show any evidence of perfusion defect as well as no evidence of infiltrative disease.  Today she tells me that she still does have some chest sharp pain.  But she notes that the fatigue is really the problem.  She had a sleep study she is waiting for the results.  Past Medical History:  Diagnosis Date   Hypercalcemia 01/06/2021   Left breast mass 01/26/2021   Malaise 01/06/2021   Need for tetanus, diphtheria, and acellular pertussis (Tdap) vaccine 01/06/2021   Palpitations 01/26/2021   Polyuria 01/06/2021   Scoliosis    Weight loss 01/02/2021    Past Surgical History:  Procedure Laterality Date   CERVICAL SPINE SURGERY     Left fooscrew and plate x2t      TUBAL LIGATION      Current Medications: Current Meds  Medication Sig   famotidine (PEPCID) 40 MG tablet Take 40 mg by mouth daily.   furosemide (LASIX) 20 MG tablet Take 1 tablet (20 mg total) by mouth every other day.   potassium chloride  (KLOR-CON) 10 MEQ tablet Take 1 tablet (10 mEq total) by mouth every other day.     Allergies:   Mobic [meloxicam]   Social History   Socioeconomic History   Marital status: Single    Spouse name: Not on file   Number of children: Not on file   Years of education: Not on file   Highest education level: Not on file  Occupational History   Not on file  Tobacco Use   Smoking status: Every Day    Packs/day: 0.50    Years: 10.00    Pack years: 5.00    Types: Cigarettes   Smokeless tobacco: Never  Vaping Use   Vaping Use: Never used  Substance and Sexual Activity   Alcohol use: Not Currently   Drug use: Never   Sexual activity: Not Currently  Other Topics Concern   Not on file  Social History Narrative   Not on file   Social Determinants of Health   Financial Resource Strain: Not on file  Food Insecurity: Not on file  Transportation Needs: Not on file  Physical Activity: Not on file  Stress: Not on file  Social Connections: Not on file     Family History: The patient's family history includes Anxiety disorder in her sister; Asthma in her son; GER disease in  her son and son; Heart disease in her mother and son; Hyperlipidemia in her mother.  ROS:   Review of Systems  Constitution: Negative for decreased appetite, fever and weight gain.  HENT: Negative for congestion, ear discharge, hoarse voice and sore throat.   Eyes: Negative for discharge, redness, vision loss in right eye and visual halos.  Cardiovascular: Negative for chest pain, dyspnea on exertion, leg swelling, orthopnea and palpitations.  Respiratory: Negative for cough, hemoptysis, shortness of breath and snoring.   Endocrine: Negative for heat intolerance and polyphagia.  Hematologic/Lymphatic: Negative for bleeding problem. Does not bruise/bleed easily.  Skin: Negative for flushing, nail changes, rash and suspicious lesions.  Musculoskeletal: Negative for arthritis, joint pain, muscle cramps, myalgias,  neck pain and stiffness.  Gastrointestinal: Negative for abdominal pain, bowel incontinence, diarrhea and excessive appetite.  Genitourinary: Negative for decreased libido, genital sores and incomplete emptying.  Neurological: Negative for brief paralysis, focal weakness, headaches and loss of balance.  Psychiatric/Behavioral: Negative for altered mental status, depression and suicidal ideas.  Allergic/Immunologic: Negative for HIV exposure and persistent infections.    EKGs/Labs/Other Studies Reviewed:    The following studies were reviewed today:   EKG:  The ekg ordered today demonstrates   Cardiac MR Apr 22, 2019 IMPRESSION: 1.  Normal stress perfusion   2.  No evidence of cardiac amyloidosis   3.  Normal LV size, wall thickness, and systolic function (EF 50%)   4.  Normal RV size and systolic function (EF 27%)   5.  No late gadolinium enhancement to suggest myocardial scar ZIO monitor The patient wore the monitor for 14 days starting February 09, 2021. Indication: Palpitations   The minimum heart rate was 49 bpm, maximum heart rate was 50 bpm, and average heart rate was 86 bpm. Predominant underlying rhythm was Sinus Rhythm.   Premature atrial complexes were rare less than 1%. Premature Ventricular complexes were rare less than 1%.   No ventricular tachycardia, no pauses, No AV block and no atrial fibrillation present. No patient triggered events and no diary events are reported.       Conclusion: Normal/unremarkable study with no evidence of significant arrhythmia.    Transthoracic echocardiogram April 2022 IMPRESSIONS   1. Left ventricular ejection fraction, by estimation, is 60 to 65%. The  left ventricle has normal function. The left ventricle has no regional  wall motion abnormalities. Left ventricular diastolic parameters were  normal. Speckled pattern appreciated in  the wall of the interventricular septum. No evidence of left ventricular  hypertrophy.   2.  The average left ventricular global longitudinal strain is abnormal (  -11.4 %). The strain pattern have the "cherry on the top" appearance.  Recommend further testing to rule out cardiac amyloid.   3. Right ventricular systolic function is normal. The right ventricular  size is normal. There is normal pulmonary artery systolic pressure.   4. The mitral valve is normal in structure. Trivial mitral valve  regurgitation. No evidence of mitral stenosis.   5. The aortic valve is normal in structure. Aortic valve regurgitation is  not visualized. No aortic stenosis is present.   6. The inferior vena cava is normal in size with greater than 50%  respiratory variability, suggesting right atrial pressure of 3 mmHg.   Comparison(s): No prior Echocardiogram.   FINDINGS   Left Ventricle: Left ventricular ejection fraction, by estimation, is 60  to 65%. The left ventricle has normal function. The left ventricle has no  regional wall motion abnormalities.  The average left ventricular global  longitudinal strain is -11.4 %.  The global longitudinal strain is abnormal. The left ventricular internal  cavity size was normal in size. There is no left ventricular hypertrophy.  Left ventricular diastolic parameters were normal.   Right Ventricle: The right ventricular size is normal. No increase in  right ventricular wall thickness. Right ventricular systolic function is  normal. There is normal pulmonary artery systolic pressure. The tricuspid  regurgitant velocity is 2.46 m/s, and   with an assumed right atrial pressure of 8 mmHg, the estimated right  ventricular systolic pressure is 96.7 mmHg.   Left Atrium: Left atrial size was normal in size.   Right Atrium: Right atrial size was normal in size.   Pericardium: There is no evidence of pericardial effusion.   Mitral Valve: The mitral valve is normal in structure. Trivial mitral  valve regurgitation. No evidence of mitral valve stenosis.    Tricuspid Valve: The tricuspid valve is normal in structure. Tricuspid  valve regurgitation is mild . No evidence of tricuspid stenosis.   Aortic Valve: The aortic valve is normal in structure. Aortic valve  regurgitation is not visualized. No aortic stenosis is present.   Pulmonic Valve: The pulmonic valve was normal in structure. Pulmonic valve  regurgitation is not visualized. No evidence of pulmonic stenosis.   Aorta: The aortic root is normal in size and structure.   Venous: The inferior vena cava is normal in size with greater than 50%  respiratory variability, suggesting right atrial pressure of 3 mmHg.   IAS/Shunts: No atrial level shunt detected by color flow Doppler.       Recent Labs: 11/22/2020: Hemoglobin 14.3; Platelets 229; TSH 1.040 01/06/2021: ALT 23; BUN 10; Creatinine, Ser 0.74; Potassium 4.1; Sodium 141  Recent Lipid Panel No results found for: CHOL, TRIG, HDL, CHOLHDL, VLDL, LDLCALC, LDLDIRECT  Physical Exam:    VS:  BP 102/60   Pulse 67   Ht 5\' 6"  (1.676 m)   Wt 121 lb 12.8 oz (55.2 kg)   SpO2 97%   BMI 19.66 kg/m     Wt Readings from Last 3 Encounters:  05/09/21 121 lb 12.8 oz (55.2 kg)  05/08/21 122 lb (55.3 kg)  05/03/21 120 lb (54.4 kg)     GEN: Well nourished, well developed in no acute distress HEENT: Normal NECK: No JVD; No carotid bruits LYMPHATICS: No lymphadenopathy CARDIAC: S1S2 noted,RRR, no murmurs, rubs, gallops RESPIRATORY:  Clear to auscultation without rales, wheezing or rhonchi  ABDOMEN: Soft, non-tender, non-distended, +bowel sounds, no guarding. EXTREMITIES: No edema, No cyanosis, no clubbing MUSCULOSKELETAL:  No deformity  SKIN: Warm and dry NEUROLOGIC:  Alert and oriented x 3, non-focal PSYCHIATRIC:  Normal affect, good insight  ASSESSMENT:    1. Fatigue, unspecified type    PLAN:     I spoke to the patient today about all of her testing results.  I was able to answer all of her questions.  She still is  fatigued there are multiple potential etiologies and these are being explored.  But first she is following with her gynecologist to see if this is hormonal.  In addition we are waiting for her sleep apnea test.  She had blood work with his PCP yesterday which only included CBC, I am going to add ferritin TIBC to make sure microcytic anemia is not playing a role here.  In terms of her testing cardiac MRI did not show any evidence of cardiac amyloid, and we can wait  for her lab testing and her follow-up report from other subspecialists.  If her clinical setting changes she may benefit from a heme-onc evaluation.  The patient is in agreement with the above plan. The patient left the office in stable condition.  The patient will follow up in 4 months   Medication Adjustments/Labs and Tests Ordered: Current medicines are reviewed at length with the patient today.  Concerns regarding medicines are outlined above.  Orders Placed This Encounter  Procedures   Iron, TIBC and Ferritin Panel   No orders of the defined types were placed in this encounter.   Patient Instructions  Medication Instructions:  Your physician recommends that you continue on your current medications as directed. Please refer to the Current Medication list given to you today.  *If you need a refill on your cardiac medications before your next appointment, please call your pharmacy*   Lab Work: Your physician recommends that you return for lab work: TODAY: Iron panel, TIBC and Ferritin   If you have labs (blood work) drawn today and your tests are completely normal, you will receive your results only by: [place] (if you have MyChart) OR A paper copy in the mail If you have any lab test that is abnormal or we need to change your treatment, we will call you to review the results.   Testing/Procedures: None   Follow-Up: At [facility], you and your health needs are our priority.  As part of our continuing  mission to provide you with exceptional heart care, we have created designated Provider Care Teams.  These Care Teams include your primary Cardiologist (physician) and Advanced Practice Providers (APPs -  Physician Assistants and Nurse Practitioners) who all work together to provide you with the care you need, when you need it.  We recommend signing up for the patient portal called "MyChart".  Sign up information is provided on this After Visit Summary.  MyChart is used to connect with patients for Virtual Visits (Telemedicine).  Patients are able to view lab/test results, encounter notes, upcoming appointments, etc.  Non-urgent messages can be sent to your provider as well.   To learn more about what you can do with MyChart, go to [email or web address].    Your next appointment:   4 month(s)  The format for your next appointment:   In Person  Provider:   [facility] - Berniece Salines, DO   Other Instructions    Adopting a Healthy Lifestyle.  Know what a healthy weight is for you (roughly BMI <25) and aim to maintain this   Aim for 7+ servings of fruits and vegetables daily   65-80+ fluid ounces of water or unsweet tea for healthy kidneys   Limit to max 1 drink of alcohol per day; avoid smoking/tobacco   Limit animal fats in diet for cholesterol and heart health - choose grass fed whenever available   Avoid highly processed foods, and foods high in saturated/trans fats   Aim for low stress - take time to unwind and care for your mental health   Aim for 150 min of moderate intensity exercise weekly for heart health, and weights twice weekly for bone health   Aim for 7-9 hours of sleep daily   When it comes to diets, agreement about the perfect plan isnt easy to find, even among the experts. Experts at the [employer] developed an idea known as the Healthy Eating Plate. Just imagine a plate divided into logical, healthy portions.  The emphasis is on diet  quality:   Load up on vegetables and fruits - one-half of your plate: Aim for color and variety, and remember that potatoes dont count.   Go for whole grains - one-quarter of your plate: Whole wheat, barley, wheat berries, quinoa, oats, brown rice, and foods made with them. If you want pasta, go with whole wheat pasta.   Protein power - one-quarter of your plate: Fish, chicken, beans, and nuts are all healthy, versatile protein sources. Limit red meat.   The diet, however, does go beyond the plate, offering a few other suggestions.   Use healthy plant oils, such as olive, canola, soy, corn, sunflower and peanut. Check the labels, and avoid partially hydrogenated oil, which have unhealthy trans fats.   If youre thirsty, drink water. Coffee and tea are good in moderation, but skip sugary drinks and limit milk and dairy products to one or two daily servings.   The type of carbohydrate in the diet is more important than the amount. Some sources of carbohydrates, such as vegetables, fruits, whole grains, and beans-are healthier than others.   Finally, stay active  Signed, Berniece Salines, DO  05/09/2021 12:12 PM    [place]

## 2021-05-09 NOTE — Patient Instructions (Signed)
Medication Instructions:  Your physician recommends that you continue on your current medications as directed. Please refer to the Current Medication list given to you today.  *If you need a refill on your cardiac medications before your next appointment, please call your pharmacy*   Lab Work: Your physician recommends that you return for lab work: TODAY: Iron panel, TIBC and Ferritin   If you have labs (blood work) drawn today and your tests are completely normal, you will receive your results only by: [place] (if you have MyChart) OR A paper copy in the mail If you have any lab test that is abnormal or we need to change your treatment, we will call you to review the results.   Testing/Procedures: None   Follow-Up: At [facility], you and your health needs are our priority.  As part of our continuing mission to provide you with exceptional heart care, we have created designated Provider Care Teams.  These Care Teams include your primary Cardiologist (physician) and Advanced Practice Providers (APPs -  Physician Assistants and Nurse Practitioners) who all work together to provide you with the care you need, when you need it.  We recommend signing up for the patient portal called "MyChart".  Sign up information is provided on this After Visit Summary.  MyChart is used to connect with patients for Virtual Visits (Telemedicine).  Patients are able to view lab/test results, encounter notes, upcoming appointments, etc.  Non-urgent messages can be sent to your provider as well.   To learn more about what you can do with MyChart, go to [email or web address].    Your next appointment:   4 month(s)  The format for your next appointment:   In Person  Provider:   [facility] - Berniece Salines, DO   Other Instructions

## 2021-05-10 LAB — COMPREHENSIVE METABOLIC PANEL
ALT: 15 IU/L (ref 0–32)
AST: 16 IU/L (ref 0–40)
Albumin/Globulin Ratio: 2 (ref 1.2–2.2)
Albumin: 4.1 g/dL (ref 3.8–4.8)
Alkaline Phosphatase: 59 IU/L (ref 44–121)
BUN/Creatinine Ratio: 12 (ref 9–23)
BUN: 8 mg/dL (ref 6–24)
Bilirubin Total: 0.3 mg/dL (ref 0.0–1.2)
CO2: 24 mmol/L (ref 20–29)
Calcium: 9.9 mg/dL (ref 8.7–10.2)
Chloride: 103 mmol/L (ref 96–106)
Creatinine, Ser: 0.67 mg/dL (ref 0.57–1.00)
Globulin, Total: 2.1 g/dL (ref 1.5–4.5)
Glucose: 73 mg/dL (ref 65–99)
Potassium: 4.1 mmol/L (ref 3.5–5.2)
Sodium: 140 mmol/L (ref 134–144)
Total Protein: 6.2 g/dL (ref 6.0–8.5)
eGFR: 111 mL/min/{1.73_m2} (ref >59)

## 2021-05-10 LAB — CBC WITH DIFFERENTIAL/PLATELET
Basophils Absolute: 0 10*3/uL (ref 0.0–0.2)
Basos: 1 %
EOS (ABSOLUTE): 0.1 10*3/uL (ref 0.0–0.4)
Eos: 1 %
Hematocrit: 40.4 % (ref 34.0–46.6)
Hemoglobin: 13.5 g/dL (ref 11.1–15.9)
Immature Grans (Abs): 0 10*3/uL (ref 0.0–0.1)
Immature Granulocytes: 0 %
Lymphocytes Absolute: 1.7 10*3/uL (ref 0.7–3.1)
Lymphs: 29 %
MCH: 29.8 pg (ref 26.6–33.0)
MCHC: 33.4 g/dL (ref 31.5–35.7)
MCV: 89 fL (ref 79–97)
Monocytes Absolute: 0.5 10*3/uL (ref 0.1–0.9)
Monocytes: 9 %
Neutrophils Absolute: 3.5 10*3/uL (ref 1.4–7.0)
Neutrophils: 60 %
Platelets: 265 10*3/uL (ref 150–450)
RBC: 4.53 x10E6/uL (ref 3.77–5.28)
RDW: 12.7 % (ref 11.7–15.4)
WBC: 5.8 10*3/uL (ref 3.4–10.8)

## 2021-05-10 LAB — MAGNESIUM: Magnesium: 2 mg/dL (ref 1.6–2.3)

## 2021-05-10 LAB — IRON,TIBC AND FERRITIN PANEL
Ferritin: 20 ng/mL (ref 15–150)
Iron Saturation: 18 % (ref 15–55)
Iron: 72 ug/dL (ref 27–159)
Total Iron Binding Capacity: 408 ug/dL (ref 250–450)
UIBC: 336 ug/dL (ref 131–425)

## 2021-05-10 LAB — TSH: TSH: 1.3 u[IU]/mL (ref 0.450–4.500)

## 2021-05-12 ENCOUNTER — Telehealth: Payer: Self-pay

## 2021-05-12 NOTE — Telephone Encounter (Signed)
Left message on patients voicemail to please return our call.   

## 2021-05-12 NOTE — Telephone Encounter (Signed)
-----   Message from Berniece Salines, DO sent at 05/12/2021  1:14 PM EDT ----- All labs normal

## 2021-05-12 NOTE — Telephone Encounter (Signed)
Pt returning your phone call

## 2021-05-15 ENCOUNTER — Telehealth: Payer: Self-pay

## 2021-05-15 NOTE — Telephone Encounter (Signed)
Spoke with patient regarding results and recommendation.  Patient verbalizes understanding and is agreeable to plan of care. Advised patient to call back with any issues or concerns.  

## 2021-05-15 NOTE — Telephone Encounter (Signed)
-----   Message from Berniece Salines, DO sent at 05/12/2021  1:14 PM EDT ----- All labs normal

## 2021-05-15 NOTE — Telephone Encounter (Signed)
Patient is returning call.  °

## 2021-05-15 NOTE — Telephone Encounter (Signed)
Left message on patients voicemail to please return our call.   

## 2021-05-19 ENCOUNTER — Ambulatory Visit: Payer: BC Managed Care – PPO | Admitting: Cardiology

## 2021-05-21 ENCOUNTER — Encounter: Payer: Self-pay | Admitting: Physician Assistant

## 2021-05-23 ENCOUNTER — Encounter: Payer: Self-pay | Admitting: Nurse Practitioner

## 2021-05-23 ENCOUNTER — Other Ambulatory Visit: Payer: Self-pay

## 2021-05-23 ENCOUNTER — Ambulatory Visit: Payer: BC Managed Care – PPO | Admitting: Nurse Practitioner

## 2021-05-23 VITALS — BP 100/62 | HR 78 | Temp 98.2°F | Ht 66.0 in | Wt 125.0 lb

## 2021-05-23 DIAGNOSIS — F17210 Nicotine dependence, cigarettes, uncomplicated: Secondary | ICD-10-CM | POA: Diagnosis not present

## 2021-05-23 DIAGNOSIS — Z6820 Body mass index (BMI) 20.0-20.9, adult: Secondary | ICD-10-CM | POA: Diagnosis not present

## 2021-05-23 DIAGNOSIS — Z8262 Family history of osteoporosis: Secondary | ICD-10-CM

## 2021-05-23 DIAGNOSIS — M79671 Pain in right foot: Secondary | ICD-10-CM | POA: Diagnosis not present

## 2021-05-23 DIAGNOSIS — S92514A Nondisplaced fracture of proximal phalanx of right lesser toe(s), initial encounter for closed fracture: Secondary | ICD-10-CM | POA: Diagnosis not present

## 2021-05-23 DIAGNOSIS — M7989 Other specified soft tissue disorders: Secondary | ICD-10-CM | POA: Diagnosis not present

## 2021-05-23 NOTE — Patient Instructions (Addendum)
Obtain x-ray of right foot at [facility] Many things can cause foot pain. Some common causes are: An injury. A sprain. Arthritis. Blisters. Bunions. Follow these instructions at home: Managing pain, stiffness, and swelling If directed, put ice on the painful area: Put ice in a plastic bag. Place a towel between your skin and the bag. Leave the ice on for 20 minutes, 2-3 times a day.  Activity Do not stand or walk for long periods. Return to your normal activities as told by your health care provider. Ask your health care provider what activities are safe for you. Do stretches to relieve foot pain and stiffness as told by your health care provider. Do not lift anything that is heavier than 10 lb (4.5 kg), or the limit that you are told, until your health care provider says that it is safe. Lifting a lot of weight can put added pressure on your feet. Lifestyle Wear comfortable, supportive shoes that fit you well. Do not wear high heels. Keep your feet clean and dry. General instructions Take over-the-counter and prescription medicines only as told by your health care provider. Rub your foot gently. Pay attention to any changes in your symptoms. Keep all follow-up visits as told by your health care provider. This is important. Contact a health care provider if: Your pain does not get better after a few days of self-care. Your pain gets worse. You cannot stand on your foot. Get help right away if: Your foot is numb or tingling. Your foot or toes are swollen. Your foot or toes turn white or blue. You have warmth and redness along your foot. Summary Common causes of foot pain are injury, sprain, arthritis, blisters or bunions. Ice, medicines, and comfortable shoes may help foot pain. Contact your health care provider if your pain does not get better after a few days of self-care. This information is not intended to replace advice given to you by your health care  provider. Make sure you discuss any questions you have with your healthcare provider. Document Revised: 08/28/2018 Document Reviewed: 08/28/2018 Elsevier Patient Education  2022 [place] for Routine Care of Injuries Many injuries can be cared for with rest, ice, compression, and elevation (RICE therapy). This includes: Resting the injured body part. Putting ice on the injury. Putting pressure (compression) on the injury. Raising the injured part (elevation). Using RICE therapy can help to lessen pain and swelling. Supplies needed: Ice. Plastic bag. Towel. Elastic bandage. Pillow or pillows to raise your injured body part. How to care for your injury with RICE therapy Rest Try to rest the injured part of your body. You can go back to your normal activities when your doctor says it is okay to do them and when you can do themwithout pain. If you rest the injury too much, it may not heal as well. Some injuries heal better with early movement instead of resting for too long. Ask your doctor ifyou should do exercises to help your injury get better. Ice  If told, put ice on the injured area. To do this: Put ice in a plastic bag. Place a towel between your skin and the bag. Leave the ice on for 20 minutes, 2-3 times a day. Take off the ice if your skin turns bright red. This is very important. If you cannot feel pain, heat, or cold, you have a greater risk of damage to the area. Do not put ice on your bare skin. Use ice  for as many days as your doctor tells you to use it.  Compression Put pressure on the injured area. This can be done with an elastic bandage. If this type of bandage has been put on your injury: Follow instructions on the package the bandage came in about how to use it. Do not wrap the bandage too tightly. Wrap the bandage more loosely if part of your body beyond the bandage is blue, swollen, cold, painful, or loses feeling. Take off the bandage and put it  on again every 3-4 hours or as told by your doctor. See your doctor if the bandage seems to make your problems worse.  Elevation Raise the injured area above the level of your heart while you are sitting orlying down. Follow these instructions at home: If your symptoms get worse or last a long time, make a follow-up appointment with your doctor. You may need to have imaging tests, such as X-rays or an MRI. If you have imaging tests, ask how to get your results when they are ready. Return to your normal activities when your doctor says that it is safe. Keep all follow-up visits. Contact a doctor if: You keep having pain and swelling. Your symptoms get worse. Get help right away if: You have sudden, very bad pain at your injury or lower than your injury. You have redness or more swelling around your injury. You have tingling or numbness at your injury or lower than your injury, and it does not go away when you take off the bandage. Summary Many injuries can be cared for using rest, ice, compression, and elevation (RICE therapy). You can go back to your normal activities when your doctor says it is okay and when you can do them without pain. Put ice on the injured area as told by your doctor. Get help if your symptoms get worse or if you keep having pain and swelling. This information is not intended to replace advice given to you by your health care provider. Make sure you discuss any questions you have with your healthcare provider. Document Revised: 09/01/2020 Document Reviewed: 09/01/2020 Elsevier Patient Education  2022 [place].  Vitamin D Deficiency Vitamin D deficiency is when your body does not have enough vitamin D. Vitamin D is important to your body because: It helps your body use other minerals. It helps to keep your bones strong and healthy. It may help to prevent some diseases. It helps your heart and other muscles work well. Not getting enough vitamin D can make your  bones soft. It can also cause otherhealth problems. What are the causes? This condition may be caused by: Not eating enough foods that contain vitamin D. Not getting enough sun. Having diseases that make it hard for your body to absorb vitamin D. Having a surgery in which a part of the stomach or a part of the small intestine is removed. Having kidney disease or liver disease. What increases the risk? You are more likely to get this condition if: You are older. You do not spend much time outdoors. You live in a nursing home. You have had broken bones. You have weak or thin bones (osteoporosis). You have a disease or condition that changes how your body absorbs vitamin D. You have dark skin. You take certain medicines. You are overweight or obese. What are the signs or symptoms? In mild cases, there may not be any symptoms. If the condition is very bad, symptoms may include: Bone pain. Muscle pain. Falling  often. Broken bones caused by a minor injury. How is this treated? Treatment may include taking supplements as told by your doctor. Your doctor will tell you what dose is best for you. Supplements may include: Vitamin D. Calcium. Follow these instructions at home: Eating and drinking  Eat foods that contain vitamin D, such as: Dairy products, cereals, or juices with added vitamin D. Check the label. Fish, such as salmon or trout. Eggs. Oysters. Mushrooms. The items listed above may not be a complete list of what you can eat and drink. Contact a dietitian for more options. General instructions Take medicines and supplements only as told by your doctor. Get regular, safe exposure to natural sunlight. Do not use a tanning bed. Maintain a healthy weight. Lose weight if needed. Keep all follow-up visits as told by your doctor. This is important. How is this prevented? You can get vitamin D by: Eating foods that naturally contain vitamin D. Eating or drinking products that  have vitamin D added to them, such as cereals, juices, and milk. Taking vitamin D or a multivitamin that contains vitamin D. Being in the sun. Your body makes vitamin D when your skin is exposed to sunlight. Your body changes the sunlight into a form of the vitamin that it can use. Contact a doctor if: Your symptoms do not go away. You feel sick to your stomach (nauseous). You throw up (vomit). You poop less often than normal, or you have trouble pooping (constipation). Summary Vitamin D deficiency is when your body does not have enough vitamin D. Vitamin D helps to keep your bones strong and healthy. This condition is often treated by taking a supplement. Your doctor will tell you what dose is best for you. This information is not intended to replace advice given to you by your health care provider. Make sure you discuss any questions you have with your healthcare provider. Document Revised: 07/21/2018 Document Reviewed: 07/21/2018 Elsevier Patient Education  [place] protect organs, store calcium, anchor muscles, and support the whole body. Keeping your bones strong is important, especially as you get older. Youcan take actions to help keep your bones strong and healthy. Why is keeping my bones healthy important?  Keeping your bones healthy is important because your body constantly replaces bone cells. Cells get old, and new cells take their place. As we age, we lose bone cells because the body may not be able to make enough new cells to replace the old cells. The amount of bone cells and bone tissue you have is referred toas bone mass. The higher your bone mass, the stronger your bones. The aging process leads to an overall loss of bone mass in the body, which can increase the likelihood of: Joint pain and stiffness. Broken bones. A condition in which the bones become weak and brittle (osteoporosis). A large decline in bone mass occurs in older adults. In  women, it occurs aboutthe time of menopause. What actions can I take to keep my bones healthy? Good health habits are important for maintaining healthy bones. This includes eating nutritious foods and exercising regularly. To have healthy bones, you need to get enough of the right minerals and vitamins. Most nutrition experts recommend getting these nutrients from the foods that you eat. In some cases, taking supplements may also be recommended. Doing certain types of exercise isalso important for bone health. What are the nutritional recommendations for healthy bones?  Eating a well-balanced diet with plenty of calcium  and vitamin D will help to protect your bones. Nutritional recommendations vary from person to person. Ask your health care provider what is healthy for you. Here are some generalguidelines. Get enough calcium Calcium is the most important (essential) mineral for bone health. Most people can get enough calcium from their diet, but supplements may be recommended for people who are at risk for osteoporosis. Good sources of calcium include: Dairy products, such as low-fat or nonfat milk, cheese, and yogurt. Dark green leafy vegetables, such as bok choy and broccoli. Calcium-fortified foods, such as orange juice, cereal, bread, soy beverages, and tofu products. Nuts, such as almonds. Follow these recommended amounts for daily calcium intake: Children, age 8-3: 700 mg. Children, age 8-8: 1,000 mg. Children, age 59-13: 1,300 mg. Teens, age 32-18: 1,300 mg. Adults, age 71-50: 1,000 mg. Adults, age 54-70: Men: 1,000 mg. Women: 1,200 mg. Adults, age 88 or older: 1,200 mg. Pregnant and breastfeeding females: Teens: 1,300 mg. Adults: 1,000 mg. Get enough vitamin D Vitamin D is the most essential vitamin for bone health. It helps the body absorb calcium. Sunlight stimulates the skin to make vitamin D, so be sure to get enough sunlight. If you live in a cold climate or you do not get  outside often, your health care provider may recommend that you take vitamin D supplements. Good sources of vitamin D in your diet include: Egg yolks. Saltwater fish. Milk and cereal fortified with vitamin D. Follow these recommended amounts for daily vitamin D intake: Children and teens, age 8-18: 600 international units. Adults, age 76 or younger: 400-800 international units. Adults, age 39 or older: 800-1,000 international units. Get other important nutrients Other nutrients that are important for bone health include: Phosphorus. This mineral is found in meat, poultry, dairy foods, nuts, and legumes. The recommended daily intake for adult men and adult women is 700 mg. Magnesium. This mineral is found in seeds, nuts, dark green vegetables, and legumes. The recommended daily intake for adult men is 400-420 mg. For adult women, it is 310-320 mg. Vitamin K. This vitamin is found in green leafy vegetables. The recommended daily intake is 120 mg for adult men and 90 mg for adult women. What type of physical activity is best for building and maintaining healthybones? Weight-bearing and strength-building activities are important for building and maintaining healthy bones. Weight-bearing activities cause muscles and bones to work against gravity. Strength-building activities increase the strength of the muscles that support bones. Weight-bearing and muscle-building activities include: Walking and hiking. Jogging and running. Dancing. Gym exercises. Lifting weights. Tennis and racquetball. Climbing stairs. Aerobics. Adults should get at least 30 minutes of moderate physical activity on most days. Children should get at least 60 minutes of moderate physical activity onmost days. Ask your health care provider what type of exercise is best for you. How can I find out if my bone mass is low? Bone mass can be measured with an X-ray test called a bone mineral density (BMD) test. This test is recommended  for all women who are age 22 or older. It may also be recommended for: Men who are age 87 or older. People who are at risk for osteoporosis because of: Having bones that break easily. Having a long-term disease that weakens bones, such as kidney disease or rheumatoid arthritis. Having menopause earlier than normal. Taking medicine that weakens bones, such as steroids, thyroid hormones, or hormone treatment for breast cancer or prostate cancer. Smoking. Drinking three or more alcoholic drinks a day. If  you find that you have a low bone mass, you may be able to preventosteoporosis or further bone loss by changing your diet and lifestyle. Where can I find more information? For more information, check out the following websites: [place]: AviationTales.fr [employer] of Health: www.bones.SouthExposed.es International Osteoporosis Foundation: Administrator.iofbonehealth.org Summary The aging process leads to an overall loss of bone mass in the body, which can increase the likelihood of broken bones and osteoporosis. Eating a well-balanced diet with plenty of calcium and vitamin D will help to protect your bones. Weight-bearing and strength-building activities are also important for building and maintaining strong bones. Bone mass can be measured with an X-ray test called a bone mineral density (BMD) test. This information is not intended to replace advice given to you by your health care provider. Make sure you discuss any questions you have with your healthcare provider. Document Revised: 12/09/2017 Document Reviewed: 12/09/2017 Elsevier Patient Education  2022 [place]. Preventing Osteoporosis, Adult Osteoporosis is a condition that causes the bones to lose density. This means that the bones become thinner, and the normal spaces in bone tissue become larger. Low bone density can make the bones weak and cause them to break moreeasily. Osteoporosis cannot always be  prevented, but you can take steps to lower yourrisk of developing this condition. How can this condition affect me? If you develop osteoporosis, you will be more likely to break bones in your wrist, spine, or hip. Even a minor accident or injury can be enough to break weak bones. The bones will also be slower to heal. Osteoporosis can cause otherproblems as well, such as a stooped posture or trouble with movement. Osteoporosis can occur with aging. As you get older, you may lose bone tissue more quickly, or it may be replaced more slowly. Osteoporosis is more likely to develop if you have poor nutrition or do not get enough calcium or vitamin D. Other lifestyle factors can also play a role. By eating a well-balanced diet and making lifestyle changes, you can help keep your bones strong and healthy,lowering your chances of developing osteoporosis. What can increase my risk? The following factors may make you more likely to develop osteoporosis: Having a family history of the condition. Having poor nutrition or not getting enough calcium or vitamin D. Using certain medicines, such as steroid medicines or anti-seizure medicines. Being any of the following: 59 years of age or older. Female. A woman who has gone through menopause (is postmenopausal). A person who is of European or Asian descent. Using products that contain nicotine or tobacco, such as cigarettes, e-cigarettes, and chewing tobacco. Not being physically active (being sedentary). Having a small body frame. What actions can I take to prevent this? Get enough calcium  Make sure you get enough calcium every day. Calcium is the most important mineral for bone health. Most people can get enough calcium from their diet, but supplements may be recommended for people who are at risk for osteoporosis. Follow these guidelines: If you are age 11 or younger, aim to get 1,000 milligrams (mg) of calcium every day. If you are older than age 39, aim  to get 1,200 mg of calcium every day. Good sources of calcium include: Dairy products, such as low-fat or nonfat milk, cheese, and yogurt. Dark green leafy vegetables, such as bok choy and broccoli. Foods that have had calcium added to them (calcium-fortified foods), such as orange juice, cereal, bread, soy beverages, and tofu products. Nuts, such as almonds. Check nutrition  labels to see how much calcium is in a food or drink.  Get enough vitamin D Try to get enough vitamin D every day. Vitamin D is the most essential vitamin for bone health. It helps the body absorb calcium. Follow these guidelines for how much vitamin D to get from food: If you are age 64 or younger, aim to get at least 600 international units (IU) every day. Your health care provider may suggest more. If you are older than age 26, aim to get at least 800 international units every day. Your health care provider may suggest more. Good sources of vitamin D in your diet include: Egg yolks. Oily fish, such as salmon, sardines, and tuna. Milk and cereal fortified with vitamin D. Your body also makes vitamin D when you are out in the sun. Exposing the bare skin on your face, arms, legs, or back to the sun for no more than 30 minutes a day, 2 times a week is more than enough. Beyond that, make sure you use sunblock to protect your skin from sunburn, which increases your risk for skin cancer. Exercise  Stay active and get exercise every day. Ask your health care provider what types of exercise are best for you. Weight-bearing and strength-building activities are important for building and maintaining healthy bones. Some examples of these types of activities include: Walking and hiking. Jogging and running. Dancing. Gym exercises and lifting weights. Tennis and racquetball. Climbing stairs. Tai chi.  Make other lifestyle changes Do not use any products that contain nicotine or tobacco, such as cigarettes, e-cigarettes, and  chewing tobacco. If you need help quitting, ask your health care provider. Lose weight if you are overweight. If you drink alcohol: Limit how much you use to: 0-1 drink a day for women who are not pregnant. 0-2 drinks a day for men. Be aware of how much alcohol is in your drink. In the [place] one drink equals one 12 oz bottle of beer (355 mL), one 5 oz glass of wine (148 mL), or one 1 oz glass of hard liquor (44 mL). Where to find support If you need help making changes to prevent osteoporosis, talk with your health care provider. You can ask for a referral to a dietitian and a physicaltherapist. Where to find more information Learn more about osteoporosis from: NIH Osteoporosis and Related [place]: www.bones.SouthExposed.es U.S. Office on [employer] Health: [place]BeachSigns.tn [place]: EquipmentWeekly.com.ee Summary Osteoporosis is a condition that causes weak bones that are more likely to break. Eat a healthy diet, making sure you get enough calcium and vitamin D, and stay active by getting regular exercise to help prevent osteoporosis. Other ways to reduce your risk of osteoporosis include maintaining a healthy weight and avoiding alcohol and products that contain nicotine or tobacco. This information is not intended to replace advice given to you by your health care provider. Make sure you discuss any questions you have with your healthcare provider. Document Revised: 04/28/2020 Document Reviewed: 04/28/2020 Elsevier Patient Education  [place].

## 2021-05-23 NOTE — Progress Notes (Signed)
Acute Office Visit  Subjective:    Patient ID: Sarah [name], female    DOB: 1976-03-24, 45 y.o.   MRN: [id number]  CC: right foot pain   HPI Patient is in today for right foot pain. Onset was one-week-ago. Treatment has included Tylenol, Ibuprofen, rest, ice, and elevation. She tells be she hit her right foot fifth toe on metal dog cage one week ago. She became concerned that her second and third toe are now bruised. She has fractured her left foot in the past, required surgical repair with hardware. States her mother and grandmother have osteoporosis. Pt's BMI 20.18. She is a long-time cigarette smoker.   Past Medical History:  Diagnosis Date   Hypercalcemia 01/06/2021   Left breast mass 01/26/2021   Malaise 01/06/2021   Need for tetanus, diphtheria, and acellular pertussis (Tdap) vaccine 01/06/2021   Palpitations 01/26/2021   Polyuria 01/06/2021   Scoliosis    Weight loss 01/02/2021    Past Surgical History:  Procedure Laterality Date   CERVICAL SPINE SURGERY     Left fooscrew and plate x2t      TUBAL LIGATION      Family History  Problem Relation Age of Onset   Heart disease Mother    Hyperlipidemia Mother    Anxiety disorder Sister    GER disease Son    Asthma Son    GER disease Son    Heart disease Son     Social History   Socioeconomic History   Marital status: Single    Spouse name: Not on file   Number of children: Not on file   Years of education: Not on file   Highest education level: Not on file  Occupational History   Not on file  Tobacco Use   Smoking status: Every Day    Packs/day: 0.50    Years: 10.00    Pack years: 5.00    Types: Cigarettes   Smokeless tobacco: Never  Vaping Use   Vaping Use: Never used  Substance and Sexual Activity   Alcohol use: Not Currently   Drug use: Never   Sexual activity: Not Currently  Other Topics Concern   Not on file  Social History Narrative   Not on file   Social Determinants of Health   Financial  Resource Strain: Not on file  Food Insecurity: Not on file  Transportation Needs: Not on file  Physical Activity: Not on file  Stress: Not on file  Social Connections: Not on file  Intimate Partner Violence: Not on file    Outpatient Medications Prior to Visit  Medication Sig Dispense Refill   famotidine (PEPCID) 40 MG tablet Take 40 mg by mouth daily.     furosemide (LASIX) 20 MG tablet Take 1 tablet (20 mg total) by mouth every other day. 45 tablet 3   potassium chloride (KLOR-CON) 10 MEQ tablet Take 1 tablet (10 mEq total) by mouth every other day. 45 tablet 3   No facility-administered medications prior to visit.    Allergies  Allergen Reactions   Mobic [Meloxicam]     Review of Systems  HENT:  Negative for ear pain.   Musculoskeletal:  Positive for arthralgias and gait problem. Negative for back pain and joint swelling.       Right foot pain,  2nd,3rd,4th, and 5th toe  Skin:  Negative for rash.      Objective:    Physical Exam Vitals reviewed.  Constitutional:      Appearance: Normal  appearance.  Pulmonary:     Effort: Pulmonary effort is normal.  Abdominal:     General: Bowel sounds are normal.     Palpations: Abdomen is soft.  Musculoskeletal:        General: Tenderness (right foot) present.  Skin:    General: Skin is warm and dry.     Capillary Refill: Capillary refill takes less than 2 seconds.     Findings: Bruising (2nd and 3rd toes right foot) present.  Neurological:     General: No focal deficit present.     Mental Status: She is alert and oriented to person, place, and time.  Psychiatric:        Mood and Affect: Mood normal.        Behavior: Behavior normal.   BP 100/62 (BP Location: Left Arm, Patient Position: Sitting)   Pulse 78   Temp 98.2 F (36.8 C) (Temporal)   Ht 5' 6" (1.676 m)   Wt 125 lb (56.7 kg)   SpO2 98%   BMI 20.18 kg/m  Wt Readings from Last 3 Encounters:  05/23/21 125 lb (56.7 kg)  05/09/21 121 lb 12.8 oz (55.2 kg)   05/08/21 122 lb (55.3 kg)    Health Maintenance Due  Topic Date Due   Pneumococcal Vaccine 0-64 Years old (1 - PCV) Never done   HIV Screening  Never done   Hepatitis C Screening  Never done   PAP SMEAR-Modifier  Never done   COVID-19 Vaccine (3 - Booster for Pfizer series) 12/19/2020     Lab Results  Component Value Date   TSH 1.300 05/08/2021   Lab Results  Component Value Date   WBC 5.8 05/08/2021   HGB 13.5 05/08/2021   HCT 40.4 05/08/2021   MCV 89 05/08/2021   PLT 265 05/08/2021   Lab Results  Component Value Date   NA 140 05/08/2021   K 4.1 05/08/2021   CO2 24 05/08/2021   GLUCOSE 73 05/08/2021   BUN 8 05/08/2021   CREATININE 0.67 05/08/2021   BILITOT 0.3 05/08/2021   ALKPHOS 59 05/08/2021   AST 16 05/08/2021   ALT 15 05/08/2021   PROT 6.2 05/08/2021   ALBUMIN 4.1 05/08/2021   CALCIUM 9.9 05/08/2021   EGFR 111 05/08/2021    Lab Results  Component Value Date   HGBA1C 5.4 01/02/2021       Assessment & Plan:   1. Right foot pain - DG Foot Complete Right -Rest, ice, elevation -Tylenol/Ibuprofen as needed  2. Family history of osteoporosis -vitamin D and calcium supplements  3. BMI 20.0-20.9, adult  4. Cigarette smoker  -recommend cessation  I, Lauren M Auman,acting as a scribe for  J , NP.,have documented all relevant documentation on the behalf of  J , NP,as directed by   J , NP while in the presence of  J , NP.   I,  J , NP, have reviewed all documentation for this visit. The documentation on 05/23/21 for the exam, diagnosis, procedures, and orders are all accurate and complete.   Follow-up: Pending x-ray   Signed,  , DNP  

## 2021-05-25 ENCOUNTER — Ambulatory Visit: Payer: BC Managed Care – PPO | Admitting: Physician Assistant

## 2021-05-28 DIAGNOSIS — M25511 Pain in right shoulder: Secondary | ICD-10-CM | POA: Diagnosis not present

## 2021-05-28 DIAGNOSIS — M25411 Effusion, right shoulder: Secondary | ICD-10-CM | POA: Diagnosis not present

## 2021-05-28 DIAGNOSIS — M6281 Muscle weakness (generalized): Secondary | ICD-10-CM | POA: Diagnosis not present

## 2021-05-28 DIAGNOSIS — M25611 Stiffness of right shoulder, not elsewhere classified: Secondary | ICD-10-CM | POA: Diagnosis not present

## 2021-06-05 ENCOUNTER — Telehealth: Payer: Self-pay

## 2021-06-05 NOTE — Telephone Encounter (Signed)
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 The papers are only for when she broke her toe.

## 2021-06-06 DIAGNOSIS — N926 Irregular menstruation, unspecified: Secondary | ICD-10-CM | POA: Diagnosis not present

## 2021-06-07 DIAGNOSIS — R634 Abnormal weight loss: Secondary | ICD-10-CM | POA: Diagnosis not present

## 2021-06-07 DIAGNOSIS — K5900 Constipation, unspecified: Secondary | ICD-10-CM | POA: Diagnosis not present

## 2021-06-07 DIAGNOSIS — R1013 Epigastric pain: Secondary | ICD-10-CM | POA: Diagnosis not present

## 2021-06-20 ENCOUNTER — Encounter: Payer: Self-pay | Admitting: Physician Assistant

## 2021-06-23 DIAGNOSIS — I10 Essential (primary) hypertension: Secondary | ICD-10-CM | POA: Diagnosis not present

## 2021-06-23 DIAGNOSIS — R1310 Dysphagia, unspecified: Secondary | ICD-10-CM | POA: Diagnosis not present

## 2021-06-23 DIAGNOSIS — K449 Diaphragmatic hernia without obstruction or gangrene: Secondary | ICD-10-CM | POA: Diagnosis not present

## 2021-06-23 DIAGNOSIS — F17210 Nicotine dependence, cigarettes, uncomplicated: Secondary | ICD-10-CM | POA: Diagnosis not present

## 2021-06-23 DIAGNOSIS — K219 Gastro-esophageal reflux disease without esophagitis: Secondary | ICD-10-CM | POA: Diagnosis not present

## 2021-06-23 DIAGNOSIS — R634 Abnormal weight loss: Secondary | ICD-10-CM | POA: Diagnosis not present

## 2021-06-27 DIAGNOSIS — M7501 Adhesive capsulitis of right shoulder: Secondary | ICD-10-CM | POA: Diagnosis not present

## 2021-06-27 DIAGNOSIS — M7551 Bursitis of right shoulder: Secondary | ICD-10-CM | POA: Diagnosis not present

## 2021-06-27 DIAGNOSIS — S43431A Superior glenoid labrum lesion of right shoulder, initial encounter: Secondary | ICD-10-CM | POA: Diagnosis not present

## 2021-06-27 DIAGNOSIS — M7541 Impingement syndrome of right shoulder: Secondary | ICD-10-CM | POA: Diagnosis not present

## 2021-06-28 DIAGNOSIS — M25511 Pain in right shoulder: Secondary | ICD-10-CM | POA: Diagnosis not present

## 2021-06-28 DIAGNOSIS — M25611 Stiffness of right shoulder, not elsewhere classified: Secondary | ICD-10-CM | POA: Diagnosis not present

## 2021-06-28 DIAGNOSIS — M6281 Muscle weakness (generalized): Secondary | ICD-10-CM | POA: Diagnosis not present

## 2021-06-28 DIAGNOSIS — M25411 Effusion, right shoulder: Secondary | ICD-10-CM | POA: Diagnosis not present

## 2021-06-30 DIAGNOSIS — N939 Abnormal uterine and vaginal bleeding, unspecified: Secondary | ICD-10-CM | POA: Diagnosis not present

## 2021-06-30 DIAGNOSIS — N926 Irregular menstruation, unspecified: Secondary | ICD-10-CM | POA: Diagnosis not present

## 2021-07-05 DIAGNOSIS — G8929 Other chronic pain: Secondary | ICD-10-CM | POA: Diagnosis not present

## 2021-07-05 DIAGNOSIS — M5441 Lumbago with sciatica, right side: Secondary | ICD-10-CM | POA: Diagnosis not present

## 2021-07-07 DIAGNOSIS — M5030 Other cervical disc degeneration, unspecified cervical region: Secondary | ICD-10-CM | POA: Insufficient documentation

## 2021-07-07 DIAGNOSIS — M24111 Other articular cartilage disorders, right shoulder: Secondary | ICD-10-CM | POA: Diagnosis not present

## 2021-07-07 DIAGNOSIS — M7541 Impingement syndrome of right shoulder: Secondary | ICD-10-CM | POA: Insufficient documentation

## 2021-07-07 DIAGNOSIS — G5603 Carpal tunnel syndrome, bilateral upper limbs: Secondary | ICD-10-CM | POA: Insufficient documentation

## 2021-07-13 DIAGNOSIS — M5416 Radiculopathy, lumbar region: Secondary | ICD-10-CM | POA: Diagnosis not present

## 2021-07-13 DIAGNOSIS — M545 Low back pain, unspecified: Secondary | ICD-10-CM | POA: Diagnosis not present

## 2021-07-17 DIAGNOSIS — M7501 Adhesive capsulitis of right shoulder: Secondary | ICD-10-CM | POA: Diagnosis not present

## 2021-07-17 DIAGNOSIS — M7541 Impingement syndrome of right shoulder: Secondary | ICD-10-CM | POA: Diagnosis not present

## 2021-07-17 DIAGNOSIS — M7551 Bursitis of right shoulder: Secondary | ICD-10-CM | POA: Diagnosis not present

## 2021-07-17 DIAGNOSIS — S43431A Superior glenoid labrum lesion of right shoulder, initial encounter: Secondary | ICD-10-CM | POA: Diagnosis not present

## 2021-07-21 DIAGNOSIS — M5441 Lumbago with sciatica, right side: Secondary | ICD-10-CM | POA: Diagnosis not present

## 2021-07-21 DIAGNOSIS — G8929 Other chronic pain: Secondary | ICD-10-CM | POA: Diagnosis not present

## 2021-08-08 ENCOUNTER — Telehealth: Payer: Self-pay | Admitting: *Deleted

## 2021-08-08 ENCOUNTER — Other Ambulatory Visit: Payer: Self-pay | Admitting: Cardiology

## 2021-08-08 DIAGNOSIS — R400 Somnolence: Secondary | ICD-10-CM

## 2021-08-08 DIAGNOSIS — R5383 Other fatigue: Secondary | ICD-10-CM

## 2021-08-08 NOTE — Telephone Encounter (Signed)
Prior Authorization for split night sleep study sent to [employer] via web portal. Tracking Number [id number].  Phone # [phone].

## 2021-08-11 DIAGNOSIS — M47816 Spondylosis without myelopathy or radiculopathy, lumbar region: Secondary | ICD-10-CM | POA: Diagnosis not present

## 2021-08-11 NOTE — Telephone Encounter (Signed)
Received PA approval for sleep study from [employer] [place]. Auth # [id number]. Valid dates 08/08/21 to 10/06/21. Ok to schedule.

## 2021-08-14 DIAGNOSIS — R1310 Dysphagia, unspecified: Secondary | ICD-10-CM | POA: Diagnosis not present

## 2021-08-14 DIAGNOSIS — R1013 Epigastric pain: Secondary | ICD-10-CM | POA: Diagnosis not present

## 2021-08-14 DIAGNOSIS — K219 Gastro-esophageal reflux disease without esophagitis: Secondary | ICD-10-CM | POA: Diagnosis not present

## 2021-08-14 DIAGNOSIS — K5900 Constipation, unspecified: Secondary | ICD-10-CM | POA: Diagnosis not present

## 2021-08-18 DIAGNOSIS — R112 Nausea with vomiting, unspecified: Secondary | ICD-10-CM | POA: Diagnosis not present

## 2021-08-18 DIAGNOSIS — R932 Abnormal findings on diagnostic imaging of liver and biliary tract: Secondary | ICD-10-CM | POA: Diagnosis not present

## 2021-08-18 DIAGNOSIS — K7689 Other specified diseases of liver: Secondary | ICD-10-CM | POA: Diagnosis not present

## 2021-08-18 DIAGNOSIS — R109 Unspecified abdominal pain: Secondary | ICD-10-CM | POA: Diagnosis not present

## 2021-08-18 DIAGNOSIS — R1013 Epigastric pain: Secondary | ICD-10-CM | POA: Diagnosis not present

## 2021-08-28 DIAGNOSIS — M6281 Muscle weakness (generalized): Secondary | ICD-10-CM | POA: Diagnosis not present

## 2021-08-28 DIAGNOSIS — M25611 Stiffness of right shoulder, not elsewhere classified: Secondary | ICD-10-CM | POA: Diagnosis not present

## 2021-08-28 DIAGNOSIS — M25411 Effusion, right shoulder: Secondary | ICD-10-CM | POA: Diagnosis not present

## 2021-08-28 DIAGNOSIS — M25511 Pain in right shoulder: Secondary | ICD-10-CM | POA: Diagnosis not present

## 2021-08-30 DIAGNOSIS — R9389 Abnormal findings on diagnostic imaging of other specified body structures: Secondary | ICD-10-CM | POA: Diagnosis not present

## 2021-08-30 DIAGNOSIS — R112 Nausea with vomiting, unspecified: Secondary | ICD-10-CM | POA: Diagnosis not present

## 2021-09-04 ENCOUNTER — Ambulatory Visit: Payer: BC Managed Care – PPO | Admitting: Physician Assistant

## 2021-09-04 ENCOUNTER — Encounter: Payer: Self-pay | Admitting: Physician Assistant

## 2021-09-04 ENCOUNTER — Other Ambulatory Visit: Payer: Self-pay

## 2021-09-04 VITALS — BP 102/68 | HR 90 | Temp 97.3°F | Ht 66.0 in | Wt 124.6 lb

## 2021-09-04 DIAGNOSIS — R5381 Other malaise: Secondary | ICD-10-CM | POA: Diagnosis not present

## 2021-09-04 DIAGNOSIS — M2559 Pain in other specified joint: Secondary | ICD-10-CM | POA: Diagnosis not present

## 2021-09-04 MED ORDER — CELECOXIB 200 MG PO CAPS: 200.0000 mg | ORAL_CAPSULE | Freq: Two times a day (BID) | ORAL | 2 refills | Status: DC

## 2021-09-04 NOTE — Progress Notes (Signed)
Acute Office Visit  Subjective:    Patient ID: Sarah [name], female    DOB: Oct 16, 1976, 45 y.o.   MRN: [id number]  Chief Complaint  Patient presents with   Back Pain    HPI Patient is in today for complaints of joint pains 'all over' - says that she has neck and back pain (has been seeing orthopedics) as well as pains in feet/hands/arms etc Denies red hot or swollen joints States she is taking $RemoveB'1000mg'TPHHleOf$  of tylenol alternating with $RemoveBeforeDE'800mg'rbpMgWuwuGvefAu$  ibuprofen with minimal to no relief of symptoms  Past Medical History:  Diagnosis Date   Hypercalcemia 01/06/2021   Left breast mass 01/26/2021   Malaise 01/06/2021   Need for tetanus, diphtheria, and acellular pertussis (Tdap) vaccine 01/06/2021   Palpitations 01/26/2021   Polyuria 01/06/2021   Scoliosis    Weight loss 01/02/2021    Past Surgical History:  Procedure Laterality Date   CERVICAL SPINE SURGERY     Left fooscrew and plate x2t      TUBAL LIGATION      Family History  Problem Relation Age of Onset   Heart disease Mother    Hyperlipidemia Mother    Anxiety disorder Sister    GER disease Son    Asthma Son    GER disease Son    Heart disease Son     Social History   Socioeconomic History   Marital status: Single    Spouse name: Not on file   Number of children: Not on file   Years of education: Not on file   Highest education level: Not on file  Occupational History   Not on file  Tobacco Use   Smoking status: Every Day    Packs/day: 0.50    Years: 10.00    Pack years: 5.00    Types: Cigarettes   Smokeless tobacco: Never  Vaping Use   Vaping Use: Never used  Substance and Sexual Activity   Alcohol use: Not Currently   Drug use: Never   Sexual activity: Not Currently  Other Topics Concern   Not on file  Social History Narrative   Not on file   Social Determinants of Health   Financial Resource Strain: Not on file  Food Insecurity: Not on file  Transportation Needs: Not on file  Physical Activity: Not on file   Stress: Not on file  Social Connections: Not on file  Intimate Partner Violence: Not on file    Outpatient Medications Prior to Visit  Medication Sig Dispense Refill   famotidine (PEPCID) 40 MG tablet Take 40 mg by mouth 2 (two) times daily.     No facility-administered medications prior to visit.    Allergies  Allergen Reactions   Meloxicam Other (See Comments)    eyesight, patient can take ibuprofen and Nsaids fine.    Review of Systems    CONSTITUTIONAL: Negative for chills, fatigue, fever, unintentional weight gain and unintentional weight loss.   CARDIOVASCULAR: Negative for chest pain, dizziness, palpitations and pedal edema.  RESPIRATORY: Negative for recent cough and dyspnea.   MSK: see HPI INTEGUMENTARY: Negative for rash.       Objective:    Physical Exam PHYSICAL EXAM:   VS: BP 102/68 (BP Location: Left Arm, Patient Position: Sitting, Cuff Size: Normal)   Pulse 90   Temp (!) 97.3 F (36.3 C) (Temporal)   Ht $R'5\' 6"'rv$  (1.676 m)   Wt 124 lb 9.6 oz (56.5 kg)   SpO2 97%   BMI 20.11 kg/m  GEN: Well nourished, well developed, in no acute distress- malodorous Cardiac: RRR; no murmurs, rubs, or gallops,no edema - no significant varicosities Respiratory:  normal respiratory rate and pattern with no distress - normal breath sounds with no rales, rhonchi, wheezes or rubs MS: no deformity or atrophy  Skin: warm and dry, no rash  Psych: euthymic mood, appropriate affect and demeanor  BP 102/68 (BP Location: Left Arm, Patient Position: Sitting, Cuff Size: Normal)   Pulse 90   Temp (!) 97.3 F (36.3 C) (Temporal)   Ht 5\' 6"  (1.676 m)   Wt 124 lb 9.6 oz (56.5 kg)   SpO2 97%   BMI 20.11 kg/m  Wt Readings from Last 3 Encounters:  09/04/21 124 lb 9.6 oz (56.5 kg)  05/23/21 125 lb (56.7 kg)  05/09/21 121 lb 12.8 oz (55.2 kg)    Health Maintenance Due  Topic Date Due   HIV Screening  Never done   Hepatitis C Screening  Never done   PAP SMEAR-Modifier  Never  done   COVID-19 Vaccine (3 - Booster for Pfizer series) 12/19/2020    There are no preventive care reminders to display for this patient.   Lab Results  Component Value Date   TSH 1.300 05/08/2021   Lab Results  Component Value Date   WBC 5.8 05/08/2021   HGB 13.5 05/08/2021   HCT 40.4 05/08/2021   MCV 89 05/08/2021   PLT 265 05/08/2021   Lab Results  Component Value Date   NA 140 05/08/2021   K 4.1 05/08/2021   CO2 24 05/08/2021   GLUCOSE 73 05/08/2021   BUN 8 05/08/2021   CREATININE 0.67 05/08/2021   BILITOT 0.3 05/08/2021   ALKPHOS 59 05/08/2021   AST 16 05/08/2021   ALT 15 05/08/2021   PROT 6.2 05/08/2021   ALBUMIN 4.1 05/08/2021   CALCIUM 9.9 05/08/2021   EGFR 111 05/08/2021   No results found for: CHOL No results found for: HDL No results found for: LDLCALC No results found for: TRIG No results found for: CHOLHDL Lab Results  Component Value Date   HGBA1C 5.4 01/02/2021       Assessment & Plan:   Problem List Items Addressed This Visit       Other   Malaise   Relevant Orders   CBC with Differential/Platelet   Comprehensive metabolic panel   TSH   Sedimentation rate   ANA   C-reactive protein   Rheumatoid factor   CYCLIC CITRUL PEPTIDE ANTIBODY, IGG/IGA   Other Visit Diagnoses     Pain in other joint    -  Primary   Relevant Orders   CBC with Differential/Platelet   Comprehensive metabolic panel   TSH   Sedimentation rate   ANA   C-reactive protein   Rheumatoid factor   CYCLIC CITRUL PEPTIDE ANTIBODY, IGG/IGA      Meds ordered this encounter  Medications   celecoxib (CELEBREX) 200 MG capsule    Sig: Take 1 capsule (200 mg total) by mouth 2 (two) times daily.    Dispense:  30 capsule    Refill:  2    Order Specific Question:   Supervising Provider    Answer04/05/2021    Orders Placed This Encounter  Procedures   CBC with Differential/Platelet   Comprehensive metabolic panel   TSH   Sedimentation rate    ANA   C-reactive protein   Rheumatoid factor   CYCLIC CITRUL PEPTIDE ANTIBODY, IGG/IGA  Follow-up: Return if symptoms worsen or fail to improve.  An After Visit Summary was printed and given to the patient.  Yetta Flock [facility] [phone]

## 2021-09-05 LAB — TSH: TSH: 2.54 u[IU]/mL (ref 0.450–4.500)

## 2021-09-05 LAB — COMPREHENSIVE METABOLIC PANEL
ALT: 20 IU/L (ref 0–32)
AST: 19 IU/L (ref 0–40)
Albumin/Globulin Ratio: 1.8 (ref 1.2–2.2)
Albumin: 4.3 g/dL (ref 3.8–4.8)
Alkaline Phosphatase: 72 IU/L (ref 44–121)
BUN/Creatinine Ratio: 12 (ref 9–23)
BUN: 9 mg/dL (ref 6–24)
Bilirubin Total: 0.2 mg/dL (ref 0.0–1.2)
CO2: 24 mmol/L (ref 20–29)
Calcium: 10.4 mg/dL — ABNORMAL HIGH (ref 8.7–10.2)
Chloride: 105 mmol/L (ref 96–106)
Creatinine, Ser: 0.74 mg/dL (ref 0.57–1.00)
Globulin, Total: 2.4 g/dL (ref 1.5–4.5)
Glucose: 91 mg/dL (ref 70–99)
Potassium: 4.4 mmol/L (ref 3.5–5.2)
Sodium: 140 mmol/L (ref 134–144)
Total Protein: 6.7 g/dL (ref 6.0–8.5)
eGFR: 102 mL/min/{1.73_m2} (ref >59)

## 2021-09-05 LAB — CBC WITH DIFFERENTIAL/PLATELET
Basophils Absolute: 0 10*3/uL (ref 0.0–0.2)
Basos: 1 %
EOS (ABSOLUTE): 0.2 10*3/uL (ref 0.0–0.4)
Eos: 3 %
Hematocrit: 41.7 % (ref 34.0–46.6)
Hemoglobin: 13.8 g/dL (ref 11.1–15.9)
Immature Grans (Abs): 0 10*3/uL (ref 0.0–0.1)
Immature Granulocytes: 0 %
Lymphocytes Absolute: 2 10*3/uL (ref 0.7–3.1)
Lymphs: 33 %
MCH: 29.9 pg (ref 26.6–33.0)
MCHC: 33.1 g/dL (ref 31.5–35.7)
MCV: 90 fL (ref 79–97)
Monocytes Absolute: 0.6 10*3/uL (ref 0.1–0.9)
Monocytes: 11 %
Neutrophils Absolute: 3.1 10*3/uL (ref 1.4–7.0)
Neutrophils: 52 %
Platelets: 265 10*3/uL (ref 150–450)
RBC: 4.62 x10E6/uL (ref 3.77–5.28)
RDW: 12.7 % (ref 11.7–15.4)
WBC: 5.9 10*3/uL (ref 3.4–10.8)

## 2021-09-05 LAB — RHEUMATOID FACTOR: Rheumatoid fact SerPl-aCnc: <10 IU/mL (ref <14.0)

## 2021-09-05 LAB — CYCLIC CITRUL PEPTIDE ANTIBODY, IGG/IGA: Cyclic Citrullin Peptide Ab: <1 units (ref 0–19)

## 2021-09-05 LAB — SEDIMENTATION RATE: Sed Rate: 3 mm/hr (ref 0–32)

## 2021-09-05 LAB — ANA: Anti Nuclear Antibody (ANA): POSITIVE — AB

## 2021-09-05 LAB — C-REACTIVE PROTEIN: CRP: <1 mg/L (ref 0–10)

## 2021-09-06 ENCOUNTER — Other Ambulatory Visit: Payer: Self-pay | Admitting: Physician Assistant

## 2021-09-06 DIAGNOSIS — R768 Other specified abnormal immunological findings in serum: Secondary | ICD-10-CM

## 2021-09-06 DIAGNOSIS — R5381 Other malaise: Secondary | ICD-10-CM

## 2021-09-06 DIAGNOSIS — M2559 Pain in other specified joint: Secondary | ICD-10-CM

## 2021-09-12 ENCOUNTER — Ambulatory Visit: Payer: BC Managed Care – PPO | Admitting: Cardiology

## 2021-09-14 ENCOUNTER — Telehealth (INDEPENDENT_AMBULATORY_CARE_PROVIDER_SITE_OTHER): Payer: BC Managed Care – PPO | Admitting: Cardiology

## 2021-09-14 ENCOUNTER — Encounter: Payer: Self-pay | Admitting: Cardiology

## 2021-09-14 ENCOUNTER — Telehealth: Payer: Self-pay | Admitting: Cardiology

## 2021-09-14 VITALS — BP 116/75 | HR 87 | Ht 65.0 in | Wt 124.0 lb

## 2021-09-14 DIAGNOSIS — R002 Palpitations: Secondary | ICD-10-CM | POA: Diagnosis not present

## 2021-09-14 DIAGNOSIS — R5383 Other fatigue: Secondary | ICD-10-CM | POA: Diagnosis not present

## 2021-09-14 NOTE — Telephone Encounter (Signed)
Patient has appt today with Dr. Harriet Masson, her son is sick and she can't take him to the sitter.  She wants to know if her appt can be change to a virtual visit.

## 2021-09-14 NOTE — Progress Notes (Signed)
Virtual Visit via Video Note   This visit type was conducted due to national recommendations for restrictions regarding the COVID-19 Pandemic (e.g. social distancing) in an effort to limit this patient's exposure and mitigate transmission in our community.  Due to her co-morbid illnesses, this patient is at least at moderate risk for complications without adequate follow up.  This format is felt to be most appropriate for this patient at this time.  All issues noted in this document were discussed and addressed.  A limited physical exam was performed with this format.  Please refer to the patient's chart for her consent to telehealth for [facility].    Date:  09/14/2021   ID:  Sarah [name], DOB 1975/12/18, MRN [id number]  Patient Location: Home Provider Location: Office/Clinic  Virtual Visit via Video  Note . I connected with the patient today by a   video enabled telemedicine application and verified that I am speaking with the correct person using two identifiers.  PCP:  Marge Duncans, PA-C  Cardiologist:  Berniece Salines, DO Electrophysiologist:  None   Evaluation Performed:  Follow-Up Visit  Chief Complaint:  " I am very fatigue"  History of Present Illness:    Sarah [name] is a 45 y.o. female with left breast mass is pending diagnostic mammogram and ultrasound, will initially presented to be evaluated for palpitation and shortness of breath.  At that time I recommended patient undergo an echocardiogram as well as event monitor.  She did have daytime somnolence and fatigue which I ordered a sleep study.  Unfortunately her in lab sleep study has been canceled due to insurance not covering this and recommend home sleep study.  I saw the patient on March 07, 2021 at that time we discussed her echocardiogram pattern and I inform her of further testing that may need to be done.  She did her lab testing as well as cardiac MRI.  Current MRI did not show any evidence of perfusion defect  as well as no evidence of infiltrative disease.  At her last visit we discussed her testing results.  During her visit she complained of significant fatigue we did blood work as well as commended patient to go to sleep study to make sure sleep apnea was not playing a role.  Unfortunately she had not been able to get a sleep study done.  But since I saw the patient she and her PCP had done further testing which showed concerns for rheumatologic disorder.  She is pending a visit with the rheumatologist on October 12, 2021.  The patient does not have symptoms concerning for COVID-19 infection (fever, chills, cough, or new shortness of breath).    Past Medical History:  Diagnosis Date   Hypercalcemia 01/06/2021   Left breast mass 01/26/2021   Malaise 01/06/2021   Need for tetanus, diphtheria, and acellular pertussis (Tdap) vaccine 01/06/2021   Palpitations 01/26/2021   Polyuria 01/06/2021   Scoliosis    Weight loss 01/02/2021   Past Surgical History:  Procedure Laterality Date   CERVICAL SPINE SURGERY     Left fooscrew and plate x2t      TUBAL LIGATION       Current Meds  Medication Sig   celecoxib (CELEBREX) 200 MG capsule Take 1 capsule (200 mg total) by mouth 2 (two) times daily.   famotidine (PEPCID) 40 MG tablet Take 40 mg by mouth 2 (two) times daily.     Allergies:   Meloxicam   Social History  Tobacco Use   Smoking status: Every Day    Packs/day: 0.50    Years: 10.00    Pack years: 5.00    Types: Cigarettes   Smokeless tobacco: Never  Vaping Use   Vaping Use: Never used  Substance Use Topics   Alcohol use: Not Currently   Drug use: Never     Family Hx: The patient's family history includes Anxiety disorder in her sister; Asthma in her son; GER disease in her son and son; Heart disease in her mother and son; Hyperlipidemia in her mother.  ROS:   Please see the history of present illness.     All other systems reviewed and are negative.   Prior CV studies:   The  following studies were reviewed today:  Cardiac MR Apr 22, 2019 IMPRESSION: 1.  Normal stress perfusion   2.  No evidence of cardiac amyloidosis   3.  Normal LV size, wall thickness, and systolic function (EF 73%)   4.  Normal RV size and systolic function (EF 41%)   5.  No late gadolinium enhancement to suggest myocardial scar ZIO monitor The patient wore the monitor for 14 days starting February 09, 2021. Indication: Palpitations   The minimum heart rate was 49 bpm, maximum heart rate was 50 bpm, and average heart rate was 86 bpm. Predominant underlying rhythm was Sinus Rhythm.   Premature atrial complexes were rare less than 1%. Premature Ventricular complexes were rare less than 1%.   No ventricular tachycardia, no pauses, No AV block and no atrial fibrillation present. No patient triggered events and no diary events are reported.       Conclusion: Normal/unremarkable study with no evidence of significant arrhythmia.     Transthoracic echocardiogram April 2022 IMPRESSIONS   1. Left ventricular ejection fraction, by estimation, is 60 to 65%. The  left ventricle has normal function. The left ventricle has no regional  wall motion abnormalities. Left ventricular diastolic parameters were  normal. Speckled pattern appreciated in  the wall of the interventricular septum. No evidence of left ventricular  hypertrophy.   2. The average left ventricular global longitudinal strain is abnormal (  -11.4 %). The strain pattern have the "cherry on the top" appearance.  Recommend further testing to rule out cardiac amyloid.   3. Right ventricular systolic function is normal. The right ventricular  size is normal. There is normal pulmonary artery systolic pressure.   4. The mitral valve is normal in structure. Trivial mitral valve  regurgitation. No evidence of mitral stenosis.   5. The aortic valve is normal in structure. Aortic valve regurgitation is  not visualized. No aortic  stenosis is present.   6. The inferior vena cava is normal in size with greater than 50%  respiratory variability, suggesting right atrial pressure of 3 mmHg.   Comparison(s): No prior Echocardiogram.   FINDINGS   Left Ventricle: Left ventricular ejection fraction, by estimation, is 60  to 65%. The left ventricle has normal function. The left ventricle has no  regional wall motion abnormalities. The average left ventricular global  longitudinal strain is -11.4 %.  The global longitudinal strain is abnormal. The left ventricular internal  cavity size was normal in size. There is no left ventricular hypertrophy.  Left ventricular diastolic parameters were normal.   Right Ventricle: The right ventricular size is normal. No increase in  right ventricular wall thickness. Right ventricular systolic function is  normal. There is normal pulmonary artery systolic pressure. The tricuspid  regurgitant velocity is 2.46 m/s, and   with an assumed right atrial pressure of 8 mmHg, the estimated right  ventricular systolic pressure is 24.0 mmHg.   Left Atrium: Left atrial size was normal in size.   Right Atrium: Right atrial size was normal in size.   Pericardium: There is no evidence of pericardial effusion.   Mitral Valve: The mitral valve is normal in structure. Trivial mitral  valve regurgitation. No evidence of mitral valve stenosis.   Tricuspid Valve: The tricuspid valve is normal in structure. Tricuspid  valve regurgitation is mild . No evidence of tricuspid stenosis.   Aortic Valve: The aortic valve is normal in structure. Aortic valve  regurgitation is not visualized. No aortic stenosis is present.   Pulmonic Valve: The pulmonic valve was normal in structure. Pulmonic valve  regurgitation is not visualized. No evidence of pulmonic stenosis.   Aorta: The aortic root is normal in size and structure.   Venous: The inferior vena cava is normal in size with greater than 50%   respiratory variability, suggesting right atrial pressure of 3 mmHg.   IAS/Shunts: No atrial level shunt detected by color flow Doppler.     Labs/Other Tests and Data Reviewed:    EKG:  No ECG reviewed.  Recent Labs: 05/08/2021: Magnesium 2.0 09/04/2021: ALT 20; BUN 9; Creatinine, Ser 0.74; Hemoglobin 13.8; Platelets 265; Potassium 4.4; Sodium 140; TSH 2.540   Recent Lipid Panel No results found for: CHOL, TRIG, HDL, CHOLHDL, LDLCALC, LDLDIRECT  Wt Readings from Last 3 Encounters:  09/14/21 124 lb (56.2 kg)  09/04/21 124 lb 9.6 oz (56.5 kg)  05/23/21 125 lb (56.7 kg)     Objective:    Vital Signs:  BP 116/75   Pulse 87   Ht 5\' 5"  (1.651 m)   Wt 124 lb (56.2 kg)   SpO2 96%   BMI 20.63 kg/m     ASSESSMENT & PLAN:    Fatigue In addition to her fatigue she has had multiple generalized pain and swelling which has been improved by the Celebrex.  I do think is a great thing for the patient to be able to follow-up with the rheumatologist.  I do not believe there is any cardiovascular issues that needs to be tested for at this time.  She can follow-up as needed.   COVID-19 Education: The signs and symptoms of COVID-19 were discussed with the patient and how to seek care for testing (follow up with PCP or arrange E-visit).  The importance of social distancing was discussed today.  Time:   Today, I have spent 10 minutes with the patient with telehealth technology discussing the above problems.     Medication Adjustments/Labs and Tests Ordered: Current medicines are reviewed at length with the patient today.  Concerns regarding medicines are outlined above.   Tests Ordered: No orders of the defined types were placed in this encounter.   Medication Changes: No orders of the defined types were placed in this encounter.   Follow Up:  In Person prn  Rolly Pancake, DO  09/14/2021 4:03 PM    [facility]

## 2021-09-14 NOTE — Patient Instructions (Signed)
Medication Instructions:  Your physician recommends that you continue on your current medications as directed. Please refer to the Current Medication list given to you today.  *If you need a refill on your cardiac medications before your next appointment, please call your pharmacy*   Lab Work: None If you have labs (blood work) drawn today and your tests are completely normal, you will receive your results only by: [place] (if you have MyChart) OR A paper copy in the mail If you have any lab test that is abnormal or we need to change your treatment, we will call you to review the results.   Testing/Procedures: None   Follow-Up: At [facility], you and your health needs are our priority.  As part of our continuing mission to provide you with exceptional heart care, we have created designated Provider Care Teams.  These Care Teams include your primary Cardiologist (physician) and Advanced Practice Providers (APPs -  Physician Assistants and Nurse Practitioners) who all work together to provide you with the care you need, when you need it.  We recommend signing up for the patient portal called "MyChart".  Sign up information is provided on this After Visit Summary.  MyChart is used to connect with patients for Virtual Visits (Telemedicine).  Patients are able to view lab/test results, encounter notes, upcoming appointments, etc.  Non-urgent messages can be sent to your provider as well.   To learn more about what you can do with MyChart, go to [email or web address].    Your next appointment:    As needed  The format for your next appointment:   In Person  Provider:   Berniece Salines, DO [id number], [place], [place] [address]    Other Instructions

## 2021-09-14 NOTE — Telephone Encounter (Signed)
Called pt to let her know we changed her appt to a MyChart visit. We went over her medications, her listed was updated by her PCP. Pt aware we will call her before her appt later today to get her vital signs. She verbalized understanding.

## 2021-09-28 DIAGNOSIS — M25611 Stiffness of right shoulder, not elsewhere classified: Secondary | ICD-10-CM | POA: Diagnosis not present

## 2021-09-28 DIAGNOSIS — M25411 Effusion, right shoulder: Secondary | ICD-10-CM | POA: Diagnosis not present

## 2021-09-28 DIAGNOSIS — M25511 Pain in right shoulder: Secondary | ICD-10-CM | POA: Diagnosis not present

## 2021-09-28 DIAGNOSIS — M6281 Muscle weakness (generalized): Secondary | ICD-10-CM | POA: Diagnosis not present

## 2021-10-11 NOTE — Progress Notes (Signed)
Office Visit Note  Patient: Sarah [name]             Date of Birth: December 06, 1975           MRN: [id number]             PCP: Marge Duncans, PA-C Referring: Marge Duncans, PA-C Visit Date: 10/12/2021 Occupation: Dog breeder  Subjective:  New Patient (Initial Visit) (Bil hand tingling and numbness, total body joint pain, muscle pain, bil feet tingling)   History of Present Illness: Sarah [name] is a 45 y.o. female here for joint pains, numbness, fatigue, and weight loss with positive ANA testing.  Symptoms have really been ongoing since last year no specific onset or proceeding infections or medical events that she can recall.  Initially this was accompanied by a generalized body aches and fatigue as well as a progressive unexplained weight loss from her baseline down to as low as 105 pounds.  She has had some amount of chronic joint pain previous evaluations of bilateral rotator cuff arthropathy, mild degenerative disc disease in the lumbar spine, previous right leg fracture with residual swelling intermittently at the ankle and knee.  However these were increased additionally with problems of a lot of stiffness and pain in the morning and getting up from stationary positions particularly in bilateral hips. Particularly notices pain whenever she has to drive for an hour or longer at a time.  These have improved a lot on Celebrex but she feels symptoms quickly worsen again if off the medication. More recently she is also started having muscle cramping particularly in her legs not associated with any particular position or activity.  She also reports numbness and tingling sensation particularly in bilateral hands this occurs overnight and first thing in the morning as well as periodically throughout the day.  She had nerve conduction study for this reportedly showing minimal carpal tunnel syndrome not felt to adequately explain symptoms.  She tried wearing wrist braces for this with no symptom  improvement.  Throughout that time she denied any loss of appetite only gastrointestinal symptom was increase in loose frequent stools not associated with any pain or bleeding.  She described frequent nighttime awakening often every 1-2 hours without specific causes and has severe fatigue and daytime somnolence able to fall asleep unintentionally whenever she stops moving. She denies new hair loss, oral ulcers, raynaud's symptoms, lymphadenopathy, or history of blood clots. She reports dry mouth and dry skin symptoms but no problems with her eyes. 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 She has headaches usually bilateral lasting for up to a few hours at a time. She has urinary urgency with occasional urge incontinence. She does not report allodynia symptoms.  LAbs reviewed 08/2021 ANA pos RF neg CCP neg ESR 3 CRP <1 TSH 2.54 CBC wnl CMP Ca 10.4  Imaging reviewed 04/21/21 MR Cardiac stress test IMPRESSION: 1.  Normal stress perfusion 2.  No evidence of cardiac amyloidosis 3.  Normal LV size, wall thickness, and systolic function (EF 00%) 4.  Normal RV size and systolic function (EF 37%) 5.  No late gadolinium enhancement to suggest myocardial scar   Activities of Daily Living:  Patient reports morning stiffness for 30 minutes.   Patient Reports nocturnal pain.  Difficulty dressing/grooming: Reports Difficulty climbing stairs: Reports Difficulty getting out of chair: Reports Difficulty using hands for taps, buttons, cutlery, and/or writing: Reports  Review of Systems  Constitutional:  Positive for fatigue.  HENT:  Positive for mouth dryness.   Eyes:  Negative for dryness.  Respiratory:  Positive for shortness of breath.   Cardiovascular:  Positive for swelling in legs/feet.  Gastrointestinal:  Positive for diarrhea.  Endocrine: Positive for cold intolerance, excessive thirst and increased urination.  Genitourinary:   Negative for difficulty urinating.  Musculoskeletal:  Positive for joint pain, gait problem, joint pain, joint swelling, muscle weakness, morning stiffness and muscle tenderness.  Skin:  Negative for rash.  Allergic/Immunologic: Negative for susceptible to infections.  Neurological:  Positive for numbness and weakness.  Hematological:  Positive for bruising/bleeding tendency.  Psychiatric/Behavioral:  Positive for sleep disturbance.    PMFS History:  Patient Active Problem List   Diagnosis Date Noted   Positive ANA (antinuclear antibody) 10/12/2021   Impingement syndrome of right shoulder 07/07/2021   Labral tear of shoulder, degenerative, right 07/07/2021   Carpal tunnel syndrome, bilateral 07/07/2021   DDD (degenerative disc disease), cervical 07/07/2021   Other fatigue 05/08/2021   Leg cramps 05/08/2021   Frequency of urination 05/08/2021   Irregular menses 05/08/2021   Scoliosis    Palpitations 01/26/2021   Left breast mass 01/26/2021   Polyuria 01/06/2021   Hypercalcemia 01/06/2021   Malaise 01/06/2021   Need for tetanus, diphtheria, and acellular pertussis (Tdap) vaccine 01/06/2021   Weight loss 01/02/2021   Hyperglycemia 01/02/2021    Past Medical History:  Diagnosis Date   Carpal tunnel syndrome    Hernia, hiatal    Hypercalcemia 01/06/2021   Left breast mass 01/26/2021   Liver cyst    Malaise 01/06/2021   Need for tetanus, diphtheria, and acellular pertussis (Tdap) vaccine 01/06/2021   Palpitations 01/26/2021   Polyuria 01/06/2021   Scoliosis    Weight loss 01/02/2021    Family History  Problem Relation Age of Onset   Heart disease Mother    Hyperlipidemia Mother    Anxiety disorder Sister    GER disease Son    Asthma Son    GER disease Son    Heart disease Son    Past Surgical History:  Procedure Laterality Date   CERVICAL SPINE SURGERY     Left fooscrew and plate P6P      TUBAL LIGATION     Social History   Social History Narrative   Not on  file   Immunization History  Administered Date(s) Administered   PFIZER(Purple Top)SARS-COV-2 Vaccination 06/28/2020, 07/19/2020   Tdap 01/06/2021     Objective: Vital Signs: BP 99/63 (BP Location: Right Arm, Patient Position: Sitting, Cuff Size: Normal)   Pulse (!) 58   Resp 14   Ht 5\' 5"  (1.651 m)   Wt 125 lb (56.7 kg)   BMI 20.80 kg/m    Physical Exam HENT:     Right Ear: External ear normal.     Left Ear: External ear normal.     Mouth/Throat:     Mouth: Mucous membranes are moist.     Pharynx: Oropharynx is clear.     Comments: Several absent or damaged molar teeth Eyes:     Conjunctiva/sclera: Conjunctivae normal.  Cardiovascular:     Rate and Rhythm: Normal rate and regular rhythm.  Pulmonary:     Effort: Pulmonary effort is normal.     Breath sounds: Normal breath sounds.  Musculoskeletal:     Right lower leg: No edema.     Left lower leg: No edema.  Lymphadenopathy:     Cervical: No cervical adenopathy.  Skin:  General: Skin is warm and dry.     Findings: No rash.     Comments: Telangiectasias on face and most numerous on chest, possible lip venous lake, few on bilateral arms Normal appearing nailfold capillaroscopy Dry skin diffusely  Neurological:     General: No focal deficit present.     Mental Status: She is alert.     Motor: No weakness.     Deep Tendon Reflexes: Reflexes normal.  Psychiatric:        Mood and Affect: Mood normal.     Musculoskeletal Exam:  Neck full ROM no tenderness Shoulders full ROM no tenderness or swelling Elbows full ROM no tenderness or swelling Wrists full ROM no tenderness or swelling Fingers full ROM no tenderness or swelling Hip normal internal and external rotation, anterior hip pain provoked with external rotation, no lateral hip tenderness to palpation Knees full ROM no tenderness or swelling Ankles full ROM no tenderness or swelling    Investigation: No additional findings.  Imaging: No results  found.  Recent Labs: Lab Results  Component Value Date   WBC 5.9 09/04/2021   HGB 13.8 09/04/2021   PLT 265 09/04/2021   NA 140 09/04/2021   K 4.4 09/04/2021   CL 105 09/04/2021   CO2 24 09/04/2021   GLUCOSE 91 09/04/2021   BUN 9 09/04/2021   CREATININE 0.74 09/04/2021   BILITOT 0.2 09/04/2021   ALKPHOS 72 09/04/2021   AST 19 09/04/2021   ALT 20 09/04/2021   PROT 6.7 09/04/2021   ALBUMIN 4.3 09/04/2021   CALCIUM 10.4 (H) 09/04/2021   GFRAA 115 01/06/2021    Speciality Comments: No specialty comments available.  Procedures:  No procedures performed Allergies: Meloxicam   Assessment / Plan:     Visit Diagnoses: Positive ANA (antinuclear antibody) - Plan: RNP Antibody, Anti-Smith antibody, Sjogrens syndrome-A extractable nuclear antibody, Anti-DNA antibody, double-stranded, C3 and C4, ANA, CK  I do not see specific clinical signs for inflammatory disease on exam today.  We will check specific antibody work-up for the positive ANA.  We will also check a CK level with complaint of muscle cramping symptoms.  Lower overall pretest suspicion for active inflammatory disease.  She has mild degenerative or injury related joint changes at multiple sites that could be contributing to development or chronic pain sensitization.  We will plan to follow-up for positive results or otherwise for alternative directions for symptomatic management.  Carpal tunnel syndrome, bilateral  Numbness sounds most typical for impingement rather than inflammatory neuropathy, reportedly not significant by NCS and no improvement to splinting though. No obvious deficits by my exam today either.  Frequency of urination  Urinary frequency with urge incontinence does not sound like neurogenic bladder issue. I am not sure if this has been looked into very extensively. Possible that she also has some interstitial cystitis can go along with these other generalized symptoms.  Orders: Orders Placed This Encounter   Procedures   RNP Antibody   Anti-Smith antibody   Sjogrens syndrome-A extractable nuclear antibody   Anti-DNA antibody, double-stranded   C3 and C4   ANA   CK    No orders of the defined types were placed in this encounter.   Follow-Up Instructions: No follow-ups on file.   Collier Salina, MD  Note - This record has been created using [employer].  Chart creation errors have been sought, but may not always  have been located. Such creation errors do not reflect on  the standard  of medical care.

## 2021-10-12 ENCOUNTER — Other Ambulatory Visit: Payer: Self-pay

## 2021-10-12 ENCOUNTER — Ambulatory Visit: Payer: BC Managed Care – PPO | Admitting: Internal Medicine

## 2021-10-12 ENCOUNTER — Encounter: Payer: Self-pay | Admitting: Internal Medicine

## 2021-10-12 VITALS — BP 99/63 | HR 58 | Resp 14 | Ht 65.0 in | Wt 125.0 lb

## 2021-10-12 DIAGNOSIS — R350 Frequency of micturition: Secondary | ICD-10-CM | POA: Diagnosis not present

## 2021-10-12 DIAGNOSIS — R768 Other specified abnormal immunological findings in serum: Secondary | ICD-10-CM | POA: Diagnosis not present

## 2021-10-12 DIAGNOSIS — G5603 Carpal tunnel syndrome, bilateral upper limbs: Secondary | ICD-10-CM

## 2021-10-12 NOTE — Patient Instructions (Signed)
Anti-DNA Antibody Test Why am I having this test? The anti-DNA antibody test helps with the diagnosis and follow-up of systemic lupus erythematosus (SLE). It is also used to monitor treatment of this condition as the antibody decreases with successful therapy. What is being tested? This test measures the amount of anti-DNA antibody in the blood. This antibody is found in 65-80% of patients with active SLE. This antibody is not as common in patients who have other diseases. What kind of sample is taken? A blood sample is required for this test. It is usually collected by inserting a needle into a blood vessel. How are the results reported? Your test results will be reported as a value. Your test results may also be reported as positive, intermediate, or negative. Your health care provider will compare your results to normal ranges that were established after testing a large group of people (reference values). Reference values may vary among labs and hospitals. For this test, common reference values are: Positive: 10 or more international units/mL. Intermediate: 5-9 international units/mL. Negative: Less than 5 international units/mL. What do the results mean? Positive results, which are associated with results that are higher than the reference values, may indicate: Autoimmune disorders such as SLE. Infectious mononucleosis. Chronic liver conditions. Intermediate results mean that the anti-DNA antibody levels are higher than normal, but not high enough to be considered positive. Negative results mean that you do not have the anti-DNA antibody that is associated with these conditions. Talk with your health care provider about what your results mean. Questions to ask your health care provider Ask your health care provider, or the department that is doing the test: When will my results be ready? How will I get my results? What are my treatment options? What other tests do I need? What are my  next steps? Summary The anti-DNA antibody test helps with the diagnosis and follow-up of systemic lupus erythematosus (SLE). It is also used to monitor treatment of this condition as the antibody decreases with successful therapy. This test measures the amount of anti-DNA antibody in the blood. Elevated levels of anti-DNA antibody can be seen in patients with SLE and certain other conditions.  Complement Assay Test Why am I having this test? Complement refers to a group of proteins that are part of the body's disease-fighting system (immune system). A complement assay test provides information about some or all of these proteins. You may have this test: To diagnose a lack, or deficiency, of certain complement proteins. Deficiencies can be passed from parent to child (inherited). To monitor an infection or autoimmune disease. If you have unexplained inflammation or swelling (edema). If you have bacterial infections again and again. What is being tested? This test can be used to measure: Total complement. This is the total number of protein complements in your blood. The number of each kind of complement in your blood. The nine main kinds of complement are labeled C1 through C9. Some of these complements, such as C3 and C4, are especially important and have many functions in the body. Depending on why you are having the test, your health care provider may test your total complement or only some individual complements, such as C3 and C4. The total complement assay test may be done before individual complements are tested. What kind of sample is taken? A blood sample is required for this test. It is usually collected by inserting a needle into a blood vessel. Tell a health care provider about: Any allergies you have.  All medicines you are taking, including vitamins, herbs, eye drops, creams, and over-the-counter medicines. Any blood disorders you have. Any surgeries you have had. Any medical  conditions you have. Whether you are pregnant or may be pregnant. How are the results reported? Your results will be reported as a value that tells you how much complement is in your blood. This will be given as units per milliliter of blood (units/mL) or as milligrams per deciliter of blood (mg/dL). Your results may be reported as total complement, or as individual complements, or both. Your health care provider will compare your results to normal ranges that were established after testing a large group of people (reference ranges). Reference ranges may vary among labs and hospitals. For this test, reference ranges for some of the most commonly measured complement assays may be: Total complement: 30-75 units/mL. C2: 1-4 mg/dL. C3: 75-175 mg/dL. C4: 22-45 units/mL. What do the results mean? Results within reference ranges are considered normal, which means you have a normal amount of complement in your blood. Results that are higher than the reference ranges may be caused by: Inflammatory disease. Heart attack. Cancer. Complement deficiencies, or results lower than the reference ranges, may be caused by: Certain inherited conditions. Autoimmune disease. Certain liver diseases. Malnutrition. Certain types of anemia that result in breakdown of red blood cells (hemolytic anemia). Talk with your health care provider about what your results mean. Questions to ask your health care provider Ask your health care provider, or the department that is doing the test: When will my results be ready? How will I get my results? What are my treatment options? What other tests do I need? What are my next steps? Summary Complement refers to a group of proteins that are part of the body's disease-fighting system (immune system). A complement assay test can provide information about some or all of these proteins. You may have a complement assay test to help diagnose a complement deficiency, and to monitor  some infections or autoimmune disease. Talk with your health care provider about what your results mean.  Antinuclear Antibody Test Why am I having this test? This is a test that is used to help diagnose systemic lupus erythematosus (SLE) and other autoimmune diseases. An autoimmune disease is a disease in which the body's own defense system (immune system) attacks its organs. What is being tested? This test checks for antinuclear antibodies (ANA) in the blood. The presence of ANA is associated with several autoimmune diseases. It is seen in almost all people with lupus. What kind of sample is taken? A blood sample is required for this test. It is usually collected by inserting a needle into a blood vessel. How are the results reported? Your test results will be reported as either positive or negative. What do the results mean? A positive test result may mean that you have: Lupus. Other autoimmune diseases, such as rheumatoid arthritis, scleroderma, or Sjgren syndrome. Talk with your health care provider about what your results mean. In some cases, your health care provider may do more testing to confirm the results. More testing may be done because other conditions can sometimes cause a positive result, such as: Liver dysfunction. Myasthenia gravis. Infectious mononucleosis. Questions to ask your health care provider Ask your health care provider, or the department that is doing the test: When will my results be ready? How will I get my results? What are my treatment options? What other tests do I need? What are my next steps? Summary This  is a test that is used to help diagnose systemic lupus erythematosus (SLE) and other autoimmune diseases. An autoimmune disease is a disease in which the body's own defense system (immune system) attacks the body. This test checks for antinuclear antibodies (ANA) in the blood. The presence of ANA is associated with several autoimmune diseases. It is  seen in almost all people with lupus. Your test results will be reported as either positive or negative. Talk with your health care provider about what your results mean.

## 2021-10-17 ENCOUNTER — Telehealth: Payer: Self-pay

## 2021-10-17 LAB — ANA: Anti Nuclear Antibody (ANA): POSITIVE — AB

## 2021-10-17 LAB — ANTI-NUCLEAR AB-TITER (ANA TITER): ANA Titer 1: 1:40 {titer} — ABNORMAL HIGH

## 2021-10-17 LAB — C3 AND C4
C3 Complement: 105 mg/dL (ref 83–193)
C4 Complement: 21 mg/dL (ref 15–57)

## 2021-10-17 LAB — ANTI-DNA ANTIBODY, DOUBLE-STRANDED: ds DNA Ab: <1 IU/mL

## 2021-10-17 LAB — RNP ANTIBODY: Ribonucleic Protein(ENA) Antibody, IgG: 1.5 AI — AB

## 2021-10-17 LAB — ANTI-SMITH ANTIBODY: ENA SM Ab Ser-aCnc: <1 AI

## 2021-10-17 LAB — CK: Total CK: 103 U/L (ref 29–143)

## 2021-10-17 LAB — SJOGRENS SYNDROME-A EXTRACTABLE NUCLEAR ANTIBODY: SSA (Ro) (ENA) Antibody, IgG: <1 AI

## 2021-10-17 NOTE — Telephone Encounter (Signed)
Letter has been sent to patient instructing them to call us if they are still interested in completing their sleep study. If we have not received a response from the patient within 30 days of this notice, the order will be cancelled and they will need to discuss the need for a sleep study at their next office visit.  ° °

## 2021-10-23 ENCOUNTER — Telehealth: Payer: Self-pay | Admitting: Internal Medicine

## 2021-10-23 NOTE — Telephone Encounter (Signed)
Per patient, she has been taking Celebrex for several months now, but will be out this Saturday. Patient would like to know if doctor wants her to continue with Celebrex? If so, patient needs refill sent [facility] for Celebrex 200mg  2x/ day. Please call to advise.

## 2021-10-23 NOTE — Telephone Encounter (Signed)
I would recommend continuing the celebrex since she has multiple areas of osteoarthritis for which this would be beneficial. This has been prescribed previously with her PCP office with whom she has an appointment tomorrow 11/29 and may be able to refill this.

## 2021-10-24 ENCOUNTER — Ambulatory Visit: Payer: BC Managed Care – PPO | Admitting: Physician Assistant

## 2021-10-24 ENCOUNTER — Encounter: Payer: Self-pay | Admitting: Physician Assistant

## 2021-10-24 VITALS — BP 102/62 | HR 70 | Temp 97.6°F | Ht 65.0 in | Wt 127.0 lb

## 2021-10-24 DIAGNOSIS — J060 Acute laryngopharyngitis: Secondary | ICD-10-CM | POA: Diagnosis not present

## 2021-10-24 LAB — POC COVID19 BINAXNOW: SARS Coronavirus 2 Ag: NEGATIVE

## 2021-10-24 LAB — POCT INFLUENZA A/B
Influenza A, POC: NEGATIVE
Influenza B, POC: NEGATIVE

## 2021-10-24 MED ORDER — PROMETHAZINE-DM 6.25-15 MG/5ML PO SYRP: 5.0000 mL | ORAL_SOLUTION | Freq: Four times a day (QID) | ORAL | 0 refills | Status: DC | PRN

## 2021-10-24 MED ORDER — AZITHROMYCIN 250 MG PO TABS: ORAL_TABLET | ORAL | 0 refills | Status: AC

## 2021-10-24 MED ORDER — CELECOXIB 200 MG PO CAPS: 200.0000 mg | ORAL_CAPSULE | Freq: Every day | ORAL | 0 refills | Status: DC

## 2021-10-24 NOTE — Telephone Encounter (Signed)
Left message to advise patient Dr. Benjamine Mola would recommend continuing the celebrex since she has multiple areas of osteoarthritis for which this would be beneficial. This has been prescribed previously with her PCP office with whom she has an appointment tomorrow 11/29 and may be able to refill this.

## 2021-10-24 NOTE — Progress Notes (Signed)
Acute Office Visit  Subjective:    Patient ID: Sarah [name], female    DOB: 06-Aug-1976, 44 y.o.   MRN: [id number]  Chief Complaint  Patient presents with   Headache    Cough/congestion/drainage    HPI: Patient is in today for cough, congestion, pnd for the past several days - denies fever - has had runny nose and sore throat - family with similar symptoms  Past Medical History:  Diagnosis Date   Carpal tunnel syndrome    Hernia, hiatal    Hypercalcemia 01/06/2021   Left breast mass 01/26/2021   Liver cyst    Malaise 01/06/2021   Need for tetanus, diphtheria, and acellular pertussis (Tdap) vaccine 01/06/2021   Palpitations 01/26/2021   Polyuria 01/06/2021   Scoliosis    Weight loss 01/02/2021    Past Surgical History:  Procedure Laterality Date   CERVICAL SPINE SURGERY     Left fooscrew and plate x2t      TUBAL LIGATION      Family History  Problem Relation Age of Onset   Heart disease Mother    Hyperlipidemia Mother    Anxiety disorder Sister    GER disease Son    Asthma Son    GER disease Son    Heart disease Son     Social History   Socioeconomic History   Marital status: Single    Spouse name: Not on file   Number of children: Not on file   Years of education: Not on file   Highest education level: Not on file  Occupational History   Not on file  Tobacco Use   Smoking status: Every Day    Packs/day: 0.50    Years: 10.00    Pack years: 5.00    Types: Cigarettes   Smokeless tobacco: Never  Vaping Use   Vaping Use: Never used  Substance and Sexual Activity   Alcohol use: Not Currently   Drug use: Never   Sexual activity: Not Currently  Other Topics Concern   Not on file  Social History Narrative   Not on file   Social Determinants of Health   Financial Resource Strain: Not on file  Food Insecurity: Not on file  Transportation Needs: Not on file  Physical Activity: Not on file  Stress: Not on file  Social Connections: Not on file   Intimate Partner Violence: Not on file    Outpatient Medications Prior to Visit  Medication Sig Dispense Refill   famotidine (PEPCID) 40 MG tablet Take 40 mg by mouth 2 (two) times daily.     celecoxib (CELEBREX) 200 MG capsule Take 1 capsule (200 mg total) by mouth 2 (two) times daily. 30 capsule 2   No facility-administered medications prior to visit.    Allergies  Allergen Reactions   Meloxicam Other (See Comments)    eyesight, patient can take ibuprofen and Nsaids fine.    Review of Systems CONSTITUTIONAL: Negative for chills, fatigue, fever, unintentional weight gain and unintentional weight loss.  E/N/T: see hpi CARDIOVASCULAR: Negative for chest pain, dizziness, palpitations and pedal edema.  RESPIRATORY: see hpi GASTROINTESTINAL: Negative for abdominal pain, acid reflux symptoms, constipation, diarrhea, nausea and vomiting.   INTEGUMENTARY: Negative for rash.          Objective:    Physical Exam  BP 102/62 (BP Location: Right Arm, Patient Position: Sitting)   Pulse 70   Temp 97.6 F (36.4 C) (Temporal)   Ht $R'5\' 5"'zv$  (1.651 m)   Wt  127 lb (57.6 kg)   SpO2 99%   BMI 21.13 kg/m  Wt Readings from Last 3 Encounters:  10/24/21 127 lb (57.6 kg)  10/12/21 125 lb (56.7 kg)  09/14/21 124 lb (56.2 kg)   PHYSICAL EXAM:   VS: BP 102/62 (BP Location: Right Arm, Patient Position: Sitting)   Pulse 70   Temp 97.6 F (36.4 C) (Temporal)   Ht 5\' 5"  (1.651 m)   Wt 127 lb (57.6 kg)   SpO2 99%   BMI 21.13 kg/m   GEN: Well nourished, well developed, in no acute distress  HEENT: normal external ears and nose - normal external auditory canals and TMS -  - Lips, Teeth and Gums - normal  Oropharynx - erythema/pnd Cardiac: RRR; no murmurs,  Respiratory:  normal respiratory rate and pattern with no distress - normal breath sounds with no rales, rhonchi, wheezes or rubs   Health Maintenance Due  Topic Date Due   Pneumococcal Vaccine 66-30 Years old (1 - PCV) Never done    HIV Screening  Never done   Hepatitis C Screening  Never done   PAP SMEAR-Modifier  Never done   COVID-19 Vaccine (3 - Booster for Pfizer series) 09/13/2020    There are no preventive care reminders to display for this patient.   Lab Results  Component Value Date   TSH 2.540 09/04/2021   Lab Results  Component Value Date   WBC 5.9 09/04/2021   HGB 13.8 09/04/2021   HCT 41.7 09/04/2021   MCV 90 09/04/2021   PLT 265 09/04/2021   Lab Results  Component Value Date   NA 140 09/04/2021   K 4.4 09/04/2021   CO2 24 09/04/2021   GLUCOSE 91 09/04/2021   BUN 9 09/04/2021   CREATININE 0.74 09/04/2021   BILITOT 0.2 09/04/2021   ALKPHOS 72 09/04/2021   AST 19 09/04/2021   ALT 20 09/04/2021   PROT 6.7 09/04/2021   ALBUMIN 4.3 09/04/2021   CALCIUM 10.4 (H) 09/04/2021   EGFR 102 09/04/2021   No results found for: CHOL No results found for: HDL No results found for: LDLCALC No results found for: TRIG No results found for: CHOLHDL Lab Results  Component Value Date   HGBA1C 5.4 01/02/2021       Assessment & Plan:   Problem List Items Addressed This Visit   None Visit Diagnoses     Acute laryngopharyngitis    -  Primary   Relevant Medications   azithromycin (ZITHROMAX) 250 MG tablet   promethazine-dextromethorphan (PROMETHAZINE-DM) 6.25-15 MG/5ML syrup   Other Relevant Orders   Influenza A/B (Completed)   POC COVID-19 (Completed)      Meds ordered this encounter  Medications   azithromycin (ZITHROMAX) 250 MG tablet    Sig: Take 2 tablets on day 1, then 1 tablet daily on days 2 through 5    Dispense:  6 tablet    Refill:  0    Order Specific Question:   Supervising Provider    Answer:   01-28-1991 Blane Ohara   promethazine-dextromethorphan (PROMETHAZINE-DM) 6.25-15 MG/5ML syrup    Sig: Take 5 mLs by mouth 4 (four) times daily as needed for cough.    Dispense:  118 mL    Refill:  0    Order Specific Question:   Supervising Provider    Answer:   01-28-1991   celecoxib (CELEBREX) 200 MG capsule    Sig: Take 1 capsule (200 mg total) by mouth daily.  Dispense:  30 capsule    Refill:  0    Order Specific Question:   Supervising Provider    AnswerShelton Silvas    Orders Placed This Encounter  Procedures   Influenza A/B   POC COVID-19     Follow-up: Return if symptoms worsen or fail to improve.  An After Visit Summary was printed and given to the patient.  Yetta Flock [facility] [phone]

## 2021-10-25 ENCOUNTER — Encounter: Payer: Self-pay | Admitting: Physician Assistant

## 2021-10-28 DIAGNOSIS — M25511 Pain in right shoulder: Secondary | ICD-10-CM | POA: Diagnosis not present

## 2021-10-28 DIAGNOSIS — M25611 Stiffness of right shoulder, not elsewhere classified: Secondary | ICD-10-CM | POA: Diagnosis not present

## 2021-10-28 DIAGNOSIS — M6281 Muscle weakness (generalized): Secondary | ICD-10-CM | POA: Diagnosis not present

## 2021-10-28 DIAGNOSIS — M25411 Effusion, right shoulder: Secondary | ICD-10-CM | POA: Diagnosis not present

## 2021-10-30 ENCOUNTER — Encounter: Payer: Self-pay | Admitting: Physician Assistant

## 2021-10-30 ENCOUNTER — Ambulatory Visit: Payer: BC Managed Care – PPO | Admitting: Physician Assistant

## 2021-10-30 VITALS — BP 100/70 | HR 91 | Temp 97.2°F | Ht 65.0 in | Wt 122.0 lb

## 2021-10-30 DIAGNOSIS — J060 Acute laryngopharyngitis: Secondary | ICD-10-CM | POA: Diagnosis not present

## 2021-10-30 LAB — POC INFLUENZA A&B (BINAX/QUICKVUE)
Influenza A, POC: NEGATIVE
Influenza B, POC: NEGATIVE

## 2021-10-30 MED ORDER — CEFDINIR 300 MG PO CAPS: 300.0000 mg | ORAL_CAPSULE | Freq: Two times a day (BID) | ORAL | 0 refills | Status: DC

## 2021-10-30 NOTE — Progress Notes (Signed)
Acute Office Visit  Subjective:    Patient ID: Sarah [name], female    DOB: 1975-12-21, 45 y.o.   MRN: [id number]  Chief Complaint  Patient presents with   Nasal Congestion    HPI: Patient is in today for complaints of chills, achiness and headache that started yesterday - says she had some trouble breathing (taking deep breaths) yesterday morning - has not been coughing a lot - has not had a fever Was seen last week for uri and treated with zpack  Past Medical History:  Diagnosis Date   Carpal tunnel syndrome    Hernia, hiatal    Hypercalcemia 01/06/2021   Left breast mass 01/26/2021   Liver cyst    Malaise 01/06/2021   Need for tetanus, diphtheria, and acellular pertussis (Tdap) vaccine 01/06/2021   Palpitations 01/26/2021   Polyuria 01/06/2021   Scoliosis    Weight loss 01/02/2021    Past Surgical History:  Procedure Laterality Date   CERVICAL SPINE SURGERY     Left fooscrew and plate x2t      TUBAL LIGATION      Family History  Problem Relation Age of Onset   Heart disease Mother    Hyperlipidemia Mother    Anxiety disorder Sister    GER disease Son    Asthma Son    GER disease Son    Heart disease Son     Social History   Socioeconomic History   Marital status: Single    Spouse name: Not on file   Number of children: Not on file   Years of education: Not on file   Highest education level: Not on file  Occupational History   Not on file  Tobacco Use   Smoking status: Every Day    Packs/day: 0.50    Years: 10.00    Pack years: 5.00    Types: Cigarettes   Smokeless tobacco: Never  Vaping Use   Vaping Use: Never used  Substance and Sexual Activity   Alcohol use: Not Currently   Drug use: Never   Sexual activity: Not Currently  Other Topics Concern   Not on file  Social History Narrative   Not on file   Social Determinants of Health   Financial Resource Strain: Not on file  Food Insecurity: Not on file  Transportation Needs: Not on  file  Physical Activity: Not on file  Stress: Not on file  Social Connections: Not on file  Intimate Partner Violence: Not on file    Outpatient Medications Prior to Visit  Medication Sig Dispense Refill   celecoxib (CELEBREX) 200 MG capsule Take 1 capsule (200 mg total) by mouth daily. 30 capsule 0   famotidine (PEPCID) 40 MG tablet Take 40 mg by mouth 2 (two) times daily.     promethazine-dextromethorphan (PROMETHAZINE-DM) 6.25-15 MG/5ML syrup Take 5 mLs by mouth 4 (four) times daily as needed for cough. 118 mL 0   No facility-administered medications prior to visit.    Allergies  Allergen Reactions   Meloxicam Other (See Comments)    eyesight, patient can take ibuprofen and Nsaids fine.    Review of Systems CONSTITUTIONAL: see HPI E/N/T: Negative for ear pain, nasal congestion and sore throat.  CARDIOVASCULAR: Negative for chest pain, dizziness, palpitations and pedal edema.  RESPIRATORY: see HPI GASTROINTESTINAL: Negative for abdominal pain, acid reflux symptoms, constipation, diarrhea, nausea and vomiting.  INTEGUMENTARY: Negative for rash.       Objective:    Physical Exam PHYSICAL EXAM:  VS: BP 100/70 (BP Location: Left Arm, Patient Position: Sitting, Cuff Size: Normal)   Pulse 91   Temp (!) 97.2 F (36.2 C) (Temporal)   Ht _0  (1.651 m)   Wt 122 lb (55.3 kg)   SpO2 98%   BMI 20.30 kg/m   GEN: Well nourished, well developed, in no acute distress  HEENT: normal external ears and nose - normal external auditory canals and TMS - - Lips, Teeth and Gums - normal  Oropharynx - normal mucosa, palate, and posterior pharynx Cardiac: RRR; no murmurs, rubs, or gallops,no edem Respiratory:  faint rare exp rhonchi - clears with cough Skin: warm and dry, no rash  Psych: euthymic mood, appropriate affect and demeanor  Office Visit on 10/30/2021  Component Date Value Ref Range Status   Influenza A, POC 10/30/2021 Negative  Negative Final   Influenza B, POC  10/30/2021 Negative  Negative Final    BP 100/70 (BP Location: Left Arm, Patient Position: Sitting, Cuff Size: Normal)   Pulse 91   Temp (!) 97.2 F (36.2 C) (Temporal)   Ht _1  (1.651 m)   Wt 122 lb (55.3 kg)   SpO2 98%   BMI 20.30 kg/m  Wt Readings from Last 3 Encounters:  10/30/21 122 lb (55.3 kg)  10/24/21 127 lb (57.6 kg)  10/12/21 125 lb (56.7 kg)    Health Maintenance Due  Topic Date Due   Pneumococcal Vaccine 2-42 Years old (1 - PCV) Never done   HIV Screening  Never done   Hepatitis C Screening  Never done   PAP SMEAR-Modifier  Never done   COVID-19 Vaccine (3 - Booster for Pfizer series) 09/13/2020    There are no preventive care reminders to display for this patient.   Lab Results  Component Value Date   TSH 2.540 09/04/2021   Lab Results  Component Value Date   WBC 5.9 09/04/2021   HGB 13.8 09/04/2021   HCT 41.7 09/04/2021   MCV 90 09/04/2021   PLT 265 09/04/2021   Lab Results  Component Value Date   NA 140 09/04/2021   K 4.4 09/04/2021   CO2 24 09/04/2021   GLUCOSE 91 09/04/2021   BUN 9 09/04/2021   CREATININE 0.74 09/04/2021   BILITOT 0.2 09/04/2021   ALKPHOS 72 09/04/2021   AST 19 09/04/2021   ALT 20 09/04/2021   PROT 6.7 09/04/2021   ALBUMIN 4.3 09/04/2021   CALCIUM 10.4 (H) 09/04/2021   EGFR 102 09/04/2021   No results found for: CHOL No results found for: HDL No results found for: LDLCALC No results found for: TRIG No results found for: CHOLHDL Lab Results  Component Value Date   HGBA1C 5.4 01/02/2021       Assessment & Plan:   Problem List Items Addressed This Visit   None Visit Diagnoses     Acute laryngopharyngitis    -  Primary   Relevant Orders   POC Influenza A&B(BINAX/QUICKVUE) Otc decongestants       Meds ordered this encounter  Medications   cefdinir (OMNICEF) 300 MG capsule    Sig: Take 1 capsule (300 mg total) by mouth 2 (two) times daily.    Dispense:  20 capsule    Refill:  0    Order Specific  Question:   Supervising Provider    AnswerShelton Silvas     Orders Placed This Encounter  Procedures   POC Influenza A&B(BINAX/QUICKVUE)     Follow-up: Return if symptoms  worsen or fail to improve.  An After Visit Summary was printed and given to the patient.  Yetta Flock [facility] [phone]

## 2021-11-07 ENCOUNTER — Encounter: Payer: Self-pay | Admitting: Physician Assistant

## 2021-11-07 ENCOUNTER — Other Ambulatory Visit: Payer: Self-pay | Admitting: Physician Assistant

## 2021-11-07 ENCOUNTER — Other Ambulatory Visit: Payer: BC Managed Care – PPO

## 2021-11-07 ENCOUNTER — Ambulatory Visit: Payer: BC Managed Care – PPO | Admitting: Physician Assistant

## 2021-11-07 ENCOUNTER — Other Ambulatory Visit: Payer: Self-pay

## 2021-11-08 LAB — PTH, INTACT AND CALCIUM
Calcium: 10.1 mg/dL (ref 8.7–10.2)
PTH: 32 pg/mL (ref 15–65)

## 2021-11-08 NOTE — Progress Notes (Signed)
Office Visit Note  Patient: Sarah [name]             Date of Birth: 1975-12-10           MRN: [id number]             PCP: Marianne Sofia, PA-C Referring: Marianne Sofia, PA-C Visit Date: 11/09/2021   Subjective:  Follow-up (Total body pain)   History of Present Illness: Sarah [name] is a 45 y.o. female here for follow up with multiple symptoms including joint pains, fatigue, numbness, and unintentional weight loss with positive ANA serology. Labs at initial visit showing positive RNP 1.5 otherwise normal CK norma complements low ANA titer 1:40. She continues having ongoing symptoms with pain all over worst in legs at the moment and very fatigued.  Previous HPI 10/12/21 Sarah [name] is a 45 y.o. female here for joint pains, numbness, fatigue, and weight loss with positive ANA testing.  Symptoms have really been ongoing since last year no specific onset or proceeding infections or medical events that she can recall.  Initially this was accompanied by a generalized body aches and fatigue as well as a progressive unexplained weight loss from her baseline down to as low as 105 pounds.  She has had some amount of chronic joint pain previous evaluations of bilateral rotator cuff arthropathy, mild degenerative disc disease in the lumbar spine, previous right leg fracture with residual swelling intermittently at the ankle and knee.  However these were increased additionally with problems of a lot of stiffness and pain in the morning and getting up from stationary positions particularly in bilateral hips. Particularly notices pain whenever she has to drive for an hour or longer at a time.  These have improved a lot on Celebrex but she feels symptoms quickly worsen again if off the medication. More recently she is also started having muscle cramping particularly in her legs not associated with any particular position or activity.  She also reports numbness and tingling sensation particularly in bilateral  hands this occurs overnight and first thing in the morning as well as periodically throughout the day.  She had nerve conduction study for this reportedly showing minimal carpal tunnel syndrome not felt to adequately explain symptoms.  She tried wearing wrist braces for this with no symptom improvement.  Throughout that time she denied any loss of appetite only gastrointestinal symptom was increase in loose frequent stools not associated with any pain or bleeding.  She described frequent nighttime awakening often every 1-2 hours without specific causes and has severe fatigue and daytime somnolence able to fall asleep unintentionally whenever she stops moving. She denies new hair loss, oral ulcers, raynaud's symptoms, lymphadenopathy, or history of blood clots. She reports dry mouth and dry skin symptoms but no problems with her eyes. 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 She has headaches usually bilateral lasting for up to a few hours at a time. She has urinary urgency with occasional urge incontinence. She does not report allodynia symptoms.   LAbs reviewed 08/2021 ANA pos RF neg CCP neg ESR 3 CRP <1 TSH 2.54 CBC wnl CMP Ca 10.4   Imaging reviewed 04/21/21 MR Cardiac stress test IMPRESSION: 1.  Normal stress perfusion 2.  No evidence of cardiac amyloidosis 3.  Normal LV size, wall thickness, and systolic function (EF 62%) 4.  Normal RV size and systolic function (EF 69%) 5.  No late gadolinium enhancement to  suggest myocardial scar   Review of Systems  Constitutional:  Positive for fatigue.  HENT:  Positive for mouth dryness.   Eyes:  Positive for dryness.  Respiratory:  Positive for shortness of breath.   Cardiovascular:  Negative for swelling in legs/feet.  Gastrointestinal:  Negative for constipation.  Endocrine: Positive for cold intolerance, excessive thirst and increased urination.  Genitourinary:  Negative for  difficulty urinating.  Musculoskeletal:  Positive for joint pain, gait problem, joint pain, muscle weakness, morning stiffness and muscle tenderness.  Skin:  Negative for rash.  Allergic/Immunologic: Positive for susceptible to infections.  Neurological:  Positive for numbness and weakness.  Hematological:  Positive for bruising/bleeding tendency.  Psychiatric/Behavioral:  Negative for sleep disturbance.    PMFS History:  Patient Active Problem List   Diagnosis Date Noted   Fibromyalgia syndrome 11/09/2021   Positive ANA (antinuclear antibody) 10/12/2021   Impingement syndrome of right shoulder 07/07/2021   Labral tear of shoulder, degenerative, right 07/07/2021   Carpal tunnel syndrome, bilateral 07/07/2021   DDD (degenerative disc disease), cervical 07/07/2021   Other fatigue 05/08/2021   Leg cramps 05/08/2021   Frequency of urination 05/08/2021   Irregular menses 05/08/2021   Scoliosis    Palpitations 01/26/2021   Left breast mass 01/26/2021   Polyuria 01/06/2021   Hypercalcemia 01/06/2021   Malaise 01/06/2021   Need for tetanus, diphtheria, and acellular pertussis (Tdap) vaccine 01/06/2021   Weight loss 01/02/2021   Hyperglycemia 01/02/2021    Past Medical History:  Diagnosis Date   Carpal tunnel syndrome    Hernia, hiatal    Hypercalcemia 01/06/2021   Left breast mass 01/26/2021   Liver cyst    Malaise 01/06/2021   Need for tetanus, diphtheria, and acellular pertussis (Tdap) vaccine 01/06/2021   Palpitations 01/26/2021   Polyuria 01/06/2021   Scoliosis    Weight loss 01/02/2021    Family History  Problem Relation Age of Onset   Heart disease Mother    Hyperlipidemia Mother    Anxiety disorder Sister    GER disease Son    Asthma Son    GER disease Son    Heart disease Son    Past Surgical History:  Procedure Laterality Date   CERVICAL SPINE SURGERY     Left fooscrew and plate x2t      TUBAL LIGATION     Social History   Social History Narrative    Not on file   Immunization History  Administered Date(s) Administered   PFIZER(Purple Top)SARS-COV-2 Vaccination 06/28/2020, 07/19/2020   Tdap 01/06/2021     Objective: Vital Signs: BP 101/62 (BP Location: Left Arm, Patient Position: Sitting, Cuff Size: Normal)    Pulse 75    Resp 15    Ht $R'5\' 5"'Xh$  (1.651 m)    Wt 125 lb (56.7 kg)    BMI 20.80 kg/m    Physical Exam Eyes:     Conjunctiva/sclera: Conjunctivae normal.  Musculoskeletal:     Right lower leg: No edema.     Left lower leg: No edema.  Skin:    General: Skin is warm and dry.     Findings: No rash.  Neurological:     Mental Status: She is alert.  Psychiatric:        Mood and Affect: Mood normal.     Musculoskeletal Exam:  Shoulders full ROM no tenderness or swelling Elbows full ROM no tenderness or swelling Wrists full ROM no tenderness or swelling Fingers full ROM no tenderness or swelling Knees full ROM  no tenderness or swelling Ankles full ROM no tenderness or swelling   Investigation: No additional findings.  Imaging: No results found.  Recent Labs: Lab Results  Component Value Date   WBC 5.9 09/04/2021   HGB 13.8 09/04/2021   PLT 265 09/04/2021   NA 140 09/04/2021   K 4.4 09/04/2021   CL 105 09/04/2021   CO2 24 09/04/2021   GLUCOSE 91 09/04/2021   BUN 9 09/04/2021   CREATININE 0.74 09/04/2021   BILITOT 0.2 09/04/2021   ALKPHOS 72 09/04/2021   AST 19 09/04/2021   ALT 20 09/04/2021   PROT 6.7 09/04/2021   ALBUMIN 4.3 09/04/2021   CALCIUM 10.1 11/07/2021   GFRAA 115 01/06/2021    Speciality Comments: No specialty comments available.  Procedures:  No procedures performed Allergies: Meloxicam   Assessment / Plan:     Visit Diagnoses: Fibromyalgia syndrome - Plan: DULoxetine (CYMBALTA) 30 MG capsule  Diffuse pain somewhat out of proportion to joint changes or inflammation on exam. Also has severe fatigue and decreased exertion intolerance symptoms appear consistent with fibromyalgia  syndrome currently. Recommend starting cymbalta for symptoms. She previously had sedation effect of medications so trying low dose and see if this is tolerated well. Also recommend reviewing online resources for self management of symptoms besides medication. Agree with continued NSAID treatment for degenerative arthritis at this time as well.  Positive ANA (antinuclear antibody)  Low positive RNP but no obvious inflammatory changes on exam again today. May have some masking with ongoing celebrex but do not think any indication of active autoimmune disease treatment needed. Can monitor for symptom changes.   Orders: No orders of the defined types were placed in this encounter.  Meds ordered this encounter  Medications   DULoxetine (CYMBALTA) 30 MG capsule    Sig: Take 1 capsule (30 mg total) by mouth daily.    Dispense:  30 capsule    Refill:  3      Follow-Up Instructions: Return in about 3 months (around 02/07/2022) for FMS/?UCTD SNRI start f/u 64mos.   Collier Salina, MD  Note - This record has been created using [employer].  Chart creation errors have been sought, but may not always  have been located. Such creation errors do not reflect on  the standard of medical care.

## 2021-11-09 ENCOUNTER — Telehealth: Payer: Self-pay | Admitting: Internal Medicine

## 2021-11-09 ENCOUNTER — Encounter: Payer: Self-pay | Admitting: Internal Medicine

## 2021-11-09 ENCOUNTER — Ambulatory Visit: Payer: BC Managed Care – PPO | Admitting: Internal Medicine

## 2021-11-09 ENCOUNTER — Other Ambulatory Visit: Payer: Self-pay

## 2021-11-09 VITALS — BP 101/62 | HR 75 | Resp 15 | Ht 65.0 in | Wt 125.0 lb

## 2021-11-09 DIAGNOSIS — R768 Other specified abnormal immunological findings in serum: Secondary | ICD-10-CM | POA: Diagnosis not present

## 2021-11-09 DIAGNOSIS — M5030 Other cervical disc degeneration, unspecified cervical region: Secondary | ICD-10-CM | POA: Diagnosis not present

## 2021-11-09 DIAGNOSIS — M797 Fibromyalgia: Secondary | ICD-10-CM | POA: Diagnosis not present

## 2021-11-09 MED ORDER — DULOXETINE HCL 30 MG PO CPEP: 30.0000 mg | ORAL_CAPSULE | Freq: Every day | ORAL | 3 refills | Status: DC

## 2021-11-09 NOTE — Patient Instructions (Signed)
I recommend checking out the [employer] patient-centered guide for fibromyalgia and chronic pain management: [email or web address].med.[employer].edu   

## 2021-11-09 NOTE — Telephone Encounter (Signed)
Patient called the office stating she needed a  work note for her appointment that was today. Patient stated that she wants to know if she needs to stop her celebrex and only take the new medication that was prescribed today or take both tonight.

## 2021-11-10 ENCOUNTER — Encounter: Payer: Self-pay | Admitting: *Deleted

## 2021-11-10 NOTE — Telephone Encounter (Signed)
Attempted to contact the patient and left message for patient to call the office.  

## 2021-11-10 NOTE — Telephone Encounter (Signed)
Patient advised she can start the cymbalta in addition to current celebrex, there is no interaction between these and they work differently. Patient advised Dr. Benjamine Mola printed a work note for her office visit and it's at the front for pick up. Patient asked for it to be sent via mychart. Sent letter and patient confirmed she received it.

## 2021-11-10 NOTE — Telephone Encounter (Signed)
She can start the cymbalta in addition to current celebrex, there is no interaction between these and they work differently. I printed a work note for her office visit.

## 2021-11-25 ENCOUNTER — Other Ambulatory Visit: Payer: Self-pay | Admitting: Physician Assistant

## 2021-12-01 ENCOUNTER — Other Ambulatory Visit: Payer: Self-pay | Admitting: Internal Medicine

## 2021-12-01 DIAGNOSIS — M797 Fibromyalgia: Secondary | ICD-10-CM

## 2022-01-03 ENCOUNTER — Other Ambulatory Visit: Payer: Self-pay | Admitting: Internal Medicine

## 2022-01-03 DIAGNOSIS — M797 Fibromyalgia: Secondary | ICD-10-CM

## 2022-01-08 ENCOUNTER — Other Ambulatory Visit: Payer: Self-pay | Admitting: Physician Assistant

## 2022-01-14 ENCOUNTER — Encounter: Payer: Self-pay | Admitting: Internal Medicine

## 2022-01-25 ENCOUNTER — Other Ambulatory Visit: Payer: Self-pay | Admitting: Physician Assistant

## 2022-01-30 ENCOUNTER — Encounter: Payer: Self-pay | Admitting: Physician Assistant

## 2022-01-30 ENCOUNTER — Other Ambulatory Visit: Payer: Self-pay | Admitting: Physician Assistant

## 2022-01-30 ENCOUNTER — Ambulatory Visit (INDEPENDENT_AMBULATORY_CARE_PROVIDER_SITE_OTHER): Payer: BC Managed Care – PPO | Admitting: Physician Assistant

## 2022-01-30 VITALS — BP 104/78 | HR 107 | Temp 97.5°F | Ht 65.0 in | Wt 122.0 lb

## 2022-01-30 DIAGNOSIS — J40 Bronchitis, not specified as acute or chronic: Secondary | ICD-10-CM

## 2022-01-30 LAB — POCT INFLUENZA A/B
Influenza A, POC: NEGATIVE
Influenza B, POC: NEGATIVE

## 2022-01-30 LAB — POC COVID19 BINAXNOW: SARS Coronavirus 2 Ag: NEGATIVE

## 2022-01-30 MED ORDER — PROMETHAZINE-DM 6.25-15 MG/5ML PO SYRP: 5.0000 mL | ORAL_SOLUTION | Freq: Four times a day (QID) | ORAL | 0 refills | Status: DC | PRN

## 2022-01-30 MED ORDER — LEVOFLOXACIN 500 MG PO TABS: 500.0000 mg | ORAL_TABLET | Freq: Every day | ORAL | 0 refills | Status: AC

## 2022-01-30 NOTE — Progress Notes (Signed)
? ?Acute Office Visit ? ?Subjective:  ? ? Patient ID: Sarah [name], female    DOB: October 24, 1976, 46 y.o.   MRN: [id number] ? ?Chief Complaint  ?Patient presents with  ? Cough  ?  Congestion/achy  ? ? ?HPI: ?Patient is in today for complaints of cough, congestion and sore throat since yesterday .  She has had general malaise and low grade fever - cough has been productive ? ?Past Medical History:  ?Diagnosis Date  ? Carpal tunnel syndrome   ? Hernia, hiatal   ? Hypercalcemia 01/06/2021  ? Left breast mass 01/26/2021  ? Liver cyst   ? Malaise 01/06/2021  ? Need for tetanus, diphtheria, and acellular pertussis (Tdap) vaccine 01/06/2021  ? Palpitations 01/26/2021  ? Polyuria 01/06/2021  ? Scoliosis   ? Weight loss 01/02/2021  ? ? ?Past Surgical History:  ?Procedure Laterality Date  ? CERVICAL SPINE SURGERY    ? Left fooscrew and plate x2t     ? TUBAL LIGATION    ? ? ?Family History  ?Problem Relation Age of Onset  ? Heart disease Mother   ? Hyperlipidemia Mother   ? Anxiety disorder Sister   ? GER disease Son   ? Asthma Son   ? GER disease Son   ? Heart disease Son   ? ? ?Social History  ? ?Socioeconomic History  ? Marital status: Single  ?  Spouse name: Not on file  ? Number of children: Not on file  ? Years of education: Not on file  ? Highest education level: Not on file  ?Occupational History  ? Not on file  ?Tobacco Use  ? Smoking status: Every Day  ?  Packs/day: 0.50  ?  Years: 10.00  ?  Pack years: 5.00  ?  Types: Cigarettes  ? Smokeless tobacco: Never  ?Vaping Use  ? Vaping Use: Never used  ?Substance and Sexual Activity  ? Alcohol use: Not Currently  ? Drug use: Never  ? Sexual activity: Not Currently  ?Other Topics Concern  ? Not on file  ?Social History Narrative  ? Not on file  ? ?Social Determinants of Health  ? ?Financial Resource Strain: Not on file  ?Food Insecurity: Not on file  ?Transportation Needs: Not on file  ?Physical Activity: Not on file  ?Stress: Not on file  ?Social Connections: Not on file   ?Intimate Partner Violence: Not on file  ? ? ?Outpatient Medications Prior to Visit  ?Medication Sig Dispense Refill  ? celecoxib (CELEBREX) 200 MG capsule TAKE 1 CAPSULE BY MOUTH EVERY DAY 30 capsule 0  ? DULoxetine (CYMBALTA) 60 MG capsule Take 60 mg by mouth daily.    ? famotidine (PEPCID) 40 MG tablet Take 40 mg by mouth 2 (two) times daily.    ? DULoxetine (CYMBALTA) 30 MG capsule Take 1 capsule (30 mg total) by mouth daily. 30 capsule 3  ? cefdinir (OMNICEF) 300 MG capsule Take 1 capsule (300 mg total) by mouth 2 (two) times daily. 20 capsule 0  ? promethazine-dextromethorphan (PROMETHAZINE-DM) 6.25-15 MG/5ML syrup Take 5 mLs by mouth 4 (four) times daily as needed for cough. 118 mL 0  ? ?No facility-administered medications prior to visit.  ? ? ?Allergies  ?Allergen Reactions  ? Meloxicam Other (See Comments)  ?  eyesight, patient can take ibuprofen and Nsaids fine.  ? ? ?Review of Systems ?CONSTITUTIONAL: see HPI  ?E/N/T: see HPI ?CARDIOVASCULAR: Negative for chest pain, dizziness, palpitations and pedal edema.  ?RESPIRATORY: see HPI ?GASTROINTESTINAL:  Negative for abdominal pain, acid reflux symptoms, constipation, diarrhea, nausea and vomiting.  ? ? ?   ?Objective:  ?  ?Physical Exam ?PHYSICAL EXAM:  ? ?VS: BP 104/78 (BP Location: Left Arm, Patient Position: Sitting)   Pulse (!) 107   Temp (!) 97.5 ?F (36.4 ?C) (Oral)   Ht _0  (1.651 m)   Wt 122 lb (55.3 kg)   SpO2 93%   BMI 20.30 kg/m?  ? ?GEN: Well nourished, well developed, in no acute distress  ?HEENT: normal external ears and nose - normal external auditory canals and TMS - - Lips, Teeth and Gums - normal  ?Oropharynx - erythema/pnd ?Cardiac: RRR; no murmurs, rubs, or gallops,no edema -  ?Respiratory:  faint scattered rhonchi - clears with cough ?Skin: warm and dry, no rash  ?Psych: euthymic mood, appropriate affect and demeanor ? ?Office Visit on 01/30/2022  ?Component Date Value Ref Range Status  ? SARS Coronavirus 2 Ag 01/30/2022 Negative   Negative Final  ? Influenza A, POC 01/30/2022 Negative  Negative Final  ? Influenza B, POC 01/30/2022 Negative  Negative Final  ? ? ?BP 104/78 (BP Location: Left Arm, Patient Position: Sitting)   Pulse (!) 107   Temp (!) 97.5 ?F (36.4 ?C) (Oral)   Ht _1  (1.651 m)   Wt 122 lb (55.3 kg)   SpO2 93%   BMI 20.30 kg/m?  ?Wt Readings from Last 3 Encounters:  ?01/30/22 122 lb (55.3 kg)  ?11/09/21 125 lb (56.7 kg)  ?10/30/21 122 lb (55.3 kg)  ? ? ?Health Maintenance Due  ?Topic Date Due  ? HIV Screening  Never done  ? Hepatitis C Screening  Never done  ? PAP SMEAR-Modifier  Never done  ? COVID-19 Vaccine (3 - Booster for Pfizer series) 09/13/2020  ? ? ?There are no preventive care reminders to display for this patient. ? ? ?Lab Results  ?Component Value Date  ? TSH 2.540 09/04/2021  ? ?Lab Results  ?Component Value Date  ? WBC 5.9 09/04/2021  ? HGB 13.8 09/04/2021  ? HCT 41.7 09/04/2021  ? MCV 90 09/04/2021  ? PLT 265 09/04/2021  ? ?Lab Results  ?Component Value Date  ? NA 140 09/04/2021  ? K 4.4 09/04/2021  ? CO2 24 09/04/2021  ? GLUCOSE 91 09/04/2021  ? BUN 9 09/04/2021  ? CREATININE 0.74 09/04/2021  ? BILITOT 0.2 09/04/2021  ? ALKPHOS 72 09/04/2021  ? AST 19 09/04/2021  ? ALT 20 09/04/2021  ? PROT 6.7 09/04/2021  ? ALBUMIN 4.3 09/04/2021  ? CALCIUM 10.1 11/07/2021  ? EGFR 102 09/04/2021  ? ?No results found for: CHOL ?No results found for: HDL ?No results found for: [place] ?No results found for: TRIG ?No results found for: CHOLHDL ?Lab Results  ?Component Value Date  ? HGBA1C 5.4 01/02/2021  ? ? ?   ?Assessment & Plan:  ? ?Problem List Items Addressed This Visit   ?None ?Visit Diagnoses   ? ? Bronchitis    -  Primary  ? Relevant Medications  ? levofloxacin (LEVAQUIN) 500 MG tablet  ? promethazine-dextromethorphan (PROMETHAZINE-DM) 6.25-15 MG/5ML syrup  ? Other Relevant Orders  ? POC COVID-19 BinaxNow (Completed)  ? POCT Influenza A/B (Completed)  ? ?  ? ?Meds ordered this encounter  ?Medications  ? levofloxacin  (LEVAQUIN) 500 MG tablet  ?  Sig: Take 1 tablet (500 mg total) by mouth daily for 10 days.  ?  Dispense:  10 tablet  ?  Refill:  0  ?  Order Specific Question:   Supervising Provider  ?  AnswerRochel Brome [[id number]]  ? promethazine-dextromethorphan (PROMETHAZINE-DM) 6.25-15 MG/5ML syrup  ?  Sig: Take 5 mLs by mouth 4 (four) times daily as needed.  ?  Dispense:  118 mL  ?  Refill:  0  ?  Order Specific Question:   Supervising Provider  ?  AnswerRochel Brome [[id number]]  ? ? ?Orders Placed This Encounter  ?Procedures  ? POC COVID-19 BinaxNow  ? POCT Influenza A/B  ?  ? ?Follow-up: Return if symptoms worsen or fail to improve. ? ?An After Visit Summary was printed and given to the patient. ? ?SARA R , PA-C ?[facility] ?[phone] ?

## 2022-02-07 NOTE — Progress Notes (Signed)
? ?Office Visit Note ? ?Patient: Sarah [name]             ?Date of Birth: 1976/11/21           ?MRN: [id number]             ?PCP: Marge Duncans, PA-C ?Referring: Marge Duncans, PA-C ?Visit Date: 02/08/2022 ? ? ?Subjective:  ? ?History of Present Illness: Sarah [name] is a 46 y.o. female here for follow up with joint pains and myofascial pain after starting cymbalta and titrate to 60 mg and with celebrex 200 mg BID. She noticed an improvement in pain of both legs after increasing the cymbalta dose to 60 mg. She has intermittent hand numbness continuing to come and go in both hands, bothering her a bit more than before. She reports one incident of using too hot water for her son with inability to feel temperature. ? ?Previous HPI ?11/09/21 ?[name] is a 46 y.o. female here for follow up with multiple symptoms including joint pains, fatigue, numbness, and unintentional weight loss with positive ANA serology. Labs at initial visit showing positive RNP 1.5 otherwise normal CK norma complements low ANA titer 1:40. She continues having ongoing symptoms with pain all over worst in legs at the moment and very fatigued. ?  ?Previous HPI ?10/12/21 ?[name] is a 46 y.o. female here for joint pains, numbness, fatigue, and weight loss with positive ANA testing.  Symptoms have really been ongoing since last year no specific onset or proceeding infections or medical events that she can recall.  Initially this was accompanied by a generalized body aches and fatigue as well as a progressive unexplained weight loss from her baseline down to as low as 105 pounds.  She has had some amount of chronic joint pain previous evaluations of bilateral rotator cuff arthropathy, mild degenerative disc disease in the lumbar spine, previous right leg fracture with residual swelling intermittently at the ankle and knee.  However these were increased additionally with problems of a lot of stiffness and pain in the morning and getting  up from stationary positions particularly in bilateral hips. Particularly notices pain whenever she has to drive for an hour or longer at a time.  These have improved a lot on Celebrex but she feels symptoms quickly worsen again if off the medication. ?More recently she is also started having muscle cramping particularly in her legs not associated with any particular position or activity.  She also reports numbness and tingling sensation particularly in bilateral hands this occurs overnight and first thing in the morning as well as periodically throughout the day.  She had nerve conduction study for this reportedly showing minimal carpal tunnel syndrome not felt to adequately explain symptoms.  She tried wearing wrist braces for this with no symptom improvement.  Throughout that time she denied any loss of appetite only gastrointestinal symptom was increase in loose frequent stools not associated with any pain or bleeding.  She described frequent nighttime awakening often every 1-2 hours without specific causes and has severe fatigue and daytime somnolence able to fall asleep unintentionally whenever she stops moving. ?She denies new hair loss, oral ulcers, raynaud's symptoms, lymphadenopathy, or history of blood clots. She reports dry mouth and dry skin symptoms but no problems with her eyes. 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 She has headaches usually bilateral lasting for up to a few hours at a time. She  has urinary urgency with occasional urge incontinence. She does not report allodynia symptoms. ?  ?LAbs reviewed ?08/2021 ?ANA pos ?RF neg ?CCP neg ?ESR 3 ?CRP <1 ?TSH 2.54 ?CBC wnl ?CMP Ca 10.4 ?  ?Imaging reviewed ?04/21/21 MR Cardiac stress test ?IMPRESSION: ?1.  Normal stress perfusion ?2.  No evidence of cardiac amyloidosis ?3.  Normal LV size, wall thickness, and systolic function (EF 02%) ?4.  Normal RV size and systolic function (EF  58%) ?5.  No late gadolinium enhancement to suggest myocardial scar ? ? ?Review of Systems  ?Constitutional:  Positive for fatigue.  ?HENT:  Positive for mouth dryness and nose dryness. Negative for mouth sores.   ?Eyes:  Positive for itching. Negative for pain and dryness.  ?Respiratory:  Negative for shortness of breath and difficulty breathing.   ?Cardiovascular:  Positive for palpitations. Negative for chest pain.  ?Gastrointestinal:  Negative for blood in stool, constipation and diarrhea.  ?Endocrine: Negative for increased urination.  ?Genitourinary:  Negative for difficulty urinating.  ?Musculoskeletal:  Positive for joint pain, joint pain, joint swelling and morning stiffness.  ?Skin:  Positive for redness. Negative for color change and rash.  ?Allergic/Immunologic: Negative for susceptible to infections.  ?Neurological:  Positive for dizziness, numbness, headaches, memory loss and weakness.  ?Hematological:  Negative for bruising/bleeding tendency.  ?Psychiatric/Behavioral:  Negative for confusion.   ? ?PMFS History:  ?Patient Active Problem List  ? Diagnosis Date Noted  ? Facial rash 02/08/2022  ? Fibromyalgia syndrome 11/09/2021  ? Positive ANA (antinuclear antibody) 10/12/2021  ? Impingement syndrome of right shoulder 07/07/2021  ? Labral tear of shoulder, degenerative, right 07/07/2021  ? Carpal tunnel syndrome, bilateral 07/07/2021  ? DDD (degenerative disc disease), cervical 07/07/2021  ? Other fatigue 05/08/2021  ? Leg cramps 05/08/2021  ? Frequency of urination 05/08/2021  ? Irregular menses 05/08/2021  ? Scoliosis   ? Palpitations 01/26/2021  ? Left breast mass 01/26/2021  ? Polyuria 01/06/2021  ? Hypercalcemia 01/06/2021  ? Malaise 01/06/2021  ? Need for tetanus, diphtheria, and acellular pertussis (Tdap) vaccine 01/06/2021  ? Weight loss 01/02/2021  ? Hyperglycemia 01/02/2021  ?  ?Past Medical History:  ?Diagnosis Date  ? Carpal tunnel syndrome   ? Hernia, hiatal   ? Hypercalcemia 01/06/2021  ?  Left breast mass 01/26/2021  ? Liver cyst   ? Malaise 01/06/2021  ? Need for tetanus, diphtheria, and acellular pertussis (Tdap) vaccine 01/06/2021  ? Palpitations 01/26/2021  ? Polyuria 01/06/2021  ? Scoliosis   ? Weight loss 01/02/2021  ?  ?Family History  ?Problem Relation Age of Onset  ? Heart disease Mother   ? Hyperlipidemia Mother   ? Anxiety disorder Sister   ? GER disease Son   ? Asthma Son   ? GER disease Son   ? Heart disease Son   ? ?Past Surgical History:  ?Procedure Laterality Date  ? CERVICAL SPINE SURGERY    ? Left fooscrew and plate x2t     ? TUBAL LIGATION    ? ?Social History  ? ?Social History Narrative  ? Not on file  ? ?Immunization History  ?Administered Date(s) Administered  ? PFIZER(Purple Top)SARS-COV-2 Vaccination 06/28/2020, 07/19/2020  ? Tdap 01/06/2021  ?  ? ?Objective: ?Vital Signs: BP 114/70 (BP Location: Left Arm, Patient Position: Sitting, Cuff Size: Normal)   Pulse 80   Ht $R'5\' 5"'iO$  (1.651 m)   Wt 121 lb 6.4 oz (55.1 kg)   BMI 20.20 kg/m?   ? ?Physical Exam ?Eyes:  ?  Conjunctiva/sclera: Conjunctivae normal.  ?Cardiovascular:  ?   Rate and Rhythm: Normal rate and regular rhythm.  ?Pulmonary:  ?   Effort: Pulmonary effort is normal.  ?   Breath sounds: Normal breath sounds.  ?Musculoskeletal:  ?   Right lower leg: No edema.  ?   Left lower leg: No edema.  ?Skin: ?   General: Skin is warm and dry.  ?Neurological:  ?   Mental Status: She is alert.  ?Psychiatric:     ?   Mood and Affect: Mood normal.  ?  ? ?Musculoskeletal Exam:  ?Shoulders full ROM no tenderness or swelling ?Elbows full ROM no tenderness or swelling ?Wrists full ROM no tenderness or swelling ?Fingers full ROM no tenderness or swelling ?Knees full ROM no tenderness or swelling ?Ankles full ROM no tenderness or swelling ? ?Limited ultrasound exam of wrists showing right median nerve CSA 13 mm^2 left 12 mm^2 ? ?Investigation: ?No additional findings. ? ?Imaging: ?No results found. ? ?Recent Labs: ?Lab Results   ?Component Value Date  ? WBC 5.9 09/04/2021  ? HGB 13.8 09/04/2021  ? PLT 265 09/04/2021  ? NA 140 09/04/2021  ? K 4.4 09/04/2021  ? CL 105 09/04/2021  ? CO2 24 09/04/2021  ? GLUCOSE 91 09/04/2021  ? BUN 9 09/04/2021

## 2022-02-08 ENCOUNTER — Ambulatory Visit (INDEPENDENT_AMBULATORY_CARE_PROVIDER_SITE_OTHER): Payer: BC Managed Care – PPO | Admitting: Internal Medicine

## 2022-02-08 ENCOUNTER — Encounter: Payer: Self-pay | Admitting: Internal Medicine

## 2022-02-08 ENCOUNTER — Other Ambulatory Visit: Payer: Self-pay

## 2022-02-08 VITALS — BP 114/70 | HR 80 | Ht 65.0 in | Wt 121.4 lb

## 2022-02-08 DIAGNOSIS — G5603 Carpal tunnel syndrome, bilateral upper limbs: Secondary | ICD-10-CM | POA: Diagnosis not present

## 2022-02-08 DIAGNOSIS — R634 Abnormal weight loss: Secondary | ICD-10-CM

## 2022-02-08 DIAGNOSIS — M797 Fibromyalgia: Secondary | ICD-10-CM

## 2022-02-08 DIAGNOSIS — R21 Rash and other nonspecific skin eruption: Secondary | ICD-10-CM

## 2022-02-08 DIAGNOSIS — R768 Other specified abnormal immunological findings in serum: Secondary | ICD-10-CM | POA: Diagnosis not present

## 2022-02-08 MED ORDER — DULOXETINE HCL 60 MG PO CPEP: 60.0000 mg | ORAL_CAPSULE | Freq: Every day | ORAL | 2 refills | Status: DC

## 2022-02-09 LAB — BASIC METABOLIC PANEL WITH GFR
BUN/Creatinine Ratio: 8 (calc) (ref 6–22)
BUN: 6 mg/dL — ABNORMAL LOW (ref 7–25)
CO2: 26 mmol/L (ref 20–32)
Calcium: 9.9 mg/dL (ref 8.6–10.2)
Chloride: 105 mmol/L (ref 98–110)
Creat: 0.72 mg/dL (ref 0.50–0.99)
Glucose, Bld: 86 mg/dL (ref 65–99)
Potassium: 4.5 mmol/L (ref 3.5–5.3)
Sodium: 140 mmol/L (ref 135–146)
eGFR: 106 mL/min/{1.73_m2} (ref >=60)

## 2022-02-09 LAB — RNP ANTIBODY: Ribonucleic Protein(ENA) Antibody, IgG: 1.2 AI — AB

## 2022-03-06 ENCOUNTER — Encounter: Payer: Self-pay | Admitting: Physician Assistant

## 2022-03-06 ENCOUNTER — Ambulatory Visit: Payer: BC Managed Care – PPO | Admitting: Physician Assistant

## 2022-03-06 VITALS — BP 106/70 | HR 72 | Temp 97.2°F | Resp 14 | Ht 65.0 in | Wt 121.8 lb

## 2022-03-06 DIAGNOSIS — Z1231 Encounter for screening mammogram for malignant neoplasm of breast: Secondary | ICD-10-CM

## 2022-03-06 DIAGNOSIS — M797 Fibromyalgia: Secondary | ICD-10-CM | POA: Diagnosis not present

## 2022-03-06 DIAGNOSIS — R5383 Other fatigue: Secondary | ICD-10-CM | POA: Diagnosis not present

## 2022-03-06 DIAGNOSIS — D229 Melanocytic nevi, unspecified: Secondary | ICD-10-CM

## 2022-03-06 DIAGNOSIS — K219 Gastro-esophageal reflux disease without esophagitis: Secondary | ICD-10-CM | POA: Diagnosis not present

## 2022-03-06 NOTE — Progress Notes (Addendum)
? ?Subjective:  ?Patient ID: Sarah [name], female    DOB: 11-21-1976  Age: 46 y.o. MRN: [id number] ? ?Chief Complaint  ?Patient presents with  ? Fatigue  ? ? ?HPI ? Pt states that she has felt fatigued for past several weeks - nothing has changed as far as history, no trouble sleeping, no change in meds ? ?Pt with history of fibromyalgia - currently on cymbalta '60mg'$  qd and doing well on that medication ? ?Pt with history of GERD - stable on pepcid ? ?Pt being followed by rheumatologist Dr Benjamine Mola - is taking celebrex '200mg'$  for OA ? ?Pt has atypical lesion on chest - changing colors - would like dermatology referral ? ?Pt would like to get mammogram scheduled ?Current Outpatient Medications on File Prior to Visit  ?Medication Sig Dispense Refill  ? celecoxib (CELEBREX) 200 MG capsule TAKE 1 CAPSULE BY MOUTH TWICE A DAY 30 capsule 2  ? DULoxetine (CYMBALTA) 60 MG capsule Take 1 capsule (60 mg total) by mouth daily. 30 capsule 2  ? famotidine (PEPCID) 40 MG tablet Take 40 mg by mouth 2 (two) times daily.    ? ?No current facility-administered medications on file prior to visit.  ? ?Past Medical History:  ?Diagnosis Date  ? Carpal tunnel syndrome   ? Hernia, hiatal   ? Hypercalcemia 01/06/2021  ? Left breast mass 01/26/2021  ? Liver cyst   ? Malaise 01/06/2021  ? Need for tetanus, diphtheria, and acellular pertussis (Tdap) vaccine 01/06/2021  ? Palpitations 01/26/2021  ? Polyuria 01/06/2021  ? Scoliosis   ? Weight loss 01/02/2021  ? ?Past Surgical History:  ?Procedure Laterality Date  ? CERVICAL SPINE SURGERY    ? Left fooscrew and plate x2t     ? TUBAL LIGATION    ?  ?Family History  ?Problem Relation Age of Onset  ? Heart disease Mother   ? Hyperlipidemia Mother   ? Anxiety disorder Sister   ? GER disease Son   ? Asthma Son   ? GER disease Son   ? Heart disease Son   ? ?Social History  ? ?Socioeconomic History  ? Marital status: Single  ?  Spouse name: Not on file  ? Number of children: Not on file  ? Years of  education: Not on file  ? Highest education level: Not on file  ?Occupational History  ? Not on file  ?Tobacco Use  ? Smoking status: Every Day  ?  Packs/day: 0.50  ?  Years: 10.00  ?  Pack years: 5.00  ?  Types: Cigarettes  ?  Passive exposure: Never  ? Smokeless tobacco: Never  ?Vaping Use  ? Vaping Use: Never used  ?Substance and Sexual Activity  ? Alcohol use: Not Currently  ? Drug use: Never  ? Sexual activity: Not Currently  ?Other Topics Concern  ? Not on file  ?Social History Narrative  ? Not on file  ? ?Social Determinants of Health  ? ?Financial Resource Strain: Not on file  ?Food Insecurity: Not on file  ?Transportation Needs: Not on file  ?Physical Activity: Not on file  ?Stress: Not on file  ?Social Connections: Not on file  ? ? ?Review of Systems ?CONSTITUTIONAL: see HPI ?E/N/T: Negative for ear pain, nasal congestion and sore throat.  ?CARDIOVASCULAR: Negative for chest pain, dizziness, palpitations and pedal edema.  ?RESPIRATORY: Negative for recent cough and dyspnea.  ?GASTROINTESTINAL: Negative for abdominal pain, acid reflux symptoms, constipation, diarrhea, nausea and vomiting.  ?MSK: Negative for arthralgias  and myalgias.  ?INTEGUMENTARY: see HPI ?NEUROLOGICAL: Negative for dizziness and headaches.  ?PSYCHIATRIC: Negative for sleep disturbance and to question depression screen.  Negative for depression, negative for anhedonia.  ?   ? ? ?Objective:  ?BP 106/70   Pulse 72   Temp (!) 97.2 ?F (36.2 ?C)   Resp 14   Ht '5\' 5"'$  (1.651 m)   Wt 121 lb 12.8 oz (55.2 kg)   BMI 20.27 kg/m?  ? ? ?  03/06/2022  ?  8:29 AM 02/08/2022  ? 10:26 AM 01/30/2022  ? 10:54 AM  ?BP/Weight  ?Systolic BP 481 856 314  ?Diastolic BP 70 70 78  ?Wt. (Lbs) 121.8 121.4 122  ?BMI 20.27 kg/m2 20.2 kg/m2 20.3 kg/m2  ? ? ?Physical Exam ?PHYSICAL EXAM:  ? ?VS: BP 106/70   Pulse 72   Temp (!) 97.2 ?F (36.2 ?C)   Resp 14   Ht '5\' 5"'$  (1.651 m)   Wt 121 lb 12.8 oz (55.2 kg)   BMI 20.27 kg/m?  ? ?GEN: Well nourished, well  developed, in no acute distress  ?Cardiac: RRR; no murmurs, rubs, or gallops,no edema -  ?Respiratory:  normal respiratory rate and pattern with no distress - normal breath sounds with no rales, rhonchi, wheezes or rubs ?Skin: SK noted on upper back with color changes  ?Neuro:  Alert and Oriented x 3, Strength and sensation are intact - CN II-Xii grossly intact ?Psych: euthymic mood, appropriate affect and demeanor ? ?Diabetic Foot Exam - Simple   ?No data filed ?  ?  ? ?Lab Results  ?Component Value Date  ? WBC 5.9 09/04/2021  ? HGB 13.8 09/04/2021  ? HCT 41.7 09/04/2021  ? PLT 265 09/04/2021  ? GLUCOSE 86 02/08/2022  ? ALT 20 09/04/2021  ? AST 19 09/04/2021  ? NA 140 02/08/2022  ? K 4.5 02/08/2022  ? CL 105 02/08/2022  ? CREATININE 0.72 02/08/2022  ? BUN 6 (L) 02/08/2022  ? CO2 26 02/08/2022  ? TSH 2.540 09/04/2021  ? INR 1.0 11/22/2020  ? HGBA1C 5.4 01/02/2021  ? ? ? ? ?Assessment & Plan:  ? ?Problem List Items Addressed This Visit   ? ?  ? Other  ? Other fatigue - Primary  ? Relevant Orders  ? CBC with Differential/Platelet  ? Comprehensive metabolic panel  ? TSH  ? VITAMIN D 25 Hydroxy (Vit-D Deficiency, Fractures)  ? ?Other Visit Diagnoses   ? ? Gastroesophageal reflux disease without esophagitis     ?Continue current meds  ? Atypical nevus     ?Refer to dermatology  ? Fibromyalgia     ?Continue meds   ? Encounter for screening mammogram for breast cancer      ? Relevant Orders  ? MM DIGITAL SCREENING BILATERAL  ? ?  ?. ? ?No orders of the defined types were placed in this encounter. ? ? ?Orders Placed This Encounter  ?Procedures  ? MM DIGITAL SCREENING BILATERAL  ? CBC with Differential/Platelet  ? Comprehensive metabolic panel  ? TSH  ? VITAMIN D 25 Hydroxy (Vit-D Deficiency, Fractures)  ? Ambulatory referral to Dermatology  ?  ? ?Follow-up: Return in about 6 months (around 09/05/2022) for chronic fasting follow up. ? ?An After Visit Summary was printed and given to the patient. ? ?SARA R , PA-C ?[facility] ?[phone] ?

## 2022-03-06 NOTE — Addendum Note (Signed)
Addended byMarge Duncans on: 03/06/2022 01:07 PM ? ? Modules accepted: Orders ? ?

## 2022-03-07 ENCOUNTER — Other Ambulatory Visit: Payer: Self-pay | Admitting: Physician Assistant

## 2022-03-07 LAB — CBC WITH DIFFERENTIAL/PLATELET
Basophils Absolute: 0 10*3/uL (ref 0.0–0.2)
Basos: 1 %
EOS (ABSOLUTE): 0.2 10*3/uL (ref 0.0–0.4)
Eos: 3 %
Hematocrit: 40.4 % (ref 34.0–46.6)
Hemoglobin: 13.8 g/dL (ref 11.1–15.9)
Immature Grans (Abs): 0 10*3/uL (ref 0.0–0.1)
Immature Granulocytes: 0 %
Lymphocytes Absolute: 1.7 10*3/uL (ref 0.7–3.1)
Lymphs: 29 %
MCH: 30.5 pg (ref 26.6–33.0)
MCHC: 34.2 g/dL (ref 31.5–35.7)
MCV: 89 fL (ref 79–97)
Monocytes Absolute: 0.4 10*3/uL (ref 0.1–0.9)
Monocytes: 7 %
Neutrophils Absolute: 3.5 10*3/uL (ref 1.4–7.0)
Neutrophils: 60 %
Platelets: 239 10*3/uL (ref 150–450)
RBC: 4.53 x10E6/uL (ref 3.77–5.28)
RDW: 13.1 % (ref 11.7–15.4)
WBC: 5.8 10*3/uL (ref 3.4–10.8)

## 2022-03-07 LAB — COMPREHENSIVE METABOLIC PANEL
ALT: 15 IU/L (ref 0–32)
AST: 19 IU/L (ref 0–40)
Albumin/Globulin Ratio: 2 (ref 1.2–2.2)
Albumin: 4.3 g/dL (ref 3.8–4.8)
Alkaline Phosphatase: 66 IU/L (ref 44–121)
BUN/Creatinine Ratio: 12 (ref 9–23)
BUN: 9 mg/dL (ref 6–24)
Bilirubin Total: 0.3 mg/dL (ref 0.0–1.2)
CO2: 23 mmol/L (ref 20–29)
Calcium: 9.9 mg/dL (ref 8.7–10.2)
Chloride: 105 mmol/L (ref 96–106)
Creatinine, Ser: 0.76 mg/dL (ref 0.57–1.00)
Globulin, Total: 2.2 g/dL (ref 1.5–4.5)
Glucose: 96 mg/dL (ref 70–99)
Potassium: 5 mmol/L (ref 3.5–5.2)
Sodium: 139 mmol/L (ref 134–144)
Total Protein: 6.5 g/dL (ref 6.0–8.5)
eGFR: 99 mL/min/{1.73_m2} (ref >59)

## 2022-03-07 LAB — VITAMIN D 25 HYDROXY (VIT D DEFICIENCY, FRACTURES): Vit D, 25-Hydroxy: 12.9 ng/mL — ABNORMAL LOW (ref 30.0–100.0)

## 2022-03-07 LAB — TSH: TSH: 1.72 u[IU]/mL (ref 0.450–4.500)

## 2022-03-07 MED ORDER — VITAMIN D (ERGOCALCIFEROL) 1.25 MG (50000 UNIT) PO CAPS: 50000.0000 [IU] | ORAL_CAPSULE | ORAL | 5 refills | Status: DC

## 2022-03-16 ENCOUNTER — Other Ambulatory Visit: Payer: Self-pay | Admitting: Internal Medicine

## 2022-03-16 DIAGNOSIS — M797 Fibromyalgia: Secondary | ICD-10-CM

## 2022-04-10 LAB — HM MAMMOGRAPHY

## 2022-04-14 ENCOUNTER — Other Ambulatory Visit: Payer: Self-pay | Admitting: Internal Medicine

## 2022-04-14 DIAGNOSIS — M797 Fibromyalgia: Secondary | ICD-10-CM

## 2022-04-24 ENCOUNTER — Encounter: Payer: Self-pay | Admitting: Physician Assistant

## 2022-04-24 ENCOUNTER — Encounter: Payer: Self-pay | Admitting: Internal Medicine

## 2022-04-25 NOTE — Telephone Encounter (Signed)
Weakness usually indicates more severe problem than numbness. Unfortunately there is no brace that would generally help symptoms without restricting mobility in the wrist. It sounds like symptoms are getting worse I would consider trying to treat this with local steroid injection if she is interested.

## 2022-04-25 NOTE — Telephone Encounter (Signed)
Vit D def would not be causing this --- more likely her fibromyalgia - she is seeing specialist for those issues

## 2022-04-25 NOTE — Telephone Encounter (Signed)
Can we schedule her for an appointment right wrist carpal tunnel US guided injection? I don't have anything specific to recommend for the memory or concentration issue right now.

## 2022-04-26 NOTE — Telephone Encounter (Signed)
Spoke with patient and scheduled an appointment for a right wrist carpal tunnel US guided injection on 04/27/2022 at 11:00 am. Patient advised she is a work in appointment and my encounter a weight. Patient advised Dr. Benjamine Mola doesn't have anything specific to recommend for the memory or concentration issue right now. Patient expressed understanding.

## 2022-04-27 ENCOUNTER — Ambulatory Visit (INDEPENDENT_AMBULATORY_CARE_PROVIDER_SITE_OTHER): Payer: BC Managed Care – PPO | Admitting: Internal Medicine

## 2022-04-27 ENCOUNTER — Ambulatory Visit: Payer: Self-pay

## 2022-04-27 DIAGNOSIS — G5603 Carpal tunnel syndrome, bilateral upper limbs: Secondary | ICD-10-CM | POA: Diagnosis not present

## 2022-04-27 NOTE — Progress Notes (Signed)
   Procedure Note  Patient: Sarah [name]             Date of Birth: Oct 04, 1976           MRN: [id number]             Visit Date: 04/27/2022  Procedures: Visit Diagnoses:  1. Carpal tunnel syndrome, bilateral     Hand/UE Inj: R carpal tunnel for carpal tunnel syndrome on 04/27/2022 11:40 AM Indications: tendon swelling Details: 25 G needle, ultrasound-guided ulnar approach Medications: 0.75 mL lidocaine 1 %; 30 mg triamcinolone acetonide 40 MG/ML Outcome: tolerated well, no immediate complications    Consent was given by the patient. Immediately prior to procedure a time out was called to verify the correct patient, procedure, equipment, support staff and site/side marked as required. Patient was prepped and draped in the usual sterile fashion.

## 2022-05-03 MED ORDER — LIDOCAINE HCL 1 % IJ SOLN
0.7500 mL | INTRAMUSCULAR | Status: AC | PRN
  Administered 2022-04-27: .75 mL

## 2022-05-03 MED ORDER — TRIAMCINOLONE ACETONIDE 40 MG/ML IJ SUSP
30.0000 mg | INTRAMUSCULAR | Status: AC | PRN
  Administered 2022-04-27: 30 mg

## 2022-05-17 LAB — HM PAP SMEAR: HM Pap smear: NEGATIVE

## 2022-06-21 ENCOUNTER — Other Ambulatory Visit: Payer: Self-pay | Admitting: Internal Medicine

## 2022-06-21 DIAGNOSIS — M797 Fibromyalgia: Secondary | ICD-10-CM

## 2022-07-28 ENCOUNTER — Other Ambulatory Visit: Payer: Self-pay | Admitting: Internal Medicine

## 2022-07-28 DIAGNOSIS — M797 Fibromyalgia: Secondary | ICD-10-CM

## 2022-07-31 NOTE — Telephone Encounter (Signed)
Next Visit: 08/13/2022  Last Visit: 02/08/2022  Last Fill: 04/15/2022  Dx: Fibromyalgia syndrome   Current Dose per office note on 02/08/2022: 60 mg duloxetine  Okay to refill Cymbalta?

## 2022-08-02 NOTE — Progress Notes (Signed)
Office Visit Note  Patient: Sarah [name]             Date of Birth: 04-13-1976           MRN: [id number]             PCP: Marge Duncans, PA-C Referring: Marge Duncans, PA-C Visit Date: 08/13/2022   Subjective:  Follow-up (Left elbow skin discoloration. Shaky hands. Bilateral knee joint pain, elbow pain, and neck pain. Pain down right side of body.)   History of Present Illness: Sarah [name] is a 46 y.o. female here for follow up with joint pains and myofascial pain on cymbalta 60 mg daily and celebrex 200 mg daily or as needed. 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 She notices some shakiness or tremor in her hands that might be increased. Neck pain and muscle soreness. She has some elbow pains and there is a rash on her left elbow just in the past week or two. She also is having pain around her right hip and radiating into the right leg sometimes often after standing from driving for 61-60 minutes.  Previous HPI 02/08/2022  SYANNA [name] is a 45 y.o. female here for follow up with joint pains and myofascial pain after starting cymbalta and titrate to 60 mg and with celebrex 200 mg BID. She noticed an improvement in pain of both legs after increasing the cymbalta dose to 60 mg. She has intermittent hand numbness continuing to come and go in both hands, bothering her a bit more than before. She reports one incident of using too hot water for her son with inability to feel temperature.   Previous HPI 11/09/21 Sarah [name] is a 46 y.o. female here for follow up with multiple symptoms including joint pains, fatigue, numbness, and unintentional weight loss with positive ANA serology. Labs at initial visit showing positive RNP 1.5 otherwise normal CK norma complements low ANA titer 1:40. She continues having ongoing symptoms with pain all over worst in legs at the moment and  very fatigued.   Previous HPI 10/12/21 Sarah [name] is a 46 y.o. female here for joint pains, numbness, fatigue, and weight loss with positive ANA testing.  Symptoms have really been ongoing since last year no specific onset or proceeding infections or medical events that she can recall.  Initially this was accompanied by a generalized body aches and fatigue as well as a progressive unexplained weight loss from her baseline down to as low as 105 pounds.  She has had some amount of chronic joint pain previous evaluations of bilateral rotator cuff arthropathy, mild degenerative disc disease in the lumbar spine, previous right leg fracture with residual swelling intermittently at the ankle and knee.  However these were increased additionally with problems of a lot of stiffness and pain in the morning and getting up from stationary positions particularly in bilateral hips. Particularly notices pain whenever she has to drive for an hour or longer at a time.  These have improved a lot on Celebrex but she feels symptoms quickly worsen again if off the medication. More recently she is also started having muscle cramping particularly in her legs not associated with any particular position or activity.  She also reports numbness and tingling sensation particularly in bilateral hands this occurs overnight and first thing in the morning as well as periodically throughout the  day.  She had nerve conduction study for this reportedly showing minimal carpal tunnel syndrome not felt to adequately explain symptoms.  She tried wearing wrist braces for this with no symptom improvement.  Throughout that time she denied any loss of appetite only gastrointestinal symptom was increase in loose frequent stools not associated with any pain or bleeding.  She described frequent nighttime awakening often every 1-2 hours without specific causes and has severe fatigue and daytime somnolence able to fall asleep unintentionally whenever she  stops moving. She denies new hair loss, oral ulcers, raynaud's symptoms, lymphadenopathy, or history of blood clots. She reports dry mouth and dry skin symptoms but no problems with her eyes. 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 She has headaches usually bilateral lasting for up to a few hours at a time. She has urinary urgency with occasional urge incontinence. She does not report allodynia symptoms.   LAbs reviewed 08/2021 ANA pos RF neg CCP neg ESR 3 CRP <1 TSH 2.54 CBC wnl CMP Ca 10.4   Imaging reviewed 04/21/21 MR Cardiac stress test IMPRESSION: 1.  Normal stress perfusion 2.  No evidence of cardiac amyloidosis 3.  Normal LV size, wall thickness, and systolic function (EF 53%) 4.  Normal RV size and systolic function (EF 66%) 5.  No late gadolinium enhancement to suggest myocardial scar     Review of Systems  Constitutional:  Positive for fatigue.  HENT:  Positive for mouth dryness. Negative for mouth sores.   Eyes:  Positive for dryness.  Respiratory:  Positive for shortness of breath.   Cardiovascular:  Positive for chest pain. Negative for palpitations.  Gastrointestinal:  Positive for blood in stool and diarrhea. Negative for constipation.  Endocrine: Negative for increased urination.  Genitourinary:  Negative for involuntary urination.  Musculoskeletal:  Positive for joint pain, gait problem, joint pain, myalgias, morning stiffness and myalgias. Negative for joint swelling, muscle weakness and muscle tenderness.  Skin:  Positive for rash and hair loss. Negative for color change and sensitivity to sunlight.  Allergic/Immunologic: Positive for susceptible to infections.  Neurological:  Positive for dizziness and headaches.  Hematological:  Negative for swollen glands.  Psychiatric/Behavioral:  Positive for sleep disturbance. Negative for depressed mood. The patient is not nervous/anxious.     PMFS  History:  Patient Active Problem List   Diagnosis Date Noted   Vitamin D deficiency 08/13/2022   Pain in right hip 08/13/2022   Facial rash 02/08/2022   Fibromyalgia syndrome 11/09/2021   Positive ANA (antinuclear antibody) 10/12/2021   Impingement syndrome of right shoulder 07/07/2021   Labral tear of shoulder, degenerative, right 07/07/2021   Carpal tunnel syndrome, bilateral 07/07/2021   DDD (degenerative disc disease), cervical 07/07/2021   Other fatigue 05/08/2021   Leg cramps 05/08/2021   Frequency of urination 05/08/2021   Irregular menses 05/08/2021   Scoliosis    Palpitations 01/26/2021   Left breast mass 01/26/2021   Polyuria 01/06/2021   Hypercalcemia 01/06/2021   Malaise 01/06/2021   Need for tetanus, diphtheria, and acellular pertussis (Tdap) vaccine 01/06/2021   Weight loss 01/02/2021   Hyperglycemia 01/02/2021    Past Medical History:  Diagnosis Date   Carpal tunnel syndrome    Hernia, hiatal    Hypercalcemia 01/06/2021   Left breast mass 01/26/2021   Liver cyst    Malaise 01/06/2021   Need for tetanus, diphtheria, and acellular pertussis (Tdap) vaccine 01/06/2021   Palpitations 01/26/2021  Polyuria 01/06/2021   Scoliosis    Weight loss 01/02/2021    Family History  Problem Relation Age of Onset   Heart disease Mother    Hyperlipidemia Mother    Anxiety disorder Sister    GER disease Son    Asthma Son    GER disease Son    Heart disease Son    Past Surgical History:  Procedure Laterality Date   CERVICAL SPINE SURGERY     Left fooscrew and plate x2t      TUBAL LIGATION     Social History   Social History Narrative   Not on file   Immunization History  Administered Date(s) Administered   PFIZER(Purple Top)SARS-COV-2 Vaccination 06/28/2020, 07/19/2020   Tdap 01/06/2021     Objective: Vital Signs: BP 96/66 (BP Location: Right Arm, Patient Position: Sitting, Cuff Size: Normal)   Pulse 89   Resp 14   Ht _0  (1.651 m)   Wt 124 lb 3.2  oz (56.3 kg)   LMP 07/30/2022   BMI 20.67 kg/m    Physical Exam Cardiovascular:     Rate and Rhythm: Normal rate and regular rhythm.  Pulmonary:     Effort: Pulmonary effort is normal.     Breath sounds: Normal breath sounds.  Musculoskeletal:     Right lower leg: No edema.     Left lower leg: No edema.  Lymphadenopathy:     Cervical: No cervical adenopathy.  Skin:    General: Skin is warm and dry.     Findings: No rash.  Neurological:     Mental Status: She is alert.  Psychiatric:        Mood and Affect: Mood normal.      Musculoskeletal Exam:  Neck full ROM no tenderness Shoulders full ROM no tenderness or swelling Elbows full ROM no tenderness or swelling Wrists full ROM no tenderness or swelling Fingers full ROM no tenderness or swelling Mild right hip lateral tenderness to pressure, some pain with rotation ROM Knees full ROM no tenderness or swelling, mild crepitus in right knee Ankles full ROM no tenderness or swelling   Investigation: No additional findings.  Imaging: No results found.  Recent Labs: Lab Results  Component Value Date   WBC 5.8 03/06/2022   HGB 13.8 03/06/2022   PLT 239 03/06/2022   NA 139 03/06/2022   K 5.0 03/06/2022   CL 105 03/06/2022   CO2 23 03/06/2022   GLUCOSE 96 03/06/2022   BUN 9 03/06/2022   CREATININE 0.76 03/06/2022   BILITOT 0.3 03/06/2022   ALKPHOS 66 03/06/2022   AST 19 03/06/2022   ALT 15 03/06/2022   PROT 6.5 03/06/2022   ALBUMIN 4.3 03/06/2022   CALCIUM 9.9 03/06/2022   GFRAA 115 01/06/2021    Speciality Comments: No specialty comments available.  Procedures:  No procedures performed Allergies: Meloxicam   Assessment / Plan:     Visit Diagnoses: Fibromyalgia syndrome - Plan: BASIC METABOLIC PANEL WITH GFR  Appears pretty stable getting good benefit with the Cymbalta and Celebrex.  Recheck basic metabolic panel for monitoring renal function with long-term NSAID use.  Plan to continue Cymbalta 60 mg p.o.  daily.  Carpal tunnel syndrome, bilateral  Symptoms are still improved since the injections in June but she is noticing some return of the tingling sensation in her fingertips.  Can continue the stretching exercises would not repeat any local therapy unless much further out.  Positive ANA (antinuclear antibody) - Plan: RNP Antibody  Persistent  low positive RNP antibody titer has decreased on previous testing we will recheck this again today at 69-monthfollow-up.  Rashes on the left elbow do not look infectious or inflammatory question atopic or contact dermatitis problem.  Recommend trial of topical hydrocortisone as a for step.  Facial rash  Not in any current exacerbation.  Vitamin D deficiency - Plan: VITAMIN D 25 Hydroxy (Vit-D Deficiency, Fractures)  She has been on weekly high-dose supplementation now for the exact about 5 months we will recheck vitamin D level if it is replete at this time recommend switching to daily supplementation.  Pain in right hip  Appears consistent with a lateral hip pain syndrome there is no decreased range of motion in the hip joint.  Provided initial range of motion exercises recommended adding these as a first step.  Could consider physical therapy if failing to improve.  Do not recommend muscle relaxant treatment for symptoms associate with driving.   Orders: Orders Placed This Encounter  Procedures   RNP Antibody   BASIC METABOLIC PANEL WITH GFR   VITAMIN D 25 Hydroxy (Vit-D Deficiency, Fractures)   No orders of the defined types were placed in this encounter.    Follow-Up Instructions: Return in about 6 months (around 02/11/2023) for FMS/OA/CTS f/u 630mo   ChCollier SalinaMD  Note - This record has been created using [employer] Chart creation errors have been sought, but may not always  have been located. Such creation errors do not reflect on  the standard of medical care.

## 2022-08-10 ENCOUNTER — Other Ambulatory Visit: Payer: Self-pay | Admitting: Physician Assistant

## 2022-08-13 ENCOUNTER — Ambulatory Visit: Payer: BC Managed Care – PPO | Attending: Internal Medicine | Admitting: Internal Medicine

## 2022-08-13 ENCOUNTER — Encounter: Payer: Self-pay | Admitting: Internal Medicine

## 2022-08-13 VITALS — BP 96/66 | HR 89 | Resp 14 | Ht 65.0 in | Wt 124.2 lb

## 2022-08-13 DIAGNOSIS — M25551 Pain in right hip: Secondary | ICD-10-CM

## 2022-08-13 DIAGNOSIS — R768 Other specified abnormal immunological findings in serum: Secondary | ICD-10-CM

## 2022-08-13 DIAGNOSIS — R21 Rash and other nonspecific skin eruption: Secondary | ICD-10-CM | POA: Diagnosis not present

## 2022-08-13 DIAGNOSIS — G5603 Carpal tunnel syndrome, bilateral upper limbs: Secondary | ICD-10-CM

## 2022-08-13 DIAGNOSIS — M797 Fibromyalgia: Secondary | ICD-10-CM

## 2022-08-13 DIAGNOSIS — E559 Vitamin D deficiency, unspecified: Secondary | ICD-10-CM

## 2022-08-13 DIAGNOSIS — R7689 Other specified abnormal immunological findings in serum: Secondary | ICD-10-CM

## 2022-08-13 NOTE — Patient Instructions (Signed)
I am rechecking the positive RNP antibody test today. Also the vitamin D level.  I recommend trying topical hydrocortisone on the affected skin can use twice daily until symptoms improve. If getting worse instead let us know we can try additional options.  Hip and leg pain appears to be coming from muscle or tendons so can treat this conservatively for now or could refer to physical therapy if not improving.

## 2022-08-14 LAB — BASIC METABOLIC PANEL WITH GFR
BUN: 8 mg/dL (ref 7–25)
CO2: 29 mmol/L (ref 20–32)
Calcium: 10.3 mg/dL — ABNORMAL HIGH (ref 8.6–10.2)
Chloride: 104 mmol/L (ref 98–110)
Creat: 0.67 mg/dL (ref 0.50–0.99)
Glucose, Bld: 92 mg/dL (ref 65–99)
Potassium: 3.7 mmol/L (ref 3.5–5.3)
Sodium: 140 mmol/L (ref 135–146)
eGFR: 110 mL/min/{1.73_m2} (ref >=60)

## 2022-08-14 LAB — RNP ANTIBODY: Ribonucleic Protein(ENA) Antibody, IgG: 1.2 AI — AB

## 2022-08-14 LAB — VITAMIN D 25 HYDROXY (VIT D DEFICIENCY, FRACTURES): Vit D, 25-Hydroxy: 85 ng/mL (ref 30–100)

## 2022-08-15 IMAGING — MR MR CARDIAC FOR FUNCTION WO/W CM
44 of 48 series · 44 of 48 positions shown · non-contrast
Comparison: none

CLINICAL DATA: Stress CATATA to evaluate for ischemia

[Series 4: [id number]_bh · axial · 8.0mm · 1.41mm/px · 1 of 16 slices shown]
[im 1/16]
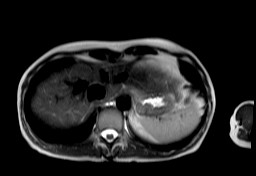

[Series 8: bSSFP · oblique · 6.0mm · 1.41mm/px · 1 of 25 slices shown (1 of 20)]
[im 1/25]
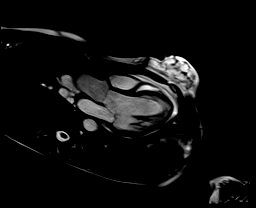

[Series 9: bSSFP · oblique · 6.0mm · 1.41mm/px · 1 of 25 slices shown (2 of 20)]
[im 1/25]
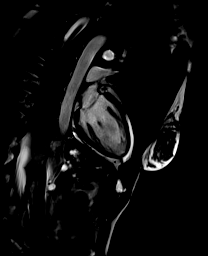

[Series 10: bSSFP · oblique · 6.0mm · 1.41mm/px · 1 of 25 slices shown (3 of 20)]
[im 1/25]
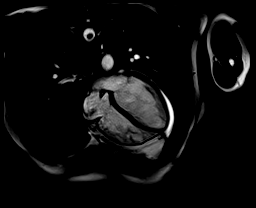

[Series 11: (id)_long_t1 · oblique · 8.0mm · 1.56mm/px · 1 of 24 slices shown]
[im 1/24]
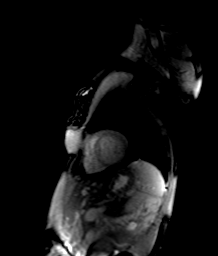

[Series 12: (id)_long_t1_moco · oblique · 8.0mm · 1.56mm/px · 1 of 24 slices shown]
[im 1/24]
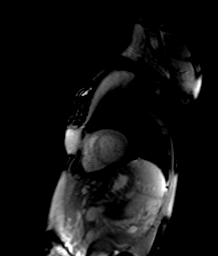

[Series 15: (id)_trufi · oblique · 8.0mm · 2.08mm/px · 1 of 9 slices shown]
[im 1/9]
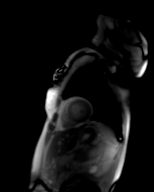

[Series 16: (id)_trufi_moco · oblique · 8.0mm · 2.08mm/px · 1 of 9 slices shown]
[im 1/9]
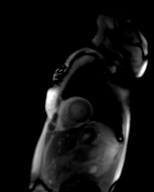

[Series 19: pre short axis · oblique · non-contrast · 8.0mm · 2.25mm/px · 1 of 10 slices shown (1 of 6)]
[im 1/10]
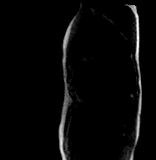

[Series 20: pre short axis · oblique · non-contrast · 8.0mm · 2.25mm/px · 1 of 10 slices shown (2 of 6)]
[im 1/10]
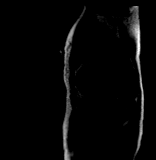

[Series 21: pre short axis · oblique · non-contrast · 8.0mm · 2.25mm/px · 1 of 10 slices shown (3 of 6)]
[im 1/10]
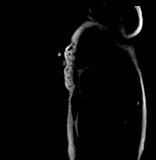

[Series 22: pre short axis · oblique · non-contrast · 8.0mm · 2.25mm/px · 1 of 10 slices shown (4 of 6)]
[im 1/10]
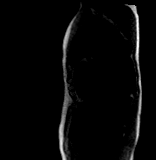

[Series 23: pre short axis · oblique · non-contrast · 8.0mm · 2.25mm/px · 1 of 10 slices shown (5 of 6)]
[im 1/10]
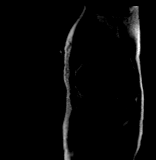

[Series 24: pre short axis · oblique · non-contrast · 8.0mm · 2.25mm/px · 1 of 10 slices shown (6 of 6)]
[im 1/10]
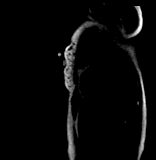

[Series 25: stress short axis · oblique · 8.0mm · 2.25mm/px · 1 of 80 slices shown (1 of 6)]
[im 1/80]
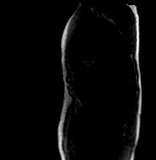

[Series 26: stress short axis · oblique · 8.0mm · 2.25mm/px · 1 of 80 slices shown (2 of 6)]
[im 1/80]
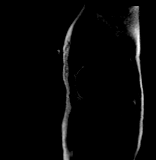

[Series 27: stress short axis · oblique · 8.0mm · 2.25mm/px · 1 of 80 slices shown (3 of 6)]
[im 1/80]
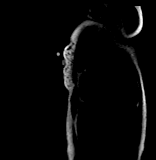

[Series 28: stress short axis · oblique · 8.0mm · 2.25mm/px · 1 of 80 slices shown (4 of 6)]
[im 1/80]
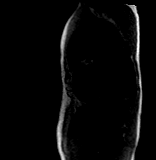

[Series 29: stress short axis · oblique · 8.0mm · 2.25mm/px · 1 of 80 slices shown (5 of 6)]
[im 1/80]
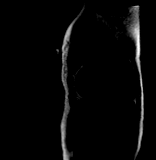

[Series 30: stress short axis · oblique · 8.0mm · 2.25mm/px · 1 of 80 slices shown (6 of 6)]
[im 1/80]
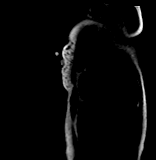

[Series 31: bSSFP · oblique · 8.0mm · 1.61mm/px · 1 of 25 slices shown (4 of 20)]
[im 1/25]
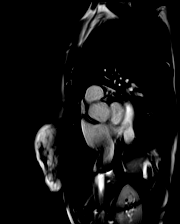

[Series 32: bSSFP · oblique · 8.0mm · 1.61mm/px · 1 of 25 slices shown (5 of 20)]
[im 1/25]
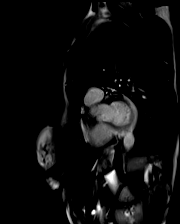

[Series 33: bSSFP · oblique · 8.0mm · 1.61mm/px · 1 of 25 slices shown (6 of 20)]
[im 1/25]
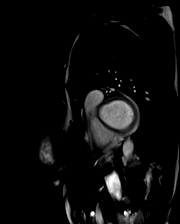

[Series 34: bSSFP · oblique · 8.0mm · 1.61mm/px · 1 of 25 slices shown (7 of 20)]
[im 1/25]
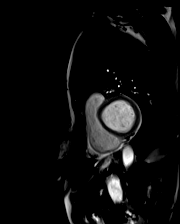

[Series 35: bSSFP · oblique · 8.0mm · 1.61mm/px · 1 of 25 slices shown (8 of 20)]
[im 1/25]
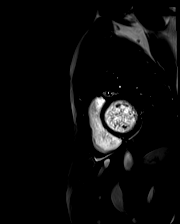

[Series 36: bSSFP · oblique · 8.0mm · 1.61mm/px · 1 of 25 slices shown (9 of 20)]
[im 1/25]
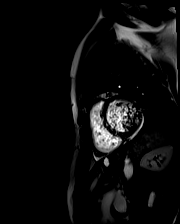

[Series 38: bSSFP · oblique · 8.0mm · 1.61mm/px · 1 of 25 slices shown (10 of 20)]
[im 1/25]
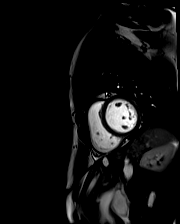

[Series 39: bSSFP · oblique · 8.0mm · 1.61mm/px · 1 of 25 slices shown (11 of 20)]
[im 1/25]
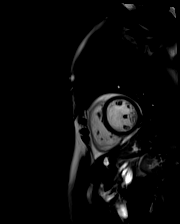

[Series 40: bSSFP · oblique · 8.0mm · 1.61mm/px · 1 of 25 slices shown (12 of 20)]
[im 1/25]
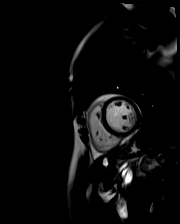

[Series 41: bSSFP · oblique · 8.0mm · 1.61mm/px · 1 of 25 slices shown (13 of 20)]
[im 1/25]
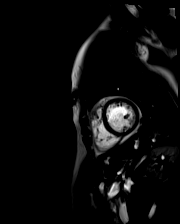

[Series 42: bSSFP · oblique · 8.0mm · 1.61mm/px · 1 of 25 slices shown (14 of 20)]
[im 1/25]
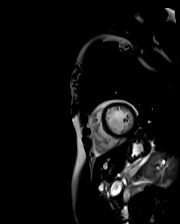

[Series 43: bSSFP · oblique · 8.0mm · 1.61mm/px · 1 of 25 slices shown (15 of 20)]
[im 1/25]
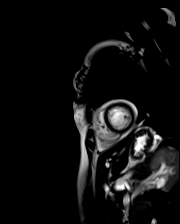

[Series 44: bSSFP · oblique · 8.0mm · 1.61mm/px · 1 of 25 slices shown (16 of 20)]
[im 1/25]
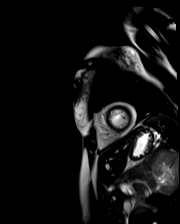

[Series 45: bSSFP · oblique · 8.0mm · 1.61mm/px · 1 of 25 slices shown (17 of 20)]
[im 1/25]
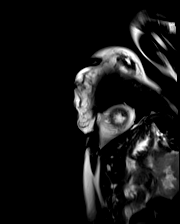

[Series 46: bSSFP · oblique · 8.0mm · 1.61mm/px · 1 of 25 slices shown (18 of 20)]
[im 1/25]
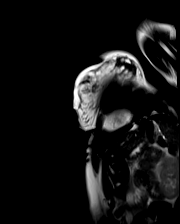

[Series 47: bSSFP · oblique · 8.0mm · 1.61mm/px · 1 of 25 slices shown (19 of 20)]
[im 1/25]
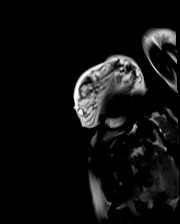

[Series 48: rest short axis · oblique · 8.0mm · 2.25mm/px · 1 of 60 slices shown (1 of 6)]
[im 1/60]
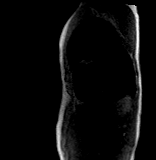

[Series 49: rest short axis · oblique · 8.0mm · 2.25mm/px · 1 of 60 slices shown (2 of 6)]
[im 1/60]
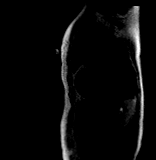

[Series 50: rest short axis · oblique · 8.0mm · 2.25mm/px · 1 of 60 slices shown (3 of 6)]
[im 1/60]
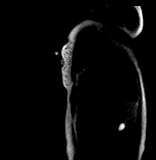

[Series 51: rest short axis · oblique · 8.0mm · 2.25mm/px · 1 of 60 slices shown (4 of 6)]
[im 1/60]
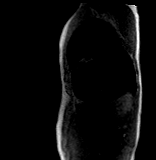

[Series 52: rest short axis · oblique · 8.0mm · 2.25mm/px · 1 of 60 slices shown (5 of 6)]
[im 1/60]
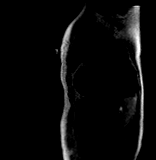

[Series 53: rest short axis · oblique · 8.0mm · 2.25mm/px · 1 of 60 slices shown (6 of 6)]
[im 1/60]
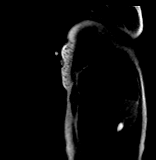

[Series 54: bSSFP · coronal · 6.0mm · 1.41mm/px · 1 of 25 slices shown (20 of 20)]
[im 1/25]
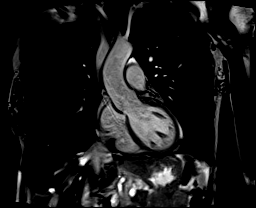

[Series 55: aortic valve cine · oblique · 6.0mm · 1.41mm/px · 1 of 25 slices shown]
[im 1/25]
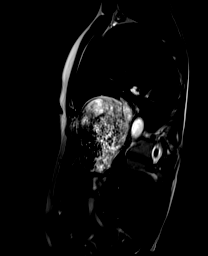

[44 of 48 positions shown; findings below may reference images not displayed]

EXAM:
CARDIAC MRI

PROCEDURE:
PROCEDURE
The patient was scanned on a 1.5 Tesla Siemens magnet. A dedicated
cardiac coil was used. Functional imaging was done using [employer]
sequences. [DATE], and 4 chamber views were done to assess for RWMA's.
Modified Jumper rule using a short axis stack was used to
calculate an ejection fraction on a dedicated work station using

Circ[employer] software. Rest and stress (following administration of
regadenoson 0.4mg) first-pass contrast-enhanced imaging was done.
The patient received 8 cc of Gadavist. After 10 minutes inversion
recovery sequences were used to assess for infiltration and scar
tissue.

CONTRAST:  8 cc  of Gadavist
FINDINGS: Left ventricle:

-Normal wall thickness

-Normal size

-Normal systolic function

-Normal stress perfusion

-Nonspecific ECV elevation (31%), not in amyloid range (<40%)

-No LGE

LV EF:  62% (Normal 56-78%)

Absolute volumes:

LV EDV: 137mL (Normal 52-141 mL)

LV ESV: 52mL (Normal 13-51 mL)

LV SV: 85mL (Normal 33-97 mL)

CO: 7.1L/min (Normal 2.7-6.0 L/min)

Indexed volumes:

LV EDV: 87mL/sq-m (Normal 41-81 mL/sq-m)

LV ESV: 33mL/sq-m (Normal 12-21 mL/sq-m)

LV SV: 54mL/sq-m (Normal 26-56 mL/sq-m)

CI: 4.5L/min/sq-m (Normal 1.8-3.8 L/min/sq-m)

Right ventricle: Normal size and systolic function

RV EF: 69% (Normal 47-80%)

Absolute volumes:

RV EDV: 118mL (Normal 58-154 mL)

RV ESV: 37mL (Normal 12-68 mL)

RV SV: 81mL (Normal 35-98 mL)

CO: 6.8L/min (Normal 2.7-6 L/min)

Indexed volumes:

RV EDV: 74mL/sq-m (Normal 48-87 mL/sq-m)

RV ESV: 23mL/sq-m (Normal 11-28 mL/sq-m)

RV SV: 51mL/sq-m (Normal 27-57 mL/sq-m)

CI: 4.3L/min/sq-m (Normal 1.8-3.8 L/min/sq-m)

Left atrium: Normal size

Right atrium: Mild enlargement

Mitral valve: No regurgitation

Aortic valve: No regurgitation

Tricuspid valve: Trivial regurgitation

Pulmonic valve: Trivial regurgitation

Aorta: Normal proximal ascending aorta

Pericardium: Trivial effusion
IMPRESSION: 1.  Normal stress perfusion

2.  No evidence of cardiac amyloidosis

3.  Normal LV size, wall thickness, and systolic function (EF 62%)

4.  Normal RV size and systolic function (EF 69%)

5.  No late gadolinium enhancement to suggest myocardial scar

## 2022-08-15 NOTE — Progress Notes (Signed)
Her antibody test remains mildly positive the same level as 6 months ago.  Her vitamin D level is up to 85 this is pretty high. Goal is to be above 30. I think she can stop taking the weekly high dose supplement and just take a daily vitamin D. OTC supplement is fine, she should aim to take 2000-4000 units per day.

## 2022-09-10 ENCOUNTER — Ambulatory Visit: Payer: BC Managed Care – PPO | Admitting: Physician Assistant

## 2022-09-10 ENCOUNTER — Encounter: Payer: Self-pay | Admitting: Physician Assistant

## 2022-09-10 VITALS — BP 98/70 | HR 78 | Temp 98.7°F | Resp 14 | Ht 65.0 in | Wt 124.0 lb

## 2022-09-10 DIAGNOSIS — M797 Fibromyalgia: Secondary | ICD-10-CM

## 2022-09-10 DIAGNOSIS — Z23 Encounter for immunization: Secondary | ICD-10-CM | POA: Diagnosis not present

## 2022-09-10 DIAGNOSIS — R5383 Other fatigue: Secondary | ICD-10-CM | POA: Diagnosis not present

## 2022-09-10 DIAGNOSIS — K219 Gastro-esophageal reflux disease without esophagitis: Secondary | ICD-10-CM | POA: Diagnosis not present

## 2022-09-10 DIAGNOSIS — Z1211 Encounter for screening for malignant neoplasm of colon: Secondary | ICD-10-CM | POA: Diagnosis not present

## 2022-09-10 NOTE — Progress Notes (Signed)
Subjective:  Patient ID: Sarah [name], female    DOB: 08-Jun-1976  Age: 46 y.o. MRN: [id number]  Chief Complaint  Patient presents with   Gastroesophageal Reflux   Fibromyalgia    HPI  Pt in for follow up of fibromyalgia - she states she is doing well on cymbalta '60mg'$  daily beut recently has been having more fatigue than usual She also uses celebrex '200mg'$  qd  Pt with history of vit D def and has been on weekly supplements - Dr Benjamine Mola has recently changed her over to taking daily supplement  Pt with history of GERD and hiatal hernia - stable on pepcid 40  She is due for colonoscopy - will like to go ahead and schedule  Pt would like flu shot  Current Outpatient Medications on File Prior to Visit  Medication Sig Dispense Refill   celecoxib (CELEBREX) 200 MG capsule TAKE 1 CAPSULE BY MOUTH TWICE A DAY 30 capsule 2   DULoxetine (CYMBALTA) 60 MG capsule TAKE 1 CAPSULE BY MOUTH EVERY DAY 90 capsule 0   famotidine (PEPCID) 40 MG tablet Take 40 mg by mouth 2 (two) times daily.     No current facility-administered medications on file prior to visit.   Past Medical History:  Diagnosis Date   Carpal tunnel syndrome    Hernia, hiatal    Hypercalcemia 01/06/2021   Left breast mass 01/26/2021   Liver cyst    Malaise 01/06/2021   Need for tetanus, diphtheria, and acellular pertussis (Tdap) vaccine 01/06/2021   Palpitations 01/26/2021   Polyuria 01/06/2021   Scoliosis    Weight loss 01/02/2021   Past Surgical History:  Procedure Laterality Date   CERVICAL SPINE SURGERY     Left fooscrew and plate x2t      TUBAL LIGATION      Family History  Problem Relation Age of Onset   Heart disease Mother    Hyperlipidemia Mother    Anxiety disorder Sister    GER disease Son    Asthma Son    GER disease Son    Heart disease Son    Social History   Socioeconomic History   Marital status: Single    Spouse name: Not on file   Number of children: Not on file   Years of education: Not  on file   Highest education level: Not on file  Occupational History   Not on file  Tobacco Use   Smoking status: Every Day    Packs/day: 0.50    Years: 10.00    Total pack years: 5.00    Types: Cigarettes    Passive exposure: Never   Smokeless tobacco: Never  Vaping Use   Vaping Use: Never used  Substance and Sexual Activity   Alcohol use: Not Currently   Drug use: Never   Sexual activity: Not Currently  Other Topics Concern   Not on file  Social History Narrative   Not on file   Social Determinants of Health   Financial Resource Strain: Not on file  Food Insecurity: Not on file  Transportation Needs: Not on file  Physical Activity: Not on file  Stress: Not on file  Social Connections: Not on file    Review of Systems CONSTITUTIONAL: see HPI E/N/T: Negative for ear pain, nasal congestion and sore throat.  CARDIOVASCULAR: Negative for chest pain, dizziness, palpitations and pedal edema.  RESPIRATORY: Negative for recent cough and dyspnea.  GASTROINTESTINAL: see HPI MSK: Negative for arthralgias and myalgias.  INTEGUMENTARY: Negative for  rash.  NEUROLOGICAL: Negative for dizziness and headaches.  PSYCHIATRIC: Negative for sleep disturbance and to question depression screen.  Negative for depression, negative for anhedonia.       Objective:  PHYSICAL EXAM:   VS: BP 98/70   Pulse 78   Temp 98.7 F (37.1 C)   Resp 14   Ht '5\' 5"'$  (1.651 m)   Wt 124 lb (56.2 kg)   LMP 07/30/2022   SpO2 98%   BMI 20.63 kg/m   GEN: Well nourished, well developed, in no acute distress   Cardiac: RRR; no murmurs, rubs, or gallops,no edema -  Respiratory:  normal respiratory rate and pattern with no distress - normal breath sounds with no rales, rhonchi, wheezes or rubs  MS: no deformity or atrophy  Skin: warm and dry, no rash   Psych: euthymic mood, appropriate affect and demeanor   Diabetic Foot Exam - Simple   No data filed      Lab Results  Component Value Date    WBC 5.8 03/06/2022   HGB 13.8 03/06/2022   HCT 40.4 03/06/2022   PLT 239 03/06/2022   GLUCOSE 92 08/13/2022   ALT 15 03/06/2022   AST 19 03/06/2022   NA 140 08/13/2022   K 3.7 08/13/2022   CL 104 08/13/2022   CREATININE 0.67 08/13/2022   BUN 8 08/13/2022   CO2 29 08/13/2022   TSH 1.720 03/06/2022   INR 1.0 11/22/2020   HGBA1C 5.4 01/02/2021      Assessment & Plan:   Problem List Items Addressed This Visit       Digestive   Gastroesophageal reflux disease without esophagitis Continue meds     Other   Other fatigue   Relevant Orders   CBC with Differential/Platelet   Comprehensive metabolic panel   TSH   Fibromyalgia - Primary   Screening for colon cancer   Relevant Orders   Ambulatory referral to Gastroenterology   Needs flu shot   Relevant Orders   Flu Vaccine QUAD 6+ mos PF IM (Fluarix Quad PF) (Completed)  .  No orders of the defined types were placed in this encounter.   Orders Placed This Encounter  Procedures   Flu Vaccine QUAD 6+ mos PF IM (Fluarix Quad PF)   CBC with Differential/Platelet   Comprehensive metabolic panel   TSH   Ambulatory referral to Gastroenterology     Follow-up: Return in about 6 months (around 03/12/2023) for chronic fasting follow up - sign for records from GYN.  An After Visit Summary was printed and given to the patient.  Yetta Flock [facility] [phone]

## 2022-09-11 LAB — CBC WITH DIFFERENTIAL/PLATELET
Basophils Absolute: 0 10*3/uL (ref 0.0–0.2)
Basos: 0 %
EOS (ABSOLUTE): 0.1 10*3/uL (ref 0.0–0.4)
Eos: 2 %
Hematocrit: 41.4 % (ref 34.0–46.6)
Hemoglobin: 13.7 g/dL (ref 11.1–15.9)
Immature Grans (Abs): 0 10*3/uL (ref 0.0–0.1)
Immature Granulocytes: 0 %
Lymphocytes Absolute: 1.5 10*3/uL (ref 0.7–3.1)
Lymphs: 31 %
MCH: 30.1 pg (ref 26.6–33.0)
MCHC: 33.1 g/dL (ref 31.5–35.7)
MCV: 91 fL (ref 79–97)
Monocytes Absolute: 0.3 10*3/uL (ref 0.1–0.9)
Monocytes: 6 %
Neutrophils Absolute: 2.9 10*3/uL (ref 1.4–7.0)
Neutrophils: 61 %
Platelets: 266 10*3/uL (ref 150–450)
RBC: 4.55 x10E6/uL (ref 3.77–5.28)
RDW: 12.4 % (ref 11.7–15.4)
WBC: 4.9 10*3/uL (ref 3.4–10.8)

## 2022-09-11 LAB — COMPREHENSIVE METABOLIC PANEL
ALT: 11 IU/L (ref 0–32)
AST: 18 IU/L (ref 0–40)
Albumin/Globulin Ratio: 1.9 (ref 1.2–2.2)
Albumin: 4.5 g/dL (ref 3.9–4.9)
Alkaline Phosphatase: 73 IU/L (ref 44–121)
BUN/Creatinine Ratio: 14 (ref 9–23)
BUN: 9 mg/dL (ref 6–24)
Bilirubin Total: 0.3 mg/dL (ref 0.0–1.2)
CO2: 22 mmol/L (ref 20–29)
Calcium: 10.1 mg/dL (ref 8.7–10.2)
Chloride: 106 mmol/L (ref 96–106)
Creatinine, Ser: 0.66 mg/dL (ref 0.57–1.00)
Globulin, Total: 2.4 g/dL (ref 1.5–4.5)
Glucose: 91 mg/dL (ref 70–99)
Potassium: 4.5 mmol/L (ref 3.5–5.2)
Sodium: 142 mmol/L (ref 134–144)
Total Protein: 6.9 g/dL (ref 6.0–8.5)
eGFR: 110 mL/min/{1.73_m2} (ref >59)

## 2022-09-11 LAB — TSH: TSH: 1.05 u[IU]/mL (ref 0.450–4.500)

## 2022-10-24 ENCOUNTER — Encounter: Payer: Self-pay | Admitting: Internal Medicine

## 2022-10-25 ENCOUNTER — Other Ambulatory Visit: Payer: Self-pay | Admitting: Internal Medicine

## 2022-10-25 DIAGNOSIS — M797 Fibromyalgia: Secondary | ICD-10-CM

## 2022-10-25 NOTE — Telephone Encounter (Signed)
Next Visit: 02/11/2023  Last Visit: 08/13/2022  Last Fill: 07/31/2022  Dx: Fibromyalgia syndrome   Current Dose per office note on 08/13/2022: Cymbalta 60 mg p.o. daily.   Okay to refill Cymbalta?

## 2022-10-26 NOTE — Telephone Encounter (Signed)
It has been about 6 months since we saw her for the wrist injection and almost 3 months since last clinic visit. It could be reasonable to try the wrist injection this again or at least take another look at her swelling. Currently planned f/u in March, we can reschedule her for later this month if interested 12/18 or later would be about 81mo since last visit.

## 2022-11-07 LAB — HM COLONOSCOPY

## 2022-11-23 ENCOUNTER — Encounter: Payer: Self-pay | Admitting: Physician Assistant

## 2022-11-23 ENCOUNTER — Encounter: Payer: Self-pay | Admitting: Specialist

## 2022-12-05 ENCOUNTER — Ambulatory Visit: Payer: BC Managed Care – PPO | Attending: Internal Medicine | Admitting: Internal Medicine

## 2022-12-05 ENCOUNTER — Ambulatory Visit: Payer: BC Managed Care – PPO

## 2022-12-05 ENCOUNTER — Encounter: Payer: Self-pay | Admitting: Internal Medicine

## 2022-12-05 VITALS — BP 105/68 | HR 87 | Resp 12 | Ht 65.0 in | Wt 134.6 lb

## 2022-12-05 DIAGNOSIS — G5603 Carpal tunnel syndrome, bilateral upper limbs: Secondary | ICD-10-CM

## 2022-12-05 MED ORDER — LIDOCAINE HCL 1 % IJ SOLN
1.0000 mL | INTRAMUSCULAR | Status: AC | PRN
  Administered 2022-12-05: 1 mL

## 2022-12-05 MED ORDER — TRIAMCINOLONE ACETONIDE 40 MG/ML IJ SUSP
40.0000 mg | INTRAMUSCULAR | Status: AC | PRN
  Administered 2022-12-05: 40 mg

## 2022-12-05 NOTE — Progress Notes (Signed)
Procedure Note  Patient: Sarah [name]             Date of Birth: [birth year]           MRN: [id number]             Visit Date: 12/05/2022  Procedures: Visit Diagnoses:  1. Carpal tunnel syndrome, bilateral     Patient presented today for bilateral ultrasound-guided carpal tunnel steroid injection due to progressively worsening hand pain and numbness again since November of last year.  Reviewed previous procedure which which we did in June after which she initially had complete improvement in symptoms before they started to return.  Tolerated the previous injections with no adverse effect.  Injections performed today under ultrasound guidance both sides.  Patient experienced a brief vagal episode which recovered after approximately 10 minutes waiting.  Provided printed range of motion exercises for carpal and cubital tunnel symptoms.    Hand/UE Inj: L carpal tunnel for carpal tunnel syndrome on 12/05/2022 2:30 PM Indications: pain and therapeutic Details: 25 G needle, ultrasound-guided radial approach Medications: 1 mL lidocaine 1 %; 40 mg triamcinolone acetonide 40 MG/ML Procedure, treatment alternatives, risks and benefits explained, specific risks discussed. Consent was given by the patient. Immediately prior to procedure a time out was called to verify the correct patient, procedure, equipment, support staff and site/side marked as required. Patient was prepped and draped in the usual sterile fashion.    Hand/UE Inj: R carpal tunnel for carpal tunnel syndrome on 12/05/2022 2:40 PM Indications: pain and therapeutic Details: 25 G needle, ultrasound-guided ulnar approach Medications: 1 mL lidocaine 1 %; 40 mg triamcinolone acetonide 40 MG/ML Consent was given by the patient. Immediately prior to procedure a time out was called to verify the correct patient, procedure, equipment, support staff and site/side marked as required. Patient was prepped and draped in the usual sterile fashion.      Left wrist carpal tunnel injection   Ultrasound guided injection is preferred based studies that show increased  duration, increased effect, greater accuracy, decreased procedural pain,  increased response rate, and decreased cost with ultrasound guided versus  blind injection.   Verbal informed consent obtained.  Time-out conducted.   Noted no overlying erythema, induration, or other signs of local  infection. Ultrasound-guided left carpal tunnel injection.  After sterile  prep with Betadine, injected 1 mL 1% lidocaine and 40 mg kenalog using a  25g needle by radial approach.   Image 1 show needle just states again to field-of-view.  Image 2 needle  position deep to the median nerve injection medication.  Image 3 with  needle advanced further into carpal tunnel space.  Images 4 through 8  showing injection of remaining medication volume.    Right wrist carpal tunnel injection   Ultrasound guided injection is preferred based studies that show increased  duration, increased effect, greater accuracy, decreased procedural pain,  increased response rate, and decreased cost with ultrasound guided versus  blind injection.   Verbal informed consent obtained.  Time-out conducted.   Noted no overlying erythema, induration, or other signs of local  infection. Ultrasound-guided right carpal tunnel injection. After sterile  prep with Betadine, injected 1 mL 1% lidocaine and 40 mg kenalog using a  25 g needle from ulnar approach.   Image 1 shows positioning of needle adjacent to the median nerve.  Images  2 through 4 show continued injection of medication.  Images 5 and 6 show  advancement of needle deep to  the median nerve and injection of remaining  volume of medication.

## 2022-12-15 ENCOUNTER — Other Ambulatory Visit: Payer: Self-pay | Admitting: Physician Assistant

## 2022-12-17 ENCOUNTER — Other Ambulatory Visit: Payer: Self-pay

## 2023-02-06 ENCOUNTER — Encounter: Payer: Self-pay | Admitting: Physician Assistant

## 2023-02-06 ENCOUNTER — Ambulatory Visit (INDEPENDENT_AMBULATORY_CARE_PROVIDER_SITE_OTHER): Payer: BC Managed Care – PPO | Admitting: Physician Assistant

## 2023-02-06 VITALS — BP 100/64 | HR 91 | Temp 97.2°F | Ht 65.0 in | Wt 127.0 lb

## 2023-02-06 DIAGNOSIS — K219 Gastro-esophageal reflux disease without esophagitis: Secondary | ICD-10-CM | POA: Diagnosis not present

## 2023-02-06 DIAGNOSIS — R110 Nausea: Secondary | ICD-10-CM | POA: Diagnosis not present

## 2023-02-06 DIAGNOSIS — R5381 Other malaise: Secondary | ICD-10-CM | POA: Diagnosis not present

## 2023-02-06 DIAGNOSIS — J069 Acute upper respiratory infection, unspecified: Secondary | ICD-10-CM

## 2023-02-06 LAB — CBC WITH DIFFERENTIAL/PLATELET
Basophils Absolute: 0 10*3/uL (ref 0.0–0.2)
Basos: 1 %
EOS (ABSOLUTE): 0.1 10*3/uL (ref 0.0–0.4)
Eos: 1 %
Hematocrit: 42.4 % (ref 34.0–46.6)
Hemoglobin: 14.3 g/dL (ref 11.1–15.9)
Immature Grans (Abs): 0 10*3/uL (ref 0.0–0.1)
Immature Granulocytes: 0 %
Lymphocytes Absolute: 1.4 10*3/uL (ref 0.7–3.1)
Lymphs: 24 %
MCH: 29.5 pg (ref 26.6–33.0)
MCHC: 33.7 g/dL (ref 31.5–35.7)
MCV: 87 fL (ref 79–97)
Monocytes Absolute: 0.7 10*3/uL (ref 0.1–0.9)
Monocytes: 11 %
Neutrophils Absolute: 3.8 10*3/uL (ref 1.4–7.0)
Neutrophils: 63 %
Platelets: 284 10*3/uL (ref 150–450)
RBC: 4.85 x10E6/uL (ref 3.77–5.28)
RDW: 13.4 % (ref 11.7–15.4)
WBC: 6 10*3/uL (ref 3.4–10.8)

## 2023-02-06 LAB — COMPREHENSIVE METABOLIC PANEL
ALT: 18 IU/L (ref 0–32)
AST: 21 IU/L (ref 0–40)
Albumin/Globulin Ratio: 1.7 (ref 1.2–2.2)
Albumin: 3.8 g/dL — ABNORMAL LOW (ref 3.9–4.9)
Alkaline Phosphatase: 64 IU/L (ref 44–121)
BUN/Creatinine Ratio: 10 (ref 9–23)
BUN: 7 mg/dL (ref 6–24)
Bilirubin Total: 0.2 mg/dL (ref 0.0–1.2)
CO2: 23 mmol/L (ref 20–29)
Calcium: 9.8 mg/dL (ref 8.7–10.2)
Chloride: 102 mmol/L (ref 96–106)
Creatinine, Ser: 0.71 mg/dL (ref 0.57–1.00)
Globulin, Total: 2.3 g/dL (ref 1.5–4.5)
Glucose: 89 mg/dL (ref 70–99)
Potassium: 3.8 mmol/L (ref 3.5–5.2)
Sodium: 140 mmol/L (ref 134–144)
Total Protein: 6.1 g/dL (ref 6.0–8.5)
eGFR: 107 mL/min/{1.73_m2} (ref >59)

## 2023-02-06 LAB — POC COVID19 BINAXNOW: SARS Coronavirus 2 Ag: NEGATIVE

## 2023-02-06 LAB — POCT INFLUENZA A/B
Influenza A, POC: NEGATIVE
Influenza B, POC: NEGATIVE

## 2023-02-06 MED ORDER — PANTOPRAZOLE SODIUM 40 MG PO TBEC: 40.0000 mg | DELAYED_RELEASE_TABLET | Freq: Every day | ORAL | 3 refills | Status: DC

## 2023-02-06 MED ORDER — ONDANSETRON HCL 4 MG PO TABS: 4.0000 mg | ORAL_TABLET | Freq: Three times a day (TID) | ORAL | 0 refills | Status: DC | PRN

## 2023-02-06 NOTE — Progress Notes (Signed)
Acute Office Visit  Subjective:    Patient ID: Sarah [name], female    DOB: March 11, 1976, 47 y.o.   MRN: [id number]  Chief Complaint  Patient presents with   Cough   Nausea   Fatigue   Ear Pain   Diarrhea    HPI: Patient is in today for complaints of mild congestion and cough - also has had intermittent nausea, vomiting and diarrhea since the weekend.  States her last vomiting episode was on Monday and only one loose stool today and two yesterday (which is improving) She is drinking well but not eating well.  Denies fever or abdominal pain Has been belching a lot and 'tasting eggs' - states her omeprazole not helping Past Medical History:  Diagnosis Date   Carpal tunnel syndrome    Hernia, hiatal    Hypercalcemia 01/06/2021   Left breast mass 01/26/2021   Liver cyst    Malaise 01/06/2021   Need for tetanus, diphtheria, and acellular pertussis (Tdap) vaccine 01/06/2021   Palpitations 01/26/2021   Polyuria 01/06/2021   Scoliosis    Weight loss 01/02/2021    Past Surgical History:  Procedure Laterality Date   CERVICAL SPINE SURGERY     Left fooscrew and plate x2t      TUBAL LIGATION      Family History  Problem Relation Age of Onset   Heart disease Mother    Hyperlipidemia Mother    Anxiety disorder Sister    GER disease Son    Asthma Son    GER disease Son    Heart disease Son     Social History   Socioeconomic History   Marital status: Single    Spouse name: Not on file   Number of children: Not on file   Years of education: Not on file   Highest education level: Not on file  Occupational History   Not on file  Tobacco Use   Smoking status: Every Day    Packs/day: 0.50    Years: 10.00    Total pack years: 5.00    Types: Cigarettes    Passive exposure: Never   Smokeless tobacco: Never  Vaping Use   Vaping Use: Never used  Substance and Sexual Activity   Alcohol use: Not Currently   Drug use: Never   Sexual activity: Not Currently  Other  Topics Concern   Not on file  Social History Narrative   Not on file   Social Determinants of Health   Financial Resource Strain: Not on file  Food Insecurity: Not on file  Transportation Needs: Not on file  Physical Activity: Not on file  Stress: Not on file  Social Connections: Not on file  Intimate Partner Violence: Not on file    Outpatient Medications Prior to Visit  Medication Sig Dispense Refill   celecoxib (CELEBREX) 200 MG capsule TAKE 1 CAPSULE BY MOUTH TWICE A DAY 30 capsule 2   dicyclomine (BENTYL) 10 MG capsule Take 10 mg by mouth every 6 (six) hours as needed.     DULoxetine (CYMBALTA) 60 MG capsule TAKE 1 CAPSULE BY MOUTH EVERY DAY 90 capsule 1   famotidine (PEPCID) 40 MG tablet Take 40 mg by mouth 2 (two) times daily.     omeprazole (PRILOSEC) 40 MG capsule Take 40 mg by mouth every morning.     No facility-administered medications prior to visit.    Allergies  Allergen Reactions   Meloxicam Other (See Comments)    eyesight, patient can take  ibuprofen and Nsaids fine.    Review of Systems CONSTITUTIONAL: Negative for chills, fatigue, fever,  E/N/T: see HPI CARDIOVASCULAR: Negative for chest pain, dizziness, palpitations RESPIRATORY: see HPI GASTROINTESTINAL: see HPI         Objective:    PHYSICAL EXAM:   VS: BP 100/64 (BP Location: Left Arm, Patient Position: Sitting, Cuff Size: Normal)   Pulse 91   Temp (!) 97.2 F (36.2 C) (Temporal)   Ht '5\' 5"'$  (1.651 m)   Wt 127 lb (57.6 kg)   SpO2 97%   BMI 21.13 kg/m   GEN: Well nourished, well developed, in no acute distress - malodorous HEENT: normal external ears and nose - normal external auditory canals and TMS - - Lips, Teeth and Gums - normal  Oropharynx - normal mucosa, palate, and posterior pharynx Cardiac: RRR; no murmurs, Respiratory:  normal respiratory rate and pattern with no distress - normal breath sounds with no rales, rhonchi, wheezes or rubs GI: normal bowel sounds, no masses or  tenderness  Office Visit on 02/06/2023  Component Date Value Ref Range Status   Influenza A, POC 02/06/2023 Negative  Negative Final   Influenza B, POC 02/06/2023 Negative  Negative Final   SARS Coronavirus 2 Ag 02/06/2023 Negative  Negative Final       Lab Results  Component Value Date   TSH 1.050 09/10/2022   Lab Results  Component Value Date   WBC 4.9 09/10/2022   HGB 13.7 09/10/2022   HCT 41.4 09/10/2022   MCV 91 09/10/2022   PLT 266 09/10/2022   Lab Results  Component Value Date   NA 142 09/10/2022   K 4.5 09/10/2022   CO2 22 09/10/2022   GLUCOSE 91 09/10/2022   BUN 9 09/10/2022   CREATININE 0.66 09/10/2022   BILITOT 0.3 09/10/2022   ALKPHOS 73 09/10/2022   AST 18 09/10/2022   ALT 11 09/10/2022   PROT 6.9 09/10/2022   ALBUMIN 4.5 09/10/2022   CALCIUM 10.1 09/10/2022   EGFR 110 09/10/2022   No results found for: "CHOL" No results found for: "HDL" No results found for: "LDLCALC" No results found for: "TRIG" No results found for: "CHOLHDL" Lab Results  Component Value Date   HGBA1C 5.4 01/02/2021       Assessment & Plan:  Acute upper respiratory infection -     POCT Influenza A/B -     POC COVID-19 BinaxNow Recommend otc decongestants  Nausea -     Ondansetron HCl; Take 1 tablet (4 mg total) by mouth every 8 (eight) hours as needed for nausea or vomiting.  Dispense: 20 tablet; Refill: 0  Gastroesophageal reflux disease without esophagitis -     Pantoprazole Sodium; Take 1 tablet (40 mg total) by mouth daily.  Dispense: 30 tablet; Refill: 3 Stop omeprazole and try this to see if helps symptoms better Malaise -     CBC with Differential/Platelet -     Comprehensive metabolic panel     Meds ordered this encounter  Medications   pantoprazole (PROTONIX) 40 MG tablet    Sig: Take 1 tablet (40 mg total) by mouth daily.    Dispense:  30 tablet    Refill:  3    Order Specific Question:   Supervising Provider    Answer:   COX, KIRSTEN [id number]    ondansetron (ZOFRAN) 4 MG tablet    Sig: Take 1 tablet (4 mg total) by mouth every 8 (eight) hours as needed for nausea or vomiting.  Dispense:  20 tablet    Refill:  0    Order Specific Question:   Supervising Provider    AnswerShelton Silvas    Orders Placed This Encounter  Procedures   CBC with Differential/Platelet   Comprehensive metabolic panel   POCT Influenza A/B   POC COVID-19 BinaxNow     Follow-up: No follow-ups on file.  An After Visit Summary was printed and given to the patient.  Yetta Flock [facility] [phone]

## 2023-02-10 NOTE — Progress Notes (Signed)
Office Visit Note  Patient: Sarah [name]             Date of Birth: 09/13/1976           MRN: [id number]             PCP: Marge Duncans, PA-C Referring: Marge Duncans, PA-C Visit Date: 02/11/2023   Subjective:  Follow-up (Patient states she has sores in her nose. Patient states she would like to go over her recent lab results.)   History of Present Illness: Sarah [name] is a 47 y.o. female here for follow up for carpal tunnel syndrome and fibromyalgia on cymbalta 60 mg and recent wrist injections January. Recent GI illness diarrhea and not eating labs last Wednesday low albumin Thursday with low electrolytes. Getting more muscle cramping in both legs but not a full spasm or cramp like she has normally had in the past. Now eating well again since yesterday. Flexeril helps but only taking sometimes due to drowsiness.  Previous HPI 08/13/22 Sarah [name] is a 47 y.o. female here for follow up with joint pains and myofascial pain on cymbalta 60 mg daily and celebrex 200 mg daily or as needed. 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 She notices some shakiness or tremor in her hands that might be increased. Neck pain and muscle soreness. She has some elbow pains and there is a rash on her left elbow just in the past week or two. She also is having pain around her right hip and radiating into the right leg sometimes often after standing from driving for [id number] minutes.   Previous HPI 12/05/22 Patient presented today for bilateral ultrasound-guided carpal tunnel steroid injection due to progressively worsening hand pain and numbness again since November of last year.  Reviewed previous procedure which which we did in June after which she initially had complete improvement in symptoms before they started to return.  Tolerated the previous injections with no adverse effect.    Injections performed today under ultrasound guidance both sides.  Patient experienced a brief vagal episode which recovered after approximately 10 minutes waiting.  Provided printed range of motion exercises for carpal and cubital tunnel symptoms.  02/08/2022  Sarah [name] is a 47 y.o. female here for follow up with joint pains and myofascial pain after starting cymbalta and titrate to 60 mg and with celebrex 200 mg BID. She noticed an improvement in pain of both legs after increasing the cymbalta dose to 60 mg. She has intermittent hand numbness continuing to come and go in both hands, bothering her a bit more than before. She reports one incident of using too hot water for her son with inability to feel temperature.   Previous HPI 11/09/21 CECILI [name] is a 47 y.o. female here for follow up with multiple symptoms including joint pains, fatigue, numbness, and unintentional weight loss with positive ANA serology. Labs at initial visit showing positive RNP 1.5 otherwise normal CK norma complements low ANA titer 1:40. She continues having ongoing symptoms with pain all over worst in legs at the moment and very fatigued.   Previous HPI 10/12/21 Sarah [name] is a 47 y.o. female here for joint pains, numbness, fatigue, and weight loss with positive ANA testing.  Symptoms have really been ongoing since last year no specific onset or proceeding infections or medical events that she can  recall.  Initially this was accompanied by a generalized body aches and fatigue as well as a progressive unexplained weight loss from her baseline down to as low as 105 pounds.  She has had some amount of chronic joint pain previous evaluations of bilateral rotator cuff arthropathy, mild degenerative disc disease in the lumbar spine, previous right leg fracture with residual swelling intermittently at the ankle and knee.  However these were increased additionally with problems of a lot of stiffness and pain in the  morning and getting up from stationary positions particularly in bilateral hips. Particularly notices pain whenever she has to drive for an hour or longer at a time.  These have improved a lot on Celebrex but she feels symptoms quickly worsen again if off the medication. More recently she is also started having muscle cramping particularly in her legs not associated with any particular position or activity.  She also reports numbness and tingling sensation particularly in bilateral hands this occurs overnight and first thing in the morning as well as periodically throughout the day.  She had nerve conduction study for this reportedly showing minimal carpal tunnel syndrome not felt to adequately explain symptoms.  She tried wearing wrist braces for this with no symptom improvement.  Throughout that time she denied any loss of appetite only gastrointestinal symptom was increase in loose frequent stools not associated with any pain or bleeding.  She described frequent nighttime awakening often every 1-2 hours without specific causes and has severe fatigue and daytime somnolence able to fall asleep unintentionally whenever she stops moving. She denies new hair loss, oral ulcers, raynaud's symptoms, lymphadenopathy, or history of blood clots. She reports dry mouth and dry skin symptoms but no problems with her eyes. 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 She has headaches usually bilateral lasting for up to a few hours at a time. She has urinary urgency with occasional urge incontinence. She does not report allodynia symptoms.   LAbs reviewed 08/2021 ANA pos RF neg CCP neg ESR 3 CRP <1 TSH 2.54 CBC wnl CMP Ca 10.4   Imaging reviewed 04/21/21 MR Cardiac stress test IMPRESSION: 1.  Normal stress perfusion 2.  No evidence of cardiac amyloidosis 3.  Normal LV size, wall thickness, and systolic function (EF [id number]) 4.  Normal RV size and  systolic function (EF [id number]) 5.  No late gadolinium enhancement to suggest myocardial scar   Review of Systems  Constitutional:  Positive for fatigue.  HENT:  Positive for mouth dryness. Negative for mouth sores.   Eyes:  Positive for dryness.  Respiratory:  Negative for shortness of breath.   Cardiovascular:  Positive for chest pain. Negative for palpitations.  Gastrointestinal:  Positive for blood in stool and diarrhea. Negative for constipation.  Endocrine: Positive for increased urination.  Genitourinary:  Positive for involuntary urination.  Musculoskeletal:  Positive for joint pain, gait problem, joint pain, joint swelling, myalgias, muscle weakness, morning stiffness, muscle tenderness and myalgias.  Skin:  Positive for hair loss. Negative for color change, rash and sensitivity to sunlight.  Allergic/Immunologic: Positive for susceptible to infections.  Neurological:  Positive for dizziness and headaches.  Hematological:  Negative for swollen glands.  Psychiatric/Behavioral:  Positive for sleep disturbance. Negative for depressed mood. The patient is not nervous/anxious.     PMFS History:  Patient Active Problem List   Diagnosis Date Noted   Screening for colon cancer 09/10/2022   Gastroesophageal reflux disease without  esophagitis 09/10/2022   Needs flu shot 09/10/2022   Vitamin D deficiency 08/13/2022   Pain in right hip 08/13/2022   Facial rash 02/08/2022   Fibromyalgia 11/09/2021   Positive ANA (antinuclear antibody) 10/12/2021   Impingement syndrome of right shoulder 07/07/2021   Labral tear of shoulder, degenerative, right 07/07/2021   Carpal tunnel syndrome, bilateral 07/07/2021   DDD (degenerative disc disease), cervical 07/07/2021   Other fatigue 05/08/2021   Leg cramps 05/08/2021   Frequency of urination 05/08/2021   Irregular menses 05/08/2021   Scoliosis    Palpitations 01/26/2021   Left breast mass 01/26/2021   Polyuria 01/06/2021   Hypercalcemia  01/06/2021   Malaise 01/06/2021   Need for tetanus, diphtheria, and acellular pertussis (Tdap) vaccine 01/06/2021   Weight loss 01/02/2021   Hyperglycemia 01/02/2021    Past Medical History:  Diagnosis Date   Carpal tunnel syndrome    Hernia, hiatal    Hypercalcemia 01/06/2021   Left breast mass 01/26/2021   Liver cyst    Malaise 01/06/2021   Need for tetanus, diphtheria, and acellular pertussis (Tdap) vaccine 01/06/2021   Palpitations 01/26/2021   Polyuria 01/06/2021   Scoliosis    Weight loss 01/02/2021    Family History  Problem Relation Age of Onset   Heart disease Mother    Hyperlipidemia Mother    Anxiety disorder Sister    GER disease Son    Asthma Son    GER disease Son    Heart disease Son    Past Surgical History:  Procedure Laterality Date   CERVICAL SPINE SURGERY     Left fooscrew and plate x2t      TUBAL LIGATION     Social History   Social History Narrative   Not on file   Immunization History  Administered Date(s) Administered   Influenza,inj,Quad PF,6+ Mos 09/10/2022   PFIZER(Purple Top)SARS-COV-2 Vaccination 06/28/2020, 07/19/2020   Tdap 01/06/2021     Objective: Vital Signs: BP 107/71 (BP Location: Left Arm, Patient Position: Sitting, Cuff Size: Normal)   Pulse 98   Resp 16   Ht 5\' 5"  (1.651 m)   Wt 129 lb (58.5 kg)   LMP 01/25/2023   BMI 21.47 kg/m    Physical Exam Cardiovascular:     Rate and Rhythm: Normal rate and regular rhythm.  Pulmonary:     Effort: Pulmonary effort is normal.     Breath sounds: Normal breath sounds.  Musculoskeletal:     Right lower leg: No edema.     Left lower leg: No edema.  Skin:    General: Skin is warm and dry.     Findings: No rash.  Neurological:     Mental Status: She is alert.  Psychiatric:        Mood and Affect: Mood normal.      Musculoskeletal Exam:  Shoulders full ROM no tenderness or swelling Elbows full ROM no tenderness or swelling Wrists full ROM no tenderness or  swelling Fingers full ROM no tenderness or swelling Knees full ROM no tenderness or swelling  Investigation: No additional findings.  Imaging: No results found.  Recent Labs: Lab Results  Component Value Date   WBC 6.0 02/06/2023   HGB 14.3 02/06/2023   PLT 284 02/06/2023   NA 140 02/06/2023   K 3.8 02/06/2023   CL 102 02/06/2023   CO2 23 02/06/2023   GLUCOSE 89 02/06/2023   BUN 7 02/06/2023   CREATININE 0.71 02/06/2023   BILITOT 0.2 02/06/2023   ALKPHOS  64 02/06/2023   AST 21 02/06/2023   ALT 18 02/06/2023   PROT 6.1 02/06/2023   ALBUMIN 3.8 (L) 02/06/2023   CALCIUM 9.8 02/06/2023   GFRAA 115 01/06/2021    Speciality Comments: No specialty comments available.  Procedures:  No procedures performed Allergies: Meloxicam   Assessment / Plan:     Visit Diagnoses: Carpal tunnel syndrome, bilateral  Unfortunately having the ongoing pain numbness and difficulty using her hands as it was suspected to be from carpal tunnel syndrome before.  But did not get similar relief with trial of repeat ultrasound-guided steroid injections recently.  Will refer for nerve conduction study to evaluate and confirm this is the source of symptoms due to the lack of benefit with the repeat and local treatment.  If so probably needs referral to hand surgery about possible carpal tunnel release surgery.  Fibromyalgia  Symptoms pretty stable with overall fatigue and soreness.  A bit worse after recent illness but more of generalized symptoms.  Continue the Cymbalta 60 mg p.o. daily which is moderately helpful.  Orders: No orders of the defined types were placed in this encounter.  No orders of the defined types were placed in this encounter.    Follow-Up Instructions: Return if symptoms worsen or fail to improve.   Collier Salina, MD  Note - This record has been created using [employer].  Chart creation errors have been sought, but may not always  have been located. Such  creation errors do not reflect on  the standard of medical care.

## 2023-02-11 ENCOUNTER — Encounter: Payer: Self-pay | Admitting: Internal Medicine

## 2023-02-11 ENCOUNTER — Ambulatory Visit: Payer: BC Managed Care – PPO | Attending: Internal Medicine | Admitting: Internal Medicine

## 2023-02-11 VITALS — BP 107/71 | HR 98 | Resp 16 | Ht 65.0 in | Wt 129.0 lb

## 2023-02-11 DIAGNOSIS — G5603 Carpal tunnel syndrome, bilateral upper limbs: Secondary | ICD-10-CM | POA: Diagnosis not present

## 2023-02-11 DIAGNOSIS — M797 Fibromyalgia: Secondary | ICD-10-CM

## 2023-02-11 MED ORDER — DULOXETINE HCL 60 MG PO CPEP: 60.0000 mg | ORAL_CAPSULE | Freq: Every day | ORAL | 1 refills | Status: DC

## 2023-02-25 ENCOUNTER — Other Ambulatory Visit: Payer: Self-pay | Admitting: *Deleted

## 2023-02-25 ENCOUNTER — Other Ambulatory Visit: Payer: Self-pay | Admitting: Physician Assistant

## 2023-02-25 ENCOUNTER — Encounter: Payer: Self-pay | Admitting: Internal Medicine

## 2023-02-25 DIAGNOSIS — G5603 Carpal tunnel syndrome, bilateral upper limbs: Secondary | ICD-10-CM

## 2023-03-18 ENCOUNTER — Ambulatory Visit: Payer: BC Managed Care – PPO | Admitting: Physician Assistant

## 2023-03-18 ENCOUNTER — Encounter: Payer: Self-pay | Admitting: Physician Assistant

## 2023-03-18 VITALS — BP 102/64 | HR 78 | Temp 97.5°F | Ht 65.0 in | Wt 134.6 lb

## 2023-03-18 DIAGNOSIS — Z83438 Family history of other disorder of lipoprotein metabolism and other lipidemia: Secondary | ICD-10-CM | POA: Diagnosis not present

## 2023-03-18 DIAGNOSIS — R109 Unspecified abdominal pain: Secondary | ICD-10-CM | POA: Insufficient documentation

## 2023-03-18 DIAGNOSIS — R5383 Other fatigue: Secondary | ICD-10-CM

## 2023-03-18 DIAGNOSIS — R350 Frequency of micturition: Secondary | ICD-10-CM

## 2023-03-18 DIAGNOSIS — K219 Gastro-esophageal reflux disease without esophagitis: Secondary | ICD-10-CM

## 2023-03-18 DIAGNOSIS — M797 Fibromyalgia: Secondary | ICD-10-CM

## 2023-03-18 LAB — POCT URINALYSIS DIP (CLINITEK)
Bilirubin, UA: NEGATIVE
Blood, UA: NEGATIVE
Glucose, UA: NEGATIVE mg/dL
Ketones, POC UA: NEGATIVE mg/dL
Leukocytes, UA: NEGATIVE
Nitrite, UA: NEGATIVE
POC PROTEIN,UA: NEGATIVE
Spec Grav, UA: 1.01 (ref 1.010–1.025)
Urobilinogen, UA: 0.2 E.U./dL
pH, UA: 6 (ref 5.0–8.0)

## 2023-03-18 NOTE — Progress Notes (Signed)
Subjective:  Patient ID: Sarah [name], female    DOB: 02-Apr-1976  Age: 47 y.o. MRN: [id number]  Chief Complaint  Patient presents with   Fibromyalgia      Pt in for follow up of fibromyalgia - she states she is doing well on cymbalta  daily beut recently has been having more fatigue than usual States she has been referred to neurology by her rheumatologist for evaluation of hand tremors She also uses celebrex  qd  Pt with history of vit D def and has been on weekly supplements - Dr Dimple Casey (her rheumatologist) changed her over to taking daily supplement  Pt with history of GERD and hiatal hernia - stable on pepcid 40 and protonix  qd  Pt states she was at ED recently and when CT was done for abdominal pain she was found to have a kidney stone.  States she has intermittent flank pain.  Having slight urinary frequency today Current Outpatient Medications on File Prior to Visit  Medication Sig Dispense Refill   celecoxib (CELEBREX) 200 MG capsule TAKE 1 CAPSULE BY MOUTH TWICE A DAY 30 capsule 2   Cholecalciferol (VITAMIN D) 50 MCG (2000 UT) CAPS Take by mouth.     dicyclomine (BENTYL) 10 MG capsule Take 10 mg by mouth every 6 (six) hours as needed.     DULoxetine (CYMBALTA) 60 MG capsule Take 1 capsule (60 mg total) by mouth daily. 90 capsule 1   famotidine (PEPCID) 40 MG tablet Take 40 mg by mouth 2 (two) times daily.     ondansetron (ZOFRAN) 4 MG tablet Take 1 tablet (4 mg total) by mouth every 8 (eight) hours as needed for nausea or vomiting. 20 tablet 0   pantoprazole (PROTONIX) 40 MG tablet Take 1 tablet (40 mg total) by mouth daily. 30 tablet 3   No current facility-administered medications on file prior to visit.   Past Medical History:  Diagnosis Date   Carpal tunnel syndrome    Hernia, hiatal    Hypercalcemia 01/06/2021   Left breast mass 01/26/2021   Liver cyst    Malaise 01/06/2021   Need for tetanus, diphtheria, and acellular pertussis (Tdap) vaccine  01/06/2021   Palpitations 01/26/2021   Polyuria 01/06/2021   Scoliosis    Weight loss 01/02/2021   Past Surgical History:  Procedure Laterality Date   CERVICAL SPINE SURGERY     Left fooscrew and plate [id number]      TUBAL LIGATION      Family History  Problem Relation Age of Onset   Heart disease Mother    Hyperlipidemia Mother    Anxiety disorder Sister    GER disease Son    Asthma Son    GER disease Son    Heart disease Son    Social History   Socioeconomic History   Marital status: Single    Spouse name: Not on file   Number of children: Not on file   Years of education: Not on file   Highest education level: Not on file  Occupational History   Not on file  Tobacco Use   Smoking status: Every Day    Packs/day: 0.50    Years: 10.00    Additional pack years: 0.00    Total pack years: 5.00    Types: Cigarettes    Passive exposure: Never   Smokeless tobacco: Never  Vaping Use   Vaping Use: Never used  Substance and Sexual Activity   Alcohol use: Not Currently  Drug use: Never   Sexual activity: Not Currently  Other Topics Concern   Not on file  Social History Narrative   Not on file   Social Determinants of Health   Financial Resource Strain: Not on file  Food Insecurity: Not on file  Transportation Needs: Not on file  Physical Activity: Not on file  Stress: Not on file  Social Connections: Not on file   CONSTITUTIONAL:see HPI  E/N/T: Negative for ear pain, nasal congestion and sore throat.  CARDIOVASCULAR: Negative for chest pain, dizziness, palpitations and pedal edema.  RESPIRATORY: Negative for recent cough and dyspnea.  GASTROINTESTINAL: Negative for abdominal pain, acid reflux symptoms, constipation, diarrhea, nausea and vomiting.  Gu - see HPI MSK: Negative for arthralgias and myalgias.  INTEGUMENTARY: Negative for rash.  NEUROLOGICAL: Negative for dizziness and headaches.  PSYCHIATRIC: Negative for sleep disturbance and to question depression  screen.  Negative for depression, negative for anhedonia.       Objective:  PHYSICAL EXAM:   VS: BP 102/64 (BP Location: Left Arm, Patient Position: Sitting, Cuff Size: Normal)   Pulse 78   Temp (!) 97.5 F (36.4 C) (Temporal)   Ht  (1.651 m)   Wt 134 lb 9.6 oz (61.1 kg)   SpO2 98%   BMI 22.40 kg/m   GEN: Well nourished, well developed, in no acute distress  Cardiac: RRR; no murmurs, rubs, or gallops,no edema -  Respiratory:  normal respiratory rate and pattern with no distress - normal breath sounds with no rales, rhonchi, wheezes or rubs MS: no deformity or atrophy  Skin: warm and dry, no rash  Psych: euthymic mood, appropriate affect and demeanor  Office Visit on 03/18/2023  Component Date Value Ref Range Status   Color, UA 03/18/2023 yellow  yellow Final   Clarity, UA 03/18/2023 clear  clear Final   Glucose, UA 03/18/2023 negative  negative mg/dL Final   Bilirubin, UA 30/86/5784 negative  negative Final   Ketones, POC UA 03/18/2023 negative  negative mg/dL Final   Spec Grav, UA 69/62/9528 1.010  1.010 - 1.025 Final   Blood, UA 03/18/2023 negative  negative Final   pH, UA 03/18/2023 6.0  5.0 - 8.0 Final   POC PROTEIN,UA 03/18/2023 negative  negative, trace Final   Urobilinogen, UA 03/18/2023 0.2  0.2 or 1.0 E.U./dL Final   Nitrite, UA 41/32/4401 Negative  Negative Final   Leukocytes, UA 03/18/2023 Negative  Negative Final    Diabetic Foot Exam - Simple   No data filed      Lab Results  Component Value Date   WBC 6.0 02/06/2023   HGB 14.3 02/06/2023   HCT 42.4 02/06/2023   PLT 284 02/06/2023   GLUCOSE 89 02/06/2023   ALT 18 02/06/2023   AST 21 02/06/2023   NA 140 02/06/2023   K 3.8 02/06/2023   CL 102 02/06/2023   CREATININE 0.71 02/06/2023   BUN 7 02/06/2023   CO2 23 02/06/2023   TSH 1.050 09/10/2022   INR 1.0 11/22/2020   HGBA1C 5.4 01/02/2021      Assessment & Plan:   Problem List Items Addressed This Visit       Digestive    Gastroesophageal reflux disease without esophagitis Continue meds     Other   Other fatigue   Relevant Orders   CBC with Differential/Platelet   Comprehensive metabolic panel   TSH   Fibromyalgia - Primary Continue meds Follow up with Dr Dimple Casey as directed  History kidney stone Ua  Family history of elevated lipids Lipid panel pending              .  No orders of the defined types were placed in this encounter.   Orders Placed This Encounter  Procedures   Comprehensive metabolic panel   CBC with Differential/Platelet   TSH   VITAMIN D 25 Hydroxy (Vit-D Deficiency, Fractures)   Lipid panel   POCT URINALYSIS DIP (CLINITEK)     Follow-up: Return in about 6 months (around 09/17/2023) for chronic fasting follow up.  An After Visit Summary was printed and given to the patient.  Jettie Pagan [facility] [phone]

## 2023-03-19 LAB — LIPID PANEL
Chol/HDL Ratio: 2.9 ratio (ref 0.0–4.4)
Cholesterol, Total: 220 mg/dL — ABNORMAL HIGH (ref 100–199)
HDL: 77 mg/dL (ref >39)
LDL Chol Calc (NIH): 128 mg/dL — ABNORMAL HIGH (ref 0–99)
Triglycerides: 87 mg/dL (ref 0–149)
VLDL Cholesterol Cal: 15 mg/dL (ref 5–40)

## 2023-03-19 LAB — CBC WITH DIFFERENTIAL/PLATELET
Basophils Absolute: 0.1 10*3/uL (ref 0.0–0.2)
Basos: 1 %
EOS (ABSOLUTE): 0.2 10*3/uL (ref 0.0–0.4)
Eos: 2 %
Hematocrit: 44.5 % (ref 34.0–46.6)
Hemoglobin: 14.9 g/dL (ref 11.1–15.9)
Immature Grans (Abs): 0 10*3/uL (ref 0.0–0.1)
Immature Granulocytes: 1 %
Lymphocytes Absolute: 1.5 10*3/uL (ref 0.7–3.1)
Lymphs: 22 %
MCH: 29.8 pg (ref 26.6–33.0)
MCHC: 33.5 g/dL (ref 31.5–35.7)
MCV: 89 fL (ref 79–97)
Monocytes Absolute: 0.4 10*3/uL (ref 0.1–0.9)
Monocytes: 6 %
Neutrophils Absolute: 4.6 10*3/uL (ref 1.4–7.0)
Neutrophils: 68 %
Platelets: 314 10*3/uL (ref 150–450)
RBC: 5 x10E6/uL (ref 3.77–5.28)
RDW: 13.3 % (ref 11.7–15.4)
WBC: 6.8 10*3/uL (ref 3.4–10.8)

## 2023-03-19 LAB — COMPREHENSIVE METABOLIC PANEL
ALT: 15 IU/L (ref 0–32)
AST: 22 IU/L (ref 0–40)
Albumin/Globulin Ratio: 1.5 (ref 1.2–2.2)
Albumin: 4.3 g/dL (ref 3.9–4.9)
Alkaline Phosphatase: 87 IU/L (ref 44–121)
BUN/Creatinine Ratio: 10 (ref 9–23)
BUN: 7 mg/dL (ref 6–24)
Bilirubin Total: 0.3 mg/dL (ref 0.0–1.2)
CO2: 20 mmol/L (ref 20–29)
Calcium: 10.2 mg/dL (ref 8.7–10.2)
Chloride: 104 mmol/L (ref 96–106)
Creatinine, Ser: 0.68 mg/dL (ref 0.57–1.00)
Globulin, Total: 2.8 g/dL (ref 1.5–4.5)
Glucose: 68 mg/dL — ABNORMAL LOW (ref 70–99)
Potassium: 4.9 mmol/L (ref 3.5–5.2)
Sodium: 140 mmol/L (ref 134–144)
Total Protein: 7.1 g/dL (ref 6.0–8.5)
eGFR: 109 mL/min/{1.73_m2} (ref >59)

## 2023-03-19 LAB — TSH: TSH: 2.07 u[IU]/mL (ref 0.450–4.500)

## 2023-03-19 LAB — VITAMIN D 25 HYDROXY (VIT D DEFICIENCY, FRACTURES): Vit D, 25-Hydroxy: 28.5 ng/mL — ABNORMAL LOW (ref 30.0–100.0)

## 2023-03-19 LAB — CARDIOVASCULAR RISK ASSESSMENT

## 2023-04-29 ENCOUNTER — Telehealth: Payer: Self-pay

## 2023-04-29 NOTE — Telephone Encounter (Signed)
Pt called today to request a same day appointment for the following symptoms: sxs started Saturday: coughing, HA, ear ache, and runny nose. chills/body aches started today. Unfortunately, our schedule is full and we have no openings today. Pt was notified to go to Urgent Care or if they have MyChart they are able to login to do a e-visit with a [facility] Provider from home.

## 2023-05-04 ENCOUNTER — Other Ambulatory Visit: Payer: Self-pay | Admitting: Physician Assistant

## 2023-05-04 DIAGNOSIS — K219 Gastro-esophageal reflux disease without esophagitis: Secondary | ICD-10-CM

## 2023-05-15 ENCOUNTER — Ambulatory Visit: Payer: BC Managed Care – PPO | Admitting: Neurology

## 2023-05-15 ENCOUNTER — Encounter: Payer: Self-pay | Admitting: Neurology

## 2023-05-15 VITALS — BP 108/68 | HR 77 | Ht 65.0 in | Wt 134.4 lb

## 2023-05-15 DIAGNOSIS — G5603 Carpal tunnel syndrome, bilateral upper limbs: Secondary | ICD-10-CM

## 2023-05-15 NOTE — Patient Instructions (Signed)
Monday 10:45 emg/ncs  Electromyoneurogram Electromyoneurogram is a test to check how well your muscles and nerves are working. This procedure includes the combined use of electromyogram (EMG) and nerve conduction study (NCS). EMG is used to evaluate muscles and the nerves that control those muscles. NCS, which is also called electroneurogram, measures how well your nerves conduct electricity. The procedures should be done together to check if your muscles and nerves are healthy. If the results of the tests are abnormal, this may indicate disease or injury, such as a neuromuscular disease or peripheral nerve damage. Tell a health care provider about: Any allergies you have. All medicines you are taking, including vitamins, herbs, eye drops, creams, and over-the-counter medicines. Any bleeding problems you have. Any surgeries you have had. Any medical conditions you have. What are the risks? Generally, this is a safe procedure. However, problems may occur, including: Bleeding or bruising. Infection where the electrodes were inserted. What happens before the test? Medicines Take all of your usually prescribed medications before this testing is performed. Do not stop your blood thinners unless advised by your prescribing physician. General instructions Your health care provider may ask you to warm the limb that will be checked with warm water, hot pack, or wrapping the limb in a blanket. Do not use lotions or creams on the same day that you will be having the procedure. What happens during the test? For EMG  Your health care provider will ask you to stay in a position so that the muscle being studied can be accessed. You will be sitting or lying down. You may be given a medicine to numb the area (local anesthetic) and the skin will be disinfected. A very thin needle that has an electrode will be inserted into your muscle, one muscle at a time. Typically, multiple muscles are evaluated during a  single study. Another small electrode will be placed on your skin near the muscle. Your health care provider will ask you to continue to remain still. The electrodes will record the electrical activity of your muscles. You may see this on a monitor or hear it in the room. After your muscles have been studied at rest, your health care provider will ask you to contract or flex your muscles. The electrodes will record the electrical activity of your muscles. Your health care provider will remove the electrodes and the electrode needle when the procedure is finished. The procedure may vary among health care providers and hospitals. For NCS  An electrode that records your nerve activity (recording electrode) will be placed on your skin by the muscle that is being studied. An electrode that is used as a reference (reference electrode) will be placed near the recording electrode. A paste or gel will be applied to your skin between the recording electrode and the reference electrode. Your nerve will be stimulated with a mild shock. The speed of the nerves and strength of response is recorded by the electrodes. Your health care provider will remove the electrodes and the gel when the procedure is finished. The procedure may vary among health care providers and hospitals. What can I expect after the test? It is up to you to get your test results. Ask your health care provider, or the department that is doing the test, when your results will be ready. Your health care provider may: Give you medicines for any pain. Monitor the insertion sites to make sure that bleeding stops. You should be able to drive yourself to and from  the test. Discomfort can persist for a few hours after the test, but should be better the next day. Contact a health care provider if: You have swelling, redness, or drainage at any of the insertion sites. Summary Electromyoneurogram is a test to check how well your muscles and  nerves are working. If the results of the tests are abnormal, this may indicate disease or injury. This is a safe procedure. However, problems may occur, such as bleeding and infection. Your health care provider will do two tests to complete this procedure. One checks your muscles (EMG) and another checks your nerves (NCS). It is up to you to get your test results. Ask your health care provider, or the department that is doing the test, when your results will be ready. This information is not intended to replace advice given to you by your health care provider. Make sure you discuss any questions you have with your health care provider. Document Revised: 07/26/2021 Document Reviewed: 06/25/2021 Elsevier Patient Education  2024 [employer].

## 2023-05-15 NOTE — Progress Notes (Signed)
[facility]    Provider:  Dr Lucia Gaskins Requesting Provider: Fuller Plan, MD Primary Care Provider:  Marianne Sofia, PA-C  CC:  hand pain  HPI:  Sarah [name] is a 47 y.o. female here as requested by Fuller Plan, MD for hand pain and emg/ncs. has Weight loss; Hyperglycemia; Polyuria; Hypercalcemia; Malaise; Need for tetanus, diphtheria, and acellular pertussis (Tdap) vaccine; Palpitations; Left breast mass; Scoliosis; Other fatigue; Leg cramps; Frequency of urination; Irregular menses; Impingement syndrome of right shoulder; Labral tear of shoulder, degenerative, right; Carpal tunnel syndrome, bilateral; DDD (degenerative disc disease), cervical; Positive ANA (antinuclear antibody); Fibromyalgia; Facial rash; Vitamin D deficiency; Pain in right hip; Screening for colon cancer; Gastroesophageal reflux disease without esophagitis; Needs flu shot; Flank pain; and Family history of elevated blood lipids on their problem list.   She started pains 09/2022, multiple shots in the wrists, not helping, numbness in the whole hand, sometimes worse in the mornngs, hands goes numb and pain in the wrists. Sharp pains in hands. Strength is poor. Dropping a lot things. Worse in certain positions. She feels symmetrical. Right one gives her more problems because she uses it. She has cramps. She has neck pain. She has sharp pains in the legs and has had extensiv workup. Had MRI c-spine. She was diagnosed with Fibromyalgia and she sees Rheumatology for her other symptoms. Progressing. Nothing makes it better even the braces do not help. Numbness in digits 1-4 not 5th digit. No other associated symptoms, inciting events or modifiable factors.   Reviewed notes, labs and imaging from outside physicians, which showed :  Mri c-spine 11-2022:CLINICAL DATA: Initial evaluation for neck pain.  EXAM: MRI CERVICAL SPINE WITHOUT CONTRAST  TECHNIQUE: Multiplanar, multisequence MR imaging of the  cervical spine was performed. No intravenous contrast was administered.  COMPARISON: Prior study from 07/01/2020.  FINDINGS: Alignment: Mild straightening of the normal cervical lordosis. Trace degenerative retrolisthesis of a C3 on C4.  Vertebrae: Patient status post ACDF at C4-C6. Arthrodesis of the status by MRI. Vertebral body height maintained without acute or chronic fracture. Bone marrow signal intensity within normal limits. No discrete or worrisome osseous lesions or abnormal marrow edema.  Cord: Normal signal and morphology.  Posterior Fossa, vertebral arteries, paraspinal tissues: Unremarkable.  Disc levels:  C2-C3: Unremarkable.  C3-C4: Trace retrolisthesis. Mild disc bulge with uncovertebral spurring. No canal or foraminal stenosis.  C4-C5: Prior fusion. No residual canal or foraminal stenosis.  C5-C6: Prior fusion. No residual spinal stenosis. Foramina are adequately patent.  C6-C7: Mild disc bulge with uncovertebral spurring. No canal or foraminal stenosis.  C7-T1: Unremarkable.  IMPRESSION: 1. Prior ACDF at C4-C6 without residual or recurrent stenosis. 2. Mild noncompressive disc bulging at C3-4 and C6-7 without stenosis or neural impingement.   Recent Results (from the past 2160 hour(s))  POCT URINALYSIS DIP (CLINITEK)     Status: None   Collection Time: 03/18/23  8:15 AM  Result Value Ref Range   Color, UA yellow yellow   Clarity, UA clear clear   Glucose, UA negative negative mg/dL   Bilirubin, UA negative negative   Ketones, POC UA negative negative mg/dL   Spec Grav, UA 1.610 9.604 - 1.025   Blood, UA negative negative   pH, UA 6.0 5.0 - 8.0   POC PROTEIN,UA negative negative, trace   Urobilinogen, UA 0.2 0.2 or 1.0 E.U./dL   Nitrite, UA Negative Negative   Leukocytes, UA Negative Negative  Comprehensive metabolic panel     Status: Abnormal  Collection Time: 03/18/23  8:43 AM  Result Value Ref Range   Glucose 68 (L) 70 - 99 mg/dL    BUN 7 6 - 24 mg/dL   Creatinine, Ser 1.61 0.57 - 1.00 mg/dL   eGFR 096 >04 VW/UJW/1.19   BUN/Creatinine Ratio 10 9 - 23   Sodium 140 134 - 144 mmol/L   Potassium 4.9 3.5 - 5.2 mmol/L   Chloride 104 96 - 106 mmol/L   CO2 20 20 - 29 mmol/L   Calcium 10.2 8.7 - 10.2 mg/dL   Total Protein 7.1 6.0 - 8.5 g/dL   Albumin 4.3 3.9 - 4.9 g/dL   Globulin, Total 2.8 1.5 - 4.5 g/dL   Albumin/Globulin Ratio 1.5 1.2 - 2.2   Bilirubin Total 0.3 0.0 - 1.2 mg/dL   Alkaline Phosphatase 87 44 - 121 IU/L   AST 22 0 - 40 IU/L   ALT 15 0 - 32 IU/L  CBC with Differential/Platelet     Status: None   Collection Time: 03/18/23  8:43 AM  Result Value Ref Range   WBC 6.8 3.4 - 10.8 x10E3/uL   RBC 5.00 3.77 - 5.28 x10E6/uL   Hemoglobin 14.9 11.1 - 15.9 g/dL   Hematocrit 14.7 82.9 - 46.6 %   MCV 89 79 - 97 fL   MCH 29.8 26.6 - 33.0 pg   MCHC 33.5 31.5 - 35.7 g/dL   RDW 56.2 13.0 - 86.5 %   Platelets 314 150 - 450 x10E3/uL   Neutrophils 68 Not Estab. %   Lymphs 22 Not Estab. %   Monocytes 6 Not Estab. %   Eos 2 Not Estab. %   Basos 1 Not Estab. %   Neutrophils Absolute 4.6 1.4 - 7.0 x10E3/uL   Lymphocytes Absolute 1.5 0.7 - 3.1 x10E3/uL   Monocytes Absolute 0.4 0.1 - 0.9 x10E3/uL   EOS (ABSOLUTE) 0.2 0.0 - 0.4 x10E3/uL   Basophils Absolute 0.1 0.0 - 0.2 x10E3/uL   Immature Granulocytes 1 Not Estab. %   Immature Grans (Abs) 0.0 0.0 - 0.1 x10E3/uL  TSH     Status: None   Collection Time: 03/18/23  8:43 AM  Result Value Ref Range   TSH 2.070 0.450 - 4.500 uIU/mL  VITAMIN D 25 Hydroxy (Vit-D Deficiency, Fractures)     Status: Abnormal   Collection Time: 03/18/23  8:43 AM  Result Value Ref Range   Vit D, 25-Hydroxy 28.5 (L) 30.0 - 100.0 ng/mL    Comment: Vitamin D deficiency has been defined by the Institute of Medicine and an Endocrine Society practice guideline as a level of serum 25-OH vitamin D less than 20 ng/mL (1,2). The Endocrine Society went on to further define vitamin D insufficiency as a  level between 21 and 29 ng/mL (2). 1. IOM (Institute of Medicine). 2010. Dietary reference    intakes for calcium and D. [place]: The    [employer]. 2. Holick MF, Binkley Isabel, Bischoff-Ferrari HA, et al.    Evaluation, treatment, and prevention of vitamin D    deficiency: an Endocrine Society clinical practice    guideline. JCEM. 2011 Jul; 96(7):1911-30.   Lipid panel     Status: Abnormal   Collection Time: 03/18/23  8:43 AM  Result Value Ref Range   Cholesterol, Total 220 (H) 100 - 199 mg/dL   Triglycerides 87 0 - 149 mg/dL   HDL 77 >78 mg/dL   VLDL Cholesterol Cal 15 5 - 40 mg/dL   LDL Chol Calc (NIH) 469 (  H) 0 - 99 mg/dL   Chol/HDL Ratio 2.9 0.0 - 4.4 ratio    Comment:                                   T. Chol/HDL Ratio                                             Men  Women                               1/2 Avg.Risk  3.4    3.3                                   Avg.Risk  5.0    4.4                                2X Avg.Risk  9.6    7.1                                3X Avg.Risk 23.4   11.0   Cardiovascular Risk Assessment     Status: None   Collection Time: 03/18/23  8:43 AM  Result Value Ref Range   Interpretation Note     Comment: Supplemental report is available.     Review of Systems: Patient complains of symptoms per HPI as well as the following symptoms muscle pain. Pertinent negatives and positives per HPI. All others negative.   Social History   Socioeconomic History   Marital status: Single    Spouse name: Not on file   Number of children: Not on file   Years of education: Not on file   Highest education level: Not on file  Occupational History   Not on file  Tobacco Use   Smoking status: Every Day    Packs/day: 1.00    Years: 10.00    Additional pack years: 0.00    Total pack years: 10.00    Types: Cigarettes    Passive exposure: Never   Smokeless tobacco: Never  Vaping Use   Vaping Use: Never used  Substance and Sexual  Activity   Alcohol use: Not Currently   Drug use: Never   Sexual activity: Not Currently  Other Topics Concern   Not on file  Social History Narrative   Not on file   Social Determinants of Health   Financial Resource Strain: Not on file  Food Insecurity: Not on file  Transportation Needs: Not on file  Physical Activity: Not on file  Stress: Not on file  Social Connections: Not on file  Intimate Partner Violence: Not on file    Family History  Problem Relation Age of Onset   Heart disease Mother    Hyperlipidemia Mother    Anxiety disorder Sister    GER disease Son    Asthma Son    GER disease Son    Heart disease Son     Past Medical History:  Diagnosis Date   Carpal tunnel syndrome    Hernia, hiatal    Hypercalcemia  01/06/2021   Left breast mass 01/26/2021   Liver cyst    Malaise 01/06/2021   Need for tetanus, diphtheria, and acellular pertussis (Tdap) vaccine 01/06/2021   Palpitations 01/26/2021   Polyuria 01/06/2021   Scoliosis    Weight loss 01/02/2021    Patient Active Problem List   Diagnosis Date Noted   Flank pain 03/18/2023   Family history of elevated blood lipids 03/18/2023   Screening for colon cancer 09/10/2022   Gastroesophageal reflux disease without esophagitis 09/10/2022   Needs flu shot 09/10/2022   Vitamin D deficiency 08/13/2022   Pain in right hip 08/13/2022   Facial rash 02/08/2022   Fibromyalgia 11/09/2021   Positive ANA (antinuclear antibody) 10/12/2021   Impingement syndrome of right shoulder 07/07/2021   Labral tear of shoulder, degenerative, right 07/07/2021   Carpal tunnel syndrome, bilateral 07/07/2021   DDD (degenerative disc disease), cervical 07/07/2021   Other fatigue 05/08/2021   Leg cramps 05/08/2021   Frequency of urination 05/08/2021   Irregular menses 05/08/2021   Scoliosis    Palpitations 01/26/2021   Left breast mass 01/26/2021   Polyuria 01/06/2021   Hypercalcemia 01/06/2021   Malaise 01/06/2021   Need  for tetanus, diphtheria, and acellular pertussis (Tdap) vaccine 01/06/2021   Weight loss 01/02/2021   Hyperglycemia 01/02/2021    Past Surgical History:  Procedure Laterality Date   CERVICAL SPINE SURGERY     Left fooscrew and plate Z6X      TUBAL LIGATION      Current Outpatient Medications  Medication Sig Dispense Refill   celecoxib (CELEBREX) 200 MG capsule TAKE 1 CAPSULE BY MOUTH TWICE A DAY 30 capsule 2   Cholecalciferol (VITAMIN D) 50 MCG (2000 UT) CAPS Take by mouth.     dicyclomine (BENTYL) 10 MG capsule Take 10 mg by mouth every 6 (six) hours as needed.     DULoxetine (CYMBALTA) 60 MG capsule Take 1 capsule (60 mg total) by mouth daily. 90 capsule 1   famotidine (PEPCID) 40 MG tablet Take 40 mg by mouth 2 (two) times daily.     ondansetron (ZOFRAN) 4 MG tablet Take 1 tablet (4 mg total) by mouth every 8 (eight) hours as needed for nausea or vomiting. 20 tablet 0   pantoprazole (PROTONIX) 40 MG tablet TAKE 1 TABLET BY MOUTH EVERY DAY 90 tablet 1   No current facility-administered medications for this visit.    Allergies as of 05/15/2023 - Review Complete 05/15/2023  Allergen Reaction Noted   Meloxicam Other (See Comments) 11/22/2020    Vitals: BP 108/68   Pulse 77   Ht 5\' 5"  (1.651 m)   Wt 134 lb 6.4 oz (61 kg)   BMI 22.37 kg/m  Last Weight:  Wt Readings from Last 1 Encounters:  05/15/23 134 lb 6.4 oz (61 kg)   Last Height:   Ht Readings from Last 1 Encounters:  05/15/23 5\' 5"  (1.651 m)     Physical exam: Exam: Gen: NAD, conversant, well nourised,  well groomed                     CV: RRR, no MRG. No Carotid Bruits. No peripheral edema, warm, nontender Eyes: Conjunctivae clear without exudates or hemorrhage  Neuro: Detailed Neurologic Exam  Speech:    Speech is normal; fluent and spontaneous with normal comprehension.  Cognition:    The patient is oriented to person, place, and time;     recent and remote memory intact;     language  fluent;      normal attention, concentration,     fund of knowledge Cranial Nerves:    The pupils are equal, round, and reactive to light. Attempted, could not visualize fundi. Visual fields are full to finger confrontation. Extraocular movements are intact. Trigeminal sensation is intact and the muscles of mastication are normal. The face is symmetric. The palate elevates in the midline. Hearing intact. Voice is normal. Shoulder shrug is normal. The tongue has normal motion without fasciculations.   Coordination:    Normal finger to nose and heel to shin.   Gait: nml  Motor Observation:    No asymmetry, no atrophy, and no involuntary movements noted. Tone:    Normal muscle tone.    Posture:    Posture is normal. normal erect    Strength:    Strength is symmetric in the upper and lower limbs. Difficult exam poor effort and pain. Weak grip. Weak opponens.      Sensation: numbness in the hands in a median distribution (not in ulnar)     Reflex Exam:  DTR's:    Deep tendon reflexes in the upper and lower extremities are normal bilaterally.   Toes:    The toes are downgoing bilaterally.   Clonus:    Clonus is absent.  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 We will get herin Mondayfor emg/ncs. MRI c-spine without etiology 11/2022.   Orders Placed This Encounter  Procedures   NCV with EMG(electromyography)    Cc: Fuller Plan, MD,  Marianne Sofia, PA-C  Naomie Dean, MD  [facility] [address] [place], [place] [address]  Phone [phone] Fax [phone]

## 2023-05-20 ENCOUNTER — Ambulatory Visit (INDEPENDENT_AMBULATORY_CARE_PROVIDER_SITE_OTHER): Payer: Self-pay | Admitting: Neurology

## 2023-05-20 ENCOUNTER — Ambulatory Visit: Payer: BC Managed Care – PPO | Admitting: Neurology

## 2023-05-20 VITALS — Ht 65.0 in | Wt 134.0 lb

## 2023-05-20 DIAGNOSIS — G5603 Carpal tunnel syndrome, bilateral upper limbs: Secondary | ICD-10-CM | POA: Diagnosis not present

## 2023-05-20 DIAGNOSIS — Z0289 Encounter for other administrative examinations: Secondary | ICD-10-CM

## 2023-05-20 NOTE — Patient Instructions (Addendum)
[facility] [place].get test from [facility]

## 2023-05-20 NOTE — Progress Notes (Unsigned)
      Full Name: Mark Chandran Gender: Female MRN #: [id number] Date of Birth: 12/08/1975    Visit Date: 05/20/2023 10:52 Age: 47 Years Examining Physician: Dr.   Referring Physician: Dr.   Height: 5 feet 5 inch  History: Evaluation for carpal tunnel syndrome. Already had ultrasound and 2 injections. Looking for surgical options. Summary:    The right median/ulnar (palm) comparison nerve showed prolonged distal peak latency (Median Palm, 2.4 ms, N<2.2) and abnormal peak latency difference (Median Palm-Ulnar Palm, 0.6 ms, N<0.4) with a relative median delay.    The left median/ulnar (palm) comparison nerve showed prolonged distal peak latency (Median Palm, 2.3 ms, N<2.2) and abnormal peak latency difference (Median Palm-Ulnar Palm, 0.4 ms, N<0.4) with a relative median delay.    All remaining nerves (as indicated in the following tables) were within normal limits.   All muscles (as indicated in the following tables) were within normal limits.      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 Already had ultrasound and 2 injections. Looking for surgical options. Will send her to the [facility].    -------------------------------  , M.D.  [facility] [address], [address] [place], [place] [address] Tel: [phone] Fax: [phone]  Verbal informed consent was obtained from the patient, patient was informed of potential risk of procedure, including bruising, bleeding, hematoma formation, infection, muscle weakness, muscle pain, numbness, among others.        MNC    Nerve / Sites Muscle Latency Ref. Amplitude Ref. Rel Amp Segments Distance Velocity Ref. Area    ms ms mV mV %  cm m/s m/s mVms  R Median - APB     Wrist APB 3.2 ?4.4 8.2 ?4.0 100 Wrist - APB 7   26.3     Upper arm APB 7.9  8.3  101 Upper arm - Wrist 25.6 55 ?49 26.6  L Median - APB     Wrist APB 3.6 ?4.4 5.2 ?4.0 100  Wrist - APB 7   15.0     Upper arm APB 7.9  7.9  153 Upper arm - Wrist 27 64 ?49 23.9  R Ulnar - ADM     Wrist ADM 2.2 ?3.3 6.6 ?6.0 100 Wrist - ADM 7   28.0     B.Elbow ADM 3.7  5.4  81.7 B.Elbow - Wrist 11 74 ?49 21.9     A.Elbow ADM 6.6  6.6  121 A.Elbow - B.Elbow 20.6 71 ?49 27.2  L Ulnar - ADM     Wrist ADM 2.4 ?3.3 6.9 ?6.0 100 Wrist - ADM 7   22.5     B.Elbow ADM 4.2  6.5  94.9 B.Elbow - Wrist 11 63 ?49 19.9     A.Elbow ADM 7.2  7.0  108 A.Elbow - B.Elbow 19 63 ?49 21.6             SNC    Nerve / Sites Rec. Site Peak Lat Ref.  Amp Ref. Segments Distance Peak Diff Ref.    ms ms V V  cm ms ms  R Median, Ulnar - Transcarpal comparison     Median Palm Wrist 2.4 ?2.2 53 ?35 Median Palm - Wrist 8       Ulnar Palm Wrist 1.8 ?2.2 38 ?12 Ulnar Palm - Wrist 8          Median Palm - Ulnar Palm  0.6 ?0.4  L Median, Ulnar - Transcarpal comparison       Median Palm Wrist 2.3 ?2.2 59 ?35 Median Palm - Wrist 8       Ulnar Palm Wrist 1.9 ?2.2 23 ?12 Ulnar Palm - Wrist 8          Median Palm - Ulnar Palm  0.4 ?0.4  R Median - Orthodromic (Dig II, Mid palm)     Dig II Wrist 3.2 ?3.4 14 ?10 Dig II - Wrist 13    L Median - Orthodromic (Dig II, Mid palm)     Dig II Wrist 3.4 ?3.4 11 ?10 Dig II - Wrist 13    R Ulnar - Orthodromic, (Dig V, Mid palm)     Dig V Wrist 2.5 ?3.1 7 ?5 Dig V - Wrist 11    L Ulnar - Orthodromic, (Dig V, Mid palm)     Dig V Wrist 2.9 ?3.1 9 ?5 Dig V - Wrist 11                   F  Wave    Nerve F Lat Ref.   ms ms  R Ulnar - ADM 25.4 ?32.0  L Ulnar - ADM 25.2 ?32.0         EMG Summary Table    Spontaneous MUAP Recruitment  Muscle IA Fib PSW Fasc Other Amp Dur. Poly Pattern  R. Deltoid Normal None None None _______ Normal Normal Normal Normal  R. Cervical paraspinals (low) Normal None None None _______ Normal Normal Normal Normal  R. Triceps brachii Normal None None None _______ Normal Normal Normal Normal  R. Pronator teres Normal None None None _______ Normal  Normal Normal Normal  R. First dorsal interosseous Normal None None None _______ Normal Normal Normal Normal  R. Opponens pollicis Normal None None None _______ Normal Normal Normal Normal     

## 2023-05-21 NOTE — Procedures (Signed)
Full Name: Sarah [name] Gender: Female MRN #: [id number] Date of Birth: [birth year]    Visit Date: 05/20/2023 10:52 Age: 47 Years Examining Physician: Dr. Naomie Dean Referring Physician: Dr. Naomie Dean Height: 5 feet 5 inch  History: Evaluation for carpal tunnel syndrome. Already had ultrasound and 2 injections. Looking for surgical options. Summary:    The right median/ulnar (palm) comparison nerve showed prolonged distal peak latency (Median Palm, 2.4 ms, N<2.2) and abnormal peak latency difference (Median Palm-Ulnar Palm, 0.6 ms, N<0.4) with a relative median delay.    The left median/ulnar (palm) comparison nerve showed prolonged distal peak latency (Median Palm, 2.3 ms, N<2.2) and abnormal peak latency difference (Median Palm-Ulnar Palm, 0.4 ms, N<0.4) with a relative median delay.    All remaining nerves (as indicated in the following tables) were within normal limits.   All muscles (as indicated in the following tables) were within normal limits.      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 Already had ultrasound and 2 injections. Looking for surgical options. Will send her to the [facility].    ------------------------------- Naomie Dean, M.D.  [facility] [address], [address] [place], [place] [address] Tel: [phone] Fax: [phone]  Verbal informed consent was obtained from the patient, patient was informed of potential risk of procedure, including bruising, bleeding, hematoma formation, infection, muscle weakness, muscle pain, numbness, among others.        MNC    Nerve / Sites Muscle Latency Ref. Amplitude Ref. Rel Amp Segments Distance Velocity Ref. Area    ms ms mV mV %  cm m/s m/s mVms  R Median - APB     Wrist APB 3.2 ?4.4 8.2 ?4.0 100 Wrist - APB 7   26.3     Upper arm APB 7.9  8.3  101 Upper arm - Wrist 25.6 55 ?49 26.6  L Median - APB     Wrist APB 3.6 ?4.4 5.2 ?4.0 100  Wrist - APB 7   15.0     Upper arm APB 7.9  7.9  153 Upper arm - Wrist 27 64 ?49 23.9  R Ulnar - ADM     Wrist ADM 2.2 ?3.3 6.6 ?6.0 100 Wrist - ADM 7   28.0     B.Elbow ADM 3.7  5.4  81.7 B.Elbow - Wrist 11 74 ?49 21.9     A.Elbow ADM 6.6  6.6  121 A.Elbow - B.Elbow 20.6 71 ?49 27.2  L Ulnar - ADM     Wrist ADM 2.4 ?3.3 6.9 ?6.0 100 Wrist - ADM 7   22.5     B.Elbow ADM 4.2  6.5  94.9 B.Elbow - Wrist 11 63 ?49 19.9     A.Elbow ADM 7.2  7.0  108 A.Elbow - B.Elbow 19 63 ?49 21.6             SNC    Nerve / Sites Rec. Site Peak Lat Ref.  Amp Ref. Segments Distance Peak Diff Ref.    ms ms V V  cm ms ms  R Median, Ulnar - Transcarpal comparison     Median Palm Wrist 2.4 ?2.2 53 ?35 Median Palm - Wrist 8       Ulnar Palm Wrist 1.8 ?2.2 38 ?12 Ulnar Palm - Wrist 8          Median Palm - Ulnar Palm  0.6 ?0.4  L Median, Ulnar - Transcarpal comparison  Median Palm Wrist 2.3 ?2.2 59 ?35 Median Palm - Wrist 8       Ulnar Palm Wrist 1.9 ?2.2 23 ?12 Ulnar Palm - Wrist 8          Median Palm - Ulnar Palm  0.4 ?0.4  R Median - Orthodromic (Dig II, Mid palm)     Dig II Wrist 3.2 ?3.4 14 ?10 Dig II - Wrist 13    L Median - Orthodromic (Dig II, Mid palm)     Dig II Wrist 3.4 ?3.4 11 ?10 Dig II - Wrist 13    R Ulnar - Orthodromic, (Dig V, Mid palm)     Dig V Wrist 2.5 ?3.1 7 ?5 Dig V - Wrist 11    L Ulnar - Orthodromic, (Dig V, Mid palm)     Dig V Wrist 2.9 ?3.1 9 ?5 Dig V - Wrist 29                   F  Wave    Nerve F Lat Ref.   ms ms  R Ulnar - ADM 25.4 ?32.0  L Ulnar - ADM 25.2 ?32.0         EMG Summary Table    Spontaneous MUAP Recruitment  Muscle IA Fib PSW Fasc Other Amp Dur. Poly Pattern  R. Deltoid Normal None None None _______ Normal Normal Normal Normal  R. Cervical paraspinals (low) Normal None None None _______ Normal Normal Normal Normal  R. Triceps brachii Normal None None None _______ Normal Normal Normal Normal  R. Pronator teres Normal None None None _______ Normal  Normal Normal Normal  R. First dorsal interosseous Normal None None None _______ Normal Normal Normal Normal  R. Opponens pollicis Normal None None None _______ Normal Normal Normal Normal

## 2023-05-22 ENCOUNTER — Telehealth: Payer: Self-pay | Admitting: Neurology

## 2023-05-22 NOTE — Telephone Encounter (Signed)
Referral faxed to [facility]: Phone: [phone]    Fax: [phone]

## 2023-06-03 ENCOUNTER — Encounter: Payer: Self-pay | Admitting: Internal Medicine

## 2023-06-03 ENCOUNTER — Encounter: Payer: Self-pay | Admitting: Physician Assistant

## 2023-06-04 NOTE — Telephone Encounter (Signed)
Patient was last seen on 02/11/2023. Please advise.

## 2023-06-04 NOTE — Telephone Encounter (Signed)
Attempted to contact the patient and could not leave a message because the mail box was full.  

## 2023-06-04 NOTE — Telephone Encounter (Signed)
Patient advised Dr. Dimple Casey thinks her next step will be the appointment with Dr. Merlyn Lot see if he recommends the surgery for carpal tunnel at this time. Our next follow up can be for September since right now just treating with the Cymbalta for joint and nerve pain. Patient verbalized understanding. Patient scheduled for follow up on 08/23/2023.

## 2023-06-04 NOTE — Telephone Encounter (Signed)
I think her next step will be the appointment with Dr. Merlyn Lot see if he recommends the surgery for carpal tunnel at this time. Our next follow up can be for September since right now just treating with the Cymbalta for joint and nerve pain.

## 2023-06-14 ENCOUNTER — Other Ambulatory Visit: Payer: Self-pay | Admitting: Orthopedic Surgery

## 2023-06-24 ENCOUNTER — Encounter (HOSPITAL_BASED_OUTPATIENT_CLINIC_OR_DEPARTMENT_OTHER): Payer: Self-pay

## 2023-06-24 ENCOUNTER — Other Ambulatory Visit: Payer: Self-pay

## 2023-06-24 ENCOUNTER — Encounter (HOSPITAL_BASED_OUTPATIENT_CLINIC_OR_DEPARTMENT_OTHER): Payer: Self-pay | Admitting: Orthopedic Surgery

## 2023-06-28 NOTE — Progress Notes (Signed)

## 2023-07-01 ENCOUNTER — Ambulatory Visit (HOSPITAL_BASED_OUTPATIENT_CLINIC_OR_DEPARTMENT_OTHER)
Admission: RE | Admit: 2023-07-01 | Discharge: 2023-07-01 | Disposition: A | Discharge status: Home / Self Care | Payer: BC Managed Care – PPO | Attending: Orthopedic Surgery | Admitting: Orthopedic Surgery

## 2023-07-01 ENCOUNTER — Other Ambulatory Visit: Payer: Self-pay

## 2023-07-01 ENCOUNTER — Encounter (HOSPITAL_BASED_OUTPATIENT_CLINIC_OR_DEPARTMENT_OTHER)
Admission: RE | Disposition: A | Discharge status: Home / Self Care | Payer: Self-pay | Source: Home / Self Care | Attending: Orthopedic Surgery

## 2023-07-01 ENCOUNTER — Ambulatory Visit (HOSPITAL_BASED_OUTPATIENT_CLINIC_OR_DEPARTMENT_OTHER): Payer: BC Managed Care – PPO | Admitting: Certified Registered Nurse Anesthetist

## 2023-07-01 ENCOUNTER — Encounter (HOSPITAL_BASED_OUTPATIENT_CLINIC_OR_DEPARTMENT_OTHER): Payer: Self-pay | Admitting: Orthopedic Surgery

## 2023-07-01 DIAGNOSIS — K219 Gastro-esophageal reflux disease without esophagitis: Secondary | ICD-10-CM | POA: Insufficient documentation

## 2023-07-01 DIAGNOSIS — F17210 Nicotine dependence, cigarettes, uncomplicated: Secondary | ICD-10-CM | POA: Diagnosis not present

## 2023-07-01 DIAGNOSIS — G5601 Carpal tunnel syndrome, right upper limb: Secondary | ICD-10-CM | POA: Insufficient documentation

## 2023-07-01 DIAGNOSIS — Z01818 Encounter for other preprocedural examination: Secondary | ICD-10-CM

## 2023-07-01 HISTORY — DX: Gastro-esophageal reflux disease without esophagitis: K21.9

## 2023-07-01 HISTORY — PX: CARPAL TUNNEL RELEASE: SHX101

## 2023-07-01 HISTORY — DX: Fibromyalgia: M79.7

## 2023-07-01 LAB — POCT PREGNANCY, URINE: Preg Test, Ur: NEGATIVE

## 2023-07-01 SURGERY — CARPAL TUNNEL RELEASE
Anesthesia: Monitor Anesthesia Care | Site: Hand | Laterality: Right

## 2023-07-01 MED ORDER — ONDANSETRON HCL 4 MG/2ML IJ SOLN: 4.0000 mg | Freq: Once | INTRAMUSCULAR | Status: DC | PRN

## 2023-07-01 MED ORDER — FENTANYL CITRATE (PF) 250 MCG/5ML IJ SOLN
INTRAMUSCULAR | Status: DC | PRN
  Administered 2023-07-01 (×2): 50 ug via INTRAVENOUS

## 2023-07-01 MED ORDER — ATROPINE SULFATE 0.4 MG/ML IV SOLN
INTRAVENOUS | Status: AC
  Filled 2023-07-01: qty 1

## 2023-07-01 MED ORDER — FENTANYL CITRATE (PF) 100 MCG/2ML IJ SOLN: 25.0000 ug | INTRAMUSCULAR | Status: DC | PRN

## 2023-07-01 MED ORDER — CEFAZOLIN SODIUM-DEXTROSE 2-4 GM/100ML-% IV SOLN
2.0000 g | INTRAVENOUS | Status: AC
  Administered 2023-07-01: 2 g via INTRAVENOUS

## 2023-07-01 MED ORDER — LIDOCAINE 2% (20 MG/ML) 5 ML SYRINGE
INTRAMUSCULAR | Status: AC
  Filled 2023-07-01: qty 10

## 2023-07-01 MED ORDER — LACTATED RINGERS IV SOLN: INTRAVENOUS | Status: DC

## 2023-07-01 MED ORDER — BUPIVACAINE HCL (PF) 0.25 % IJ SOLN
INTRAMUSCULAR | Status: DC | PRN
  Administered 2023-07-01: 9 mL

## 2023-07-01 MED ORDER — LIDOCAINE 2% (20 MG/ML) 5 ML SYRINGE
INTRAMUSCULAR | Status: DC | PRN
  Administered 2023-07-01: 100 mg via INTRAVENOUS

## 2023-07-01 MED ORDER — PROPOFOL 10 MG/ML IV BOLUS
INTRAVENOUS | Status: AC
  Filled 2023-07-01: qty 20

## 2023-07-01 MED ORDER — ONDANSETRON HCL 4 MG/2ML IJ SOLN
INTRAMUSCULAR | Status: DC | PRN
Start: 2023-07-01 — End: 2023-07-01
  Administered 2023-07-01: 4 mg via INTRAVENOUS

## 2023-07-01 MED ORDER — DEXAMETHASONE SODIUM PHOSPHATE 10 MG/ML IJ SOLN
INTRAMUSCULAR | Status: DC | PRN
  Administered 2023-07-01: 10 mg via INTRAVENOUS

## 2023-07-01 MED ORDER — FENTANYL CITRATE (PF) 100 MCG/2ML IJ SOLN
INTRAMUSCULAR | Status: AC
  Filled 2023-07-01: qty 2

## 2023-07-01 MED ORDER — CEFAZOLIN SODIUM-DEXTROSE 2-4 GM/100ML-% IV SOLN
INTRAVENOUS | Status: AC
  Filled 2023-07-01: qty 100

## 2023-07-01 MED ORDER — HYDROCODONE-ACETAMINOPHEN 5-325 MG PO TABS: ORAL_TABLET | ORAL | 0 refills | Status: DC

## 2023-07-01 MED ORDER — MIDAZOLAM HCL 2 MG/2ML IJ SOLN
INTRAMUSCULAR | Status: AC
  Filled 2023-07-01: qty 2

## 2023-07-01 MED ORDER — ONDANSETRON HCL 4 MG/2ML IJ SOLN
INTRAMUSCULAR | Status: AC
  Filled 2023-07-01: qty 2

## 2023-07-01 MED ORDER — OXYCODONE HCL 5 MG PO TABS: 5.0000 mg | ORAL_TABLET | Freq: Once | ORAL | Status: DC | PRN

## 2023-07-01 MED ORDER — MIDAZOLAM HCL 2 MG/2ML IJ SOLN
INTRAMUSCULAR | Status: DC | PRN
  Administered 2023-07-01: 2 mg via INTRAVENOUS

## 2023-07-01 MED ORDER — DEXAMETHASONE SODIUM PHOSPHATE 10 MG/ML IJ SOLN
INTRAMUSCULAR | Status: AC
  Filled 2023-07-01: qty 1

## 2023-07-01 MED ORDER — OXYCODONE HCL 5 MG/5ML PO SOLN: 5.0000 mg | Freq: Once | ORAL | Status: DC | PRN

## 2023-07-01 MED ORDER — PROPOFOL 10 MG/ML IV BOLUS
INTRAVENOUS | Status: DC | PRN
  Administered 2023-07-01: 200 mg via INTRAVENOUS

## 2023-07-01 MED ORDER — SUCCINYLCHOLINE CHLORIDE 200 MG/10ML IV SOSY
PREFILLED_SYRINGE | INTRAVENOUS | Status: AC
  Filled 2023-07-01: qty 10

## 2023-07-01 MED ORDER — PROPOFOL 500 MG/50ML IV EMUL
INTRAVENOUS | Status: DC | PRN
Start: 2023-07-01 — End: 2023-07-01
  Administered 2023-07-01: 50 ug/kg/min via INTRAVENOUS

## 2023-07-01 MED ORDER — KETOROLAC TROMETHAMINE 30 MG/ML IJ SOLN: 30.0000 mg | Freq: Once | INTRAMUSCULAR | Status: DC | PRN

## 2023-07-01 MED ORDER — 0.9 % SODIUM CHLORIDE (POUR BTL) OPTIME
TOPICAL | Status: DC | PRN
  Administered 2023-07-01: 1000 mL

## 2023-07-01 SURGICAL SUPPLY — 35 items
APL PRP STRL LF DISP 70% ISPRP (MISCELLANEOUS) ×1
BLADE SURG 15 STRL LF DISP TIS (BLADE) ×2 IMPLANT
BLADE SURG 15 STRL SS (BLADE) ×2
BNDG CMPR 5X3 KNIT ELC UNQ LF (GAUZE/BANDAGES/DRESSINGS) ×1
BNDG CMPR 9X4 STRL LF SNTH (GAUZE/BANDAGES/DRESSINGS)
BNDG ELASTIC 3INX 5YD STR LF (GAUZE/BANDAGES/DRESSINGS) ×1 IMPLANT
BNDG ESMARK 4X9 LF (GAUZE/BANDAGES/DRESSINGS) IMPLANT
BNDG GAUZE DERMACEA FLUFF 4 (GAUZE/BANDAGES/DRESSINGS) ×1 IMPLANT
BNDG GZE DERMACEA 4 6PLY (GAUZE/BANDAGES/DRESSINGS) ×1
CHLORAPREP W/TINT 26 (MISCELLANEOUS) ×1 IMPLANT
CORD BIPOLAR FORCEPS 12FT (ELECTRODE) ×1 IMPLANT
COVER BACK TABLE 60X90IN (DRAPES) ×1 IMPLANT
COVER MAYO STAND STRL (DRAPES) ×1 IMPLANT
CUFF TOURN SGL QUICK 18X4 (TOURNIQUET CUFF) ×1 IMPLANT
DRAPE EXTREMITY T 121X128X90 (DISPOSABLE) ×1 IMPLANT
DRAPE SURG 17X23 STRL (DRAPES) ×1 IMPLANT
GAUZE PAD ABD 8X10 STRL (GAUZE/BANDAGES/DRESSINGS) ×1 IMPLANT
GAUZE SPONGE 4X4 12PLY STRL (GAUZE/BANDAGES/DRESSINGS) ×1 IMPLANT
GAUZE XEROFORM 1X8 LF (GAUZE/BANDAGES/DRESSINGS) ×1 IMPLANT
GLOVE BIO SURGEON STRL SZ7.5 (GLOVE) ×1 IMPLANT
GLOVE BIOGEL PI IND STRL 8 (GLOVE) ×1 IMPLANT
GOWN STRL REUS W/ TWL LRG LVL3 (GOWN DISPOSABLE) ×1 IMPLANT
GOWN STRL REUS W/TWL LRG LVL3 (GOWN DISPOSABLE) ×1
GOWN STRL REUS W/TWL XL LVL3 (GOWN DISPOSABLE) ×1 IMPLANT
NDL HYPO 25X1 1.5 SAFETY (NEEDLE) ×1 IMPLANT
NEEDLE HYPO 25X1 1.5 SAFETY (NEEDLE) ×1 IMPLANT
NS IRRIG 1000ML POUR BTL (IV SOLUTION) ×1 IMPLANT
PACK BASIN DAY SURGERY FS (CUSTOM PROCEDURE TRAY) ×1 IMPLANT
PADDING CAST ABS COTTON 4X4 ST (CAST SUPPLIES) ×1 IMPLANT
STOCKINETTE 4X48 STRL (DRAPES) ×1 IMPLANT
SUT ETHILON 4 0 PS 2 18 (SUTURE) ×1 IMPLANT
SYR BULB EAR ULCER 3OZ GRN STR (SYRINGE) ×1 IMPLANT
SYR CONTROL 10ML LL (SYRINGE) ×1 IMPLANT
TOWEL GREEN STERILE FF (TOWEL DISPOSABLE) ×2 IMPLANT
UNDERPAD 30X36 HEAVY ABSORB (UNDERPADS AND DIAPERS) ×1 IMPLANT

## 2023-07-01 NOTE — Transfer of Care (Signed)
Immediate Anesthesia Transfer of Care Note  Patient: Sarah [name]  Procedure(s) Performed: right carpal tunnel release (Right: Hand)  Patient Location: PACU  Anesthesia Type:General  Level of Consciousness: drowsy and patient cooperative  Airway & Oxygen Therapy: Patient Spontanous Breathing and Patient connected to face mask oxygen  Post-op Assessment: Report given to RN and Post -op Vital signs reviewed and stable  Post vital signs: Reviewed and stable  Last Vitals:  Vitals Value Taken Time  BP 102/60 07/01/23 1352  Temp    Pulse 68 07/01/23 1353  Resp 13 07/01/23 1353  SpO2 97 % 07/01/23 1353  Vitals shown include unfiled device data.  Last Pain:  Vitals:   07/01/23 1141  TempSrc: Temporal  PainSc: 0-No pain      Patients Stated Pain Goal: 3 (07/01/23 1141)  Complications: No notable events documented.

## 2023-07-01 NOTE — Anesthesia Procedure Notes (Signed)
Procedure Name: LMA Insertion Date/Time: 07/01/2023 1:18 PM  Performed by: Alvera Novel, CRNAPre-anesthesia Checklist: Patient identified, Emergency Drugs available, Suction available and Patient being monitored Patient Re-evaluated:Patient Re-evaluated prior to induction Oxygen Delivery Method: Circle System Utilized Preoxygenation: Pre-oxygenation with 100% oxygen Induction Type: IV induction Ventilation: Mask ventilation without difficulty LMA: LMA inserted LMA Size: 4.0 Number of attempts: 1 Placement Confirmation: positive ETCO2 Tube secured with: Tape Dental Injury: Teeth and Oropharynx as per pre-operative assessment

## 2023-07-01 NOTE — Anesthesia Postprocedure Evaluation (Signed)
Anesthesia Post Note  Patient: Sarah [name]  Procedure(s) Performed: right carpal tunnel release (Right: Hand)     Patient location during evaluation: PACU Anesthesia Type: General Level of consciousness: awake and alert Pain management: pain level controlled Vital Signs Assessment: post-procedure vital signs reviewed and stable Respiratory status: spontaneous breathing, nonlabored ventilation, respiratory function stable and patient connected to nasal cannula oxygen Cardiovascular status: blood pressure returned to baseline and stable Postop Assessment: no apparent nausea or vomiting Anesthetic complications: no  No notable events documented.  Last Vitals:  Vitals:   07/01/23 1353 07/01/23 1400  BP:  104/67  Pulse: 69 71  Resp: 13 14  Temp:    SpO2: 98% 98%    Last Pain:  Vitals:   07/01/23 1400  TempSrc:   PainSc: Asleep                 , S

## 2023-07-01 NOTE — Anesthesia Preprocedure Evaluation (Signed)
Anesthesia Evaluation  Patient identified by MRN, date of birth, ID band Patient awake    Reviewed: Allergy & Precautions, H&P , NPO status , Patient's Chart, lab work & pertinent test results  Airway Mallampati: II  TM Distance: >3 FB Neck ROM: Full    Dental no notable dental hx.    Pulmonary Current Smoker   Pulmonary exam normal breath sounds clear to auscultation       Cardiovascular negative cardio ROS Normal cardiovascular exam Rhythm:Regular Rate:Normal     Neuro/Psych negative neurological ROS  negative psych ROS   GI/Hepatic Neg liver ROS,GERD  ,,  Endo/Other  negative endocrine ROS    Renal/GU negative Renal ROS  negative genitourinary   Musculoskeletal negative musculoskeletal ROS (+)    Abdominal   Peds negative pediatric ROS (+)  Hematology negative hematology ROS (+)   Anesthesia Other Findings   Reproductive/Obstetrics negative OB ROS                             Anesthesia Physical Anesthesia Plan  ASA: 2  Anesthesia Plan: MAC   Post-op Pain Management: Minimal or no pain anticipated   Induction: Intravenous  PONV Risk Score and Plan: 2 and Propofol infusion and Treatment may vary due to age or medical condition  Airway Management Planned: Simple Face Mask  Additional Equipment:   Intra-op Plan:   Post-operative Plan:   Informed Consent: I have reviewed the patients History and Physical, chart, labs and discussed the procedure including the risks, benefits and alternatives for the proposed anesthesia with the patient or authorized representative who has indicated his/her understanding and acceptance.     Dental advisory given  Plan Discussed with: CRNA and Surgeon  Anesthesia Plan Comments:        Anesthesia Quick Evaluation

## 2023-07-01 NOTE — Op Note (Signed)
07/01/2023 [facility]                              OPERATIVE REPORT   PREOPERATIVE DIAGNOSIS:  Right carpal tunnel syndrome.  POSTOPERATIVE DIAGNOSIS:  Right carpal tunnel syndrome.  PROCEDURE:  Right carpal tunnel release.  SURGEON:  Betha Loa, MD  ASSISTANT:  none.  ANESTHESIA: General  IV FLUIDS:  Per anesthesia flow sheet.  ESTIMATED BLOOD LOSS:  Minimal.  COMPLICATIONS:  None.  SPECIMENS:  None.  TOURNIQUET TIME:    Total Tourniquet Time Documented: Upper Arm (Right) - 14 minutes Total: Upper Arm (Right) - 14 minutes   DISPOSITION:  Stable to PACU.  LOCATION: [facility]  INDICATIONS:  47 y.o. yo female with numbness and tingling right hand.  Nocturnal symptoms. Positive nerve conduction studies. She wishes to proceed with right carpal tunnel release.  Risks, benefits and alternatives of surgery were discussed including the risk of blood loss; infection; damage to nerves, vessels, tendons, ligaments, bone; failure of surgery; need for additional surgery; complications with wound healing; continued pain; recurrence of carpal tunnel syndrome; and damage to motor branch. She voiced understanding of these risks and elected to proceed.   OPERATIVE COURSE:  After being identified preoperatively by myself, the patient and I agreed upon the procedure and site of procedure.  The surgical site was marked.  Surgical consent had been signed.  She was given IV Ancef as preoperative antibiotic prophylaxis.  She was transferred to the operating room and placed on the operating room table in supine position with the Right upper extremity on an armboard.  General anesthesia was induced by the anesthesiologist.  Right upper extremity was prepped and draped in normal sterile orthopaedic fashion.  A surgical pause was performed between the surgeons, anesthesia, and operating room staff, and all were in agreement as to the patient, procedure, and site of procedure.   Tourniquet at the proximal aspect of the extremity was inflated to 250 mmHg after exsanguination of the arm with an Esmarch bandage  Incision was made over the transverse carpal ligament and carried into the subcutaneous tissues by spreading technique.  Bipolar electrocautery was used to obtain hemostasis.  The palmar fascia was sharply incised.  The transverse carpal ligament was identified.  The fascia distal to the ligament was opened.  Retractor was placed and the flexor tendons were identified.  The flexor tendon to the ring finger was identified and retracted radially.  The transverse carpal ligament was then incised from distal to proximal under direct visualization.  Scissors were used to split the distal aspect of the volar antebrachial fascia.  A finger was placed into the wound to ensure complete decompression, which was the case.  The nerve was examined.  It was flattened and hyperemic.  The motor branch was identified and was intact.  The wound was copiously irrigated with sterile saline.  It was then closed with 4-0 nylon in a horizontal mattress fashion.  It was injected with 0.25% plain Marcaine to aid in postoperative analgesia.  It was dressed with sterile Xeroform, 4x4s, an ABD, and wrapped with Kerlix and an Ace bandage.  Tourniquet was deflated at 14 minutes.  Fingertips were pink with brisk capillary refill after deflation of the tourniquet.  Operative drapes were broken down.  The patient was awoken from anesthesia safely.  She was transferred back to stretcher and taken to the PACU in stable condition.  I will see her back in the office in 1 week for postoperative followup.  I will give her a prescription for Norco 5/325 1-2 tabs PO q6 hours prn pain, dispense # 15.    Betha Loa, MD Electronically signed, 07/01/23

## 2023-07-01 NOTE — H&P (Signed)
Sarah [name] is an 47 y.o. female.   Chief Complaint: carpal tunnel syndrome HPI:  47 y.o. yo female with numbness and tingling right hand.  Nocturnal symptoms. Positive nerve conduction studies. She wishes to have right carpal tunnel release.   Allergies:  Allergies  Allergen Reactions   Meloxicam Other (See Comments)    eyesight, patient can take ibuprofen and Nsaids fine.  Eyesight, , eyesight, patient can take ibuprofen and Nsaids fine.    Past Medical History:  Diagnosis Date   Carpal tunnel syndrome    Fibromyalgia    GERD (gastroesophageal reflux disease)    Hernia, hiatal    Hypercalcemia 01/06/2021   Left breast mass 01/26/2021   Liver cyst    Malaise 01/06/2021   Need for tetanus, diphtheria, and acellular pertussis (Tdap) vaccine 01/06/2021   Palpitations 01/26/2021   Polyuria 01/06/2021   Scoliosis    Weight loss 01/02/2021    Past Surgical History:  Procedure Laterality Date   CERVICAL SPINE SURGERY     Left fooscrew and plate Z6X      TUBAL LIGATION      Family History: Family History  Problem Relation Age of Onset   Heart disease Mother    Hyperlipidemia Mother    Anxiety disorder Sister    GER disease Son    Asthma Son    GER disease Son    Heart disease Son     Social History:   reports that she has been smoking cigarettes. She has a 10 pack-year smoking history. She has never been exposed to tobacco smoke. She has never used smokeless tobacco. She reports that she does not currently use alcohol. She reports that she does not use drugs.  Medications: Medications Prior to Admission  Medication Sig Dispense Refill   celecoxib (CELEBREX) 200 MG capsule TAKE 1 CAPSULE BY MOUTH TWICE A DAY 30 capsule 2   Cholecalciferol (VITAMIN D) 50 MCG (2000 UT) CAPS Take by mouth.     dicyclomine (BENTYL) 10 MG capsule Take 10 mg by mouth every 6 (six) hours as needed.     DULoxetine (CYMBALTA) 60 MG capsule Take 1 capsule (60 mg total) by mouth daily. 90  capsule 1   famotidine (PEPCID) 40 MG tablet Take 40 mg by mouth 2 (two) times daily.     pantoprazole (PROTONIX) 40 MG tablet TAKE 1 TABLET BY MOUTH EVERY DAY 90 tablet 1   ondansetron (ZOFRAN) 4 MG tablet Take 1 tablet (4 mg total) by mouth every 8 (eight) hours as needed for nausea or vomiting. 20 tablet 0    Results for orders placed or performed during the hospital encounter of 07/01/23 (from the past 48 hour(s))  Pregnancy, urine POC     Status: None   Collection Time: 07/01/23 11:30 AM  Result Value Ref Range   Preg Test, Ur NEGATIVE NEGATIVE    Comment:        THE SENSITIVITY OF THIS METHODOLOGY IS >24 mIU/mL     No results found.    Blood pressure 119/84, pulse 67, temperature 98.5 F (36.9 C), temperature source Temporal, resp. rate 12, height 5\' 5"  (1.651 m), weight 61.9 kg, last menstrual period 05/27/2023, SpO2 100%.  General appearance: alert, cooperative, and appears stated age Head: Normocephalic, without obvious abnormality, atraumatic Neck: supple, symmetrical, trachea midline Extremities: Intact capillary refill all digits.  +epl/fpl/io.  No wounds.  Pulses: 2+ and symmetric Skin: Skin color, texture, turgor normal. No rashes or lesions Neurologic: Grossly normal Incision/Wound:  none  Assessment/Plan Right carpal tunnel syndrome.  Non operative and operative treatment options have been discussed with the patient and patient wishes to proceed with operative treatment. Risks, benefits, and alternatives of surgery have been discussed and the patient agrees with the plan of care.   Betha Loa 07/01/2023, 1:02 PM

## 2023-07-01 NOTE — Discharge Instructions (Addendum)

## 2023-07-02 ENCOUNTER — Encounter (HOSPITAL_BASED_OUTPATIENT_CLINIC_OR_DEPARTMENT_OTHER): Payer: Self-pay | Admitting: Orthopedic Surgery

## 2023-07-29 ENCOUNTER — Other Ambulatory Visit: Payer: Self-pay | Admitting: Physician Assistant

## 2023-08-09 NOTE — Progress Notes (Signed)
Office Visit Note  Patient: Sarah [name]             Date of Birth: 08-Sep-1976           MRN: [id number]             PCP: Marianne Sofia, PA-C Referring: Marianne Sofia, PA-C Visit Date: 08/23/2023   Subjective:  Follow-up (Patient states her legs hurt a lot but since she has been out of work they do not hurt as bad. Patient states she has pain in her right hip and both knees. )   History of Present Illness: Sarah [name] is a 47 y.o. female here for follow up for carpal tunnel syndrome and fibromyalgia on cymbalta 60 mg and Celebrex 200 mg twice daily.  Since our last visit she saw Dr. Merlyn Lot and had right wrist carpal tunnel release surgery on August 5.  So far she is still working on her postoperative rehab exercises still has some stiffness and decreased extension range of motion.  She has been out of work since surgery for recovery and her day-to-day pain in hips and knees has mostly been better.  Currently experiencing an increase in knee pain worse on the right side with a small amount of swelling.  This has been coming and going lasting for days at a time and for several years.  Previous HPI 02/11/2023 Sarah [name] is a 47 y.o. female here for follow up for carpal tunnel syndrome and fibromyalgia on cymbalta 60 mg and recent wrist injections January. Recent GI illness diarrhea and not eating labs last Wednesday low albumin Thursday with low electrolytes. Getting more muscle cramping in both legs but not a full spasm or cramp like she has normally had in the past. Now eating well again since yesterday. Flexeril helps but only taking sometimes due to drowsiness.   Previous HPI 08/13/22 Sarah [name] is a 47 y.o. female here for follow up with joint pains and myofascial pain on cymbalta 60 mg daily and celebrex 200 mg daily or as needed. We saw her for bilateral wrist injections for CTS in June and she had good improvement but is starting to notice some tingling in fingertips again  recently. She notices some shakiness or tremor in her hands that might be increased. Neck pain and muscle soreness. She has some elbow pains and there is a rash on her left elbow just in the past week or two. She also is having pain around her right hip and radiating into the right leg sometimes often after standing from driving for 08-65 minutes.   Previous HPI 12/05/22 Patient presented today for bilateral ultrasound-guided carpal tunnel steroid injection due to progressively worsening hand pain and numbness again since November of last year.  Reviewed previous procedure which which we did in June after which she initially had complete improvement in symptoms before they started to return.  Tolerated the previous injections with no adverse effect.   Injections performed today under ultrasound guidance both sides.  Patient experienced a brief vagal episode which recovered after approximately 10 minutes waiting.  Provided printed range of motion exercises for carpal and cubital tunnel symptoms.   02/08/2022  Sarah [name] is a 47 y.o. female here for follow up with joint pains and myofascial pain after starting cymbalta and titrate to 60 mg and with celebrex 200 mg BID. She noticed an improvement in pain of both legs after increasing the cymbalta dose to 60 mg. She has intermittent  hand numbness continuing to come and go in both hands, bothering her a bit more than before. She reports one incident of using too hot water for her son with inability to feel temperature.   Previous HPI 11/09/21 Sarah [name] is a 47 y.o. female here for follow up with multiple symptoms including joint pains, fatigue, numbness, and unintentional weight loss with positive ANA serology. Labs at initial visit showing positive RNP 1.5 otherwise normal CK norma complements low ANA titer 1:40. She continues having ongoing symptoms with pain all over worst in legs at the moment and very fatigued.   Previous  HPI 10/12/21 Sarah [name] is a 47 y.o. female here for joint pains, numbness, fatigue, and weight loss with positive ANA testing.  Symptoms have really been ongoing since last year no specific onset or proceeding infections or medical events that she can recall.  Initially this was accompanied by a generalized body aches and fatigue as well as a progressive unexplained weight loss from her baseline down to as low as 105 pounds.  She has had some amount of chronic joint pain previous evaluations of bilateral rotator cuff arthropathy, mild degenerative disc disease in the lumbar spine, previous right leg fracture with residual swelling intermittently at the ankle and knee.  However these were increased additionally with problems of a lot of stiffness and pain in the morning and getting up from stationary positions particularly in bilateral hips. Particularly notices pain whenever she has to drive for an hour or longer at a time.  These have improved a lot on Celebrex but she feels symptoms quickly worsen again if off the medication. More recently she is also started having muscle cramping particularly in her legs not associated with any particular position or activity.  She also reports numbness and tingling sensation particularly in bilateral hands this occurs overnight and first thing in the morning as well as periodically throughout the day.  She had nerve conduction study for this reportedly showing minimal carpal tunnel syndrome not felt to adequately explain symptoms.  She tried wearing wrist braces for this with no symptom improvement.  Throughout that time she denied any loss of appetite only gastrointestinal symptom was increase in loose frequent stools not associated with any pain or bleeding.  She described frequent nighttime awakening often every 1-2 hours without specific causes and has severe fatigue and daytime somnolence able to fall asleep unintentionally whenever she stops moving. She denies  new hair loss, oral ulcers, raynaud's symptoms, lymphadenopathy, or history of blood clots. She reports dry mouth and dry skin symptoms but no problems with her eyes. 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 She has headaches usually bilateral lasting for up to a few hours at a time. She has urinary urgency with occasional urge incontinence. She does not report allodynia symptoms.   LAbs reviewed 08/2021 ANA pos RF neg CCP neg ESR 3 CRP <1 TSH 2.54 CBC wnl CMP Ca 10.4   Imaging reviewed 04/21/21 MR Cardiac stress test IMPRESSION: 1.  Normal stress perfusion 2.  No evidence of cardiac amyloidosis 3.  Normal LV size, wall thickness, and systolic function (EF 62%) 4.  Normal RV size and systolic function (EF 69%) 5.  No late gadolinium enhancement to suggest myocardial scar   Review of Systems  Constitutional:  Positive for fatigue.  HENT:  Positive for mouth dryness. Negative for mouth sores.   Eyes:  Positive for dryness.  Respiratory:  Positive for shortness of breath.   Cardiovascular:  Positive for palpitations. Negative for chest pain.  Gastrointestinal:  Positive for blood in stool. Negative for constipation and diarrhea.  Endocrine: Negative for increased urination.  Genitourinary:  Positive for involuntary urination.  Musculoskeletal:  Positive for joint pain, gait problem, joint pain, joint swelling, myalgias, muscle weakness, morning stiffness and myalgias. Negative for muscle tenderness.  Skin:  Positive for rash. Negative for color change, hair loss and sensitivity to sunlight.  Allergic/Immunologic: Positive for susceptible to infections.  Neurological:  Positive for dizziness and headaches.  Hematological:  Negative for swollen glands.  Psychiatric/Behavioral:  Negative for depressed mood and sleep disturbance. The patient is not nervous/anxious.     PMFS History:  Patient Active Problem List    Diagnosis Date Noted   Bilateral knee pain 08/23/2023   Flank pain 03/18/2023   Family history of elevated blood lipids 03/18/2023   Screening for colon cancer 09/10/2022   Gastroesophageal reflux disease without esophagitis 09/10/2022   Needs flu shot 09/10/2022   Vitamin D deficiency 08/13/2022   Pain in right hip 08/13/2022   Facial rash 02/08/2022   Fibromyalgia 11/09/2021   Positive ANA (antinuclear antibody) 10/12/2021   Impingement syndrome of right shoulder 07/07/2021   Labral tear of shoulder, degenerative, right 07/07/2021   Carpal tunnel syndrome, bilateral 07/07/2021   DDD (degenerative disc disease), cervical 07/07/2021   Other fatigue 05/08/2021   Leg cramps 05/08/2021   Frequency of urination 05/08/2021   Irregular menses 05/08/2021   Scoliosis    Palpitations 01/26/2021   Left breast mass 01/26/2021   Polyuria 01/06/2021   Hypercalcemia 01/06/2021   Malaise 01/06/2021   Need for tetanus, diphtheria, and acellular pertussis (Tdap) vaccine 01/06/2021   Weight loss 01/02/2021   Hyperglycemia 01/02/2021    Past Medical History:  Diagnosis Date   Carpal tunnel syndrome    Fibromyalgia    GERD (gastroesophageal reflux disease)    Hernia, hiatal    Hypercalcemia 01/06/2021   Left breast mass 01/26/2021   Liver cyst    Malaise 01/06/2021   Need for tetanus, diphtheria, and acellular pertussis (Tdap) vaccine 01/06/2021   Palpitations 01/26/2021   Polyuria 01/06/2021   Scoliosis    Weight loss 01/02/2021    Family History  Problem Relation Age of Onset   Heart disease Mother    Hyperlipidemia Mother    Anxiety disorder Sister    GER disease Son    Asthma Son    GER disease Son    Heart disease Son    Past Surgical History:  Procedure Laterality Date   CARPAL TUNNEL RELEASE Right 07/01/2023   Procedure: right carpal tunnel release;  Surgeon: Betha Loa, MD;  Location: [facility];  Service: Orthopedics;  Laterality: Right;   CERVICAL  SPINE SURGERY     Left fooscrew and plate U1L      TUBAL LIGATION     Social History   Social History Narrative   Not on file   Immunization History  Administered Date(s) Administered   Influenza,inj,Quad PF,6+ Mos 09/10/2022   PFIZER(Purple Top)SARS-COV-2 Vaccination 06/28/2020, 07/19/2020   Tdap 01/06/2021     Objective: Vital Signs: BP 100/69 (BP Location: Left Arm, Patient Position: Sitting, Cuff Size: Normal)   Pulse 88   Resp 14   Ht 5\' 5"  (1.651 m)   Wt 146 lb (66.2 kg)   LMP 07/29/2023   BMI 24.30 kg/m    Physical Exam Cardiovascular:  Rate and Rhythm: Normal rate and regular rhythm.  Pulmonary:     Effort: Pulmonary effort is normal.     Breath sounds: Normal breath sounds.  Musculoskeletal:     Right lower leg: No edema.     Left lower leg: No edema.  Skin:    General: Skin is warm and dry.  Neurological:     Mental Status: She is alert.  Psychiatric:        Mood and Affect: Mood normal.      Musculoskeletal Exam:  Elbows full ROM no tenderness or swelling Right wrist surgical site with surrounding erythema wound is closed and without sutures, extension range of motion mildly restricted Low back paraspinal muscles and lateral hip tenderness to pressure with no radiation, normal internal and external rotation range of motion Knees full ROM, right knee anterior lateral tenderness to pressure no palpable swelling Ankles full ROM no tenderness or swelling  Limited musculoskeletal ultrasound of knee demonstrates trace effusion on the right side  Investigation: No additional findings.  Imaging: XR KNEE 3 VIEW LEFT  Result Date: 08/23/2023 X-ray left knee 3 views Medial lateral compartment joint spaces appear well-preserved.  No marginal osteophytes.  No visible joint effusion.  The patellofemoral joint space appears normal.  There is slight lateral displacement or rotational effect. Impression No significant arthritic changes apparent no acute  abnormality  XR KNEE 3 VIEW RIGHT  Result Date: 08/23/2023 X-ray right knee 3 views There is slight medial compartment joint space narrowing including sclerosis on tibial surface.  No significant marginal osteophyte.  No visible joint effusion.  Patellofemoral joint space appears normal there is slight shift or rotational effect. Impression Mild medial compartment osteoarthritis   Recent Labs: Lab Results  Component Value Date   WBC 6.8 03/18/2023   HGB 14.9 03/18/2023   PLT 314 03/18/2023   NA 140 03/18/2023   K 4.9 03/18/2023   CL 104 03/18/2023   CO2 20 03/18/2023   GLUCOSE 68 (L) 03/18/2023   BUN 7 03/18/2023   CREATININE 0.68 03/18/2023   BILITOT 0.3 03/18/2023   ALKPHOS 87 03/18/2023   AST 22 03/18/2023   ALT 15 03/18/2023   PROT 7.1 03/18/2023   ALBUMIN 4.3 03/18/2023   CALCIUM 10.2 03/18/2023   GFRAA 115 01/06/2021    Speciality Comments: No specialty comments available.  Procedures:  No procedures performed Allergies: Meloxicam   Assessment / Plan:     Visit Diagnoses: Chronic pain of both knees - Plan: XR KNEE 3 VIEW RIGHT, XR KNEE 3 VIEW LEFT  Chronic knee pain on both sides use related with intermittent swelling especially on the right.  Trace effusion on exam today x-ray of the knee shows mild medial compartment osteoarthritis with no soft tissue or other acute abnormality visible.  Discussed treatment options.  Prefers to hold off on formal physical therapy referral or trial of any steroid injection today.  She is already on Celebrex and duloxetine.  Provided printed knee exercises and recommended use of compressive flexible knee brace during weightbearing activities when she gets back to work.  Could follow-up sooner as needed if getting worse.  Carpal tunnel syndrome, bilateral - Will refer for nerve conduction study to evaluate and confirm this is the source of symptoms due to the lack of benefit with the repeat and local treatment.  Had release surgery last  month still in recovery process with some restricted range of motion and strength has not improved much yet.  CMC progressing well without  complication.  Fibromyalgia  Fibromyalgia syndrome - Plan: DULoxetine (CYMBALTA) 60 MG capsule, celecoxib (CELEBREX) 200 MG capsule  Currently not in exacerbation [place] doing pretty well her stress level is decreased out of work while recovering from surgery but goes back after another week.  Continue Cymbalta 60 mg daily agree with continuing Celebrex 200 mg daily or twice daily as needed.  Orders: Orders Placed This Encounter  Procedures   XR KNEE 3 VIEW RIGHT   XR KNEE 3 VIEW LEFT   Meds ordered this encounter  Medications   DULoxetine (CYMBALTA) 60 MG capsule    Sig: Take 1 capsule (60 mg total) by mouth daily.    Dispense:  90 capsule    Refill:  1   celecoxib (CELEBREX) 200 MG capsule    Sig: Take 1 capsule (200 mg total) by mouth 2 (two) times daily as needed.    Dispense:  90 capsule    Refill:  1     Follow-Up Instructions: Return in about 6 months (around 02/20/2024) for OA/FMS on NSAID/Cymbalta f/u 6mos.   Fuller Plan, MD  Note - This record has been created using [employer].  Chart creation errors have been sought, but may not always  have been located. Such creation errors do not reflect on  the standard of medical care.

## 2023-08-23 ENCOUNTER — Ambulatory Visit (INDEPENDENT_AMBULATORY_CARE_PROVIDER_SITE_OTHER): Payer: BC Managed Care – PPO

## 2023-08-23 ENCOUNTER — Ambulatory Visit: Payer: BC Managed Care – PPO | Attending: Internal Medicine | Admitting: Internal Medicine

## 2023-08-23 ENCOUNTER — Encounter: Payer: Self-pay | Admitting: Internal Medicine

## 2023-08-23 ENCOUNTER — Ambulatory Visit: Payer: BC Managed Care – PPO

## 2023-08-23 VITALS — BP 100/69 | HR 88 | Resp 14 | Ht 65.0 in | Wt 146.0 lb

## 2023-08-23 DIAGNOSIS — M797 Fibromyalgia: Secondary | ICD-10-CM | POA: Diagnosis not present

## 2023-08-23 DIAGNOSIS — G5603 Carpal tunnel syndrome, bilateral upper limbs: Secondary | ICD-10-CM

## 2023-08-23 DIAGNOSIS — M25561 Pain in right knee: Secondary | ICD-10-CM

## 2023-08-23 DIAGNOSIS — G8929 Other chronic pain: Secondary | ICD-10-CM | POA: Diagnosis not present

## 2023-08-23 DIAGNOSIS — M25562 Pain in left knee: Secondary | ICD-10-CM | POA: Diagnosis not present

## 2023-08-23 MED ORDER — DULOXETINE HCL 60 MG PO CPEP
60.0000 mg | ORAL_CAPSULE | Freq: Every day | ORAL | 1 refills | Status: DC
Start: 2023-08-23 — End: 2024-02-20

## 2023-08-23 MED ORDER — CELECOXIB 200 MG PO CAPS
200.0000 mg | ORAL_CAPSULE | Freq: Two times a day (BID) | ORAL | 1 refills | Status: DC | PRN
Start: 2023-08-23 — End: 2024-02-20

## 2023-08-23 NOTE — Patient Instructions (Signed)
For osteoarthritis several treatments may be beneficial:  - Topical antiinflammatory medicine such as diclofenac or Voltaren can be applied to  affected area as needed. Topical analgesics containing CBD, menthol, or lidocaine can be tried.  - Oral nonsteroidal antiinflammatory drugs (NSAIDs) such as ibuprofen, aleve, celebrex, or mobic are usually helpful for osteoarthritis. These should be taken intermittently or as needed, and always taken with food.  - Turmeric has some antiinflammatory effect similar to NSAIDs and may help, if taken as a supplement should not be taken above recommended doses.   - Compressive sleeves can be helpful to support the joint especially if hurting or swelling with certain activities.  - Physical therapy referral can discuss exercises or activity modification to improve symptoms or strength if needed.  - Local steroid injection is an option if symptoms become worse and not controlled by the above options.

## 2023-09-13 ENCOUNTER — Other Ambulatory Visit: Payer: Self-pay | Admitting: Orthopedic Surgery

## 2023-09-24 ENCOUNTER — Ambulatory Visit: Payer: BC Managed Care – PPO | Admitting: Physician Assistant

## 2023-09-24 ENCOUNTER — Encounter: Payer: Self-pay | Admitting: Physician Assistant

## 2023-09-24 VITALS — BP 118/60 | HR 78 | Ht 65.0 in | Wt 146.0 lb

## 2023-09-24 DIAGNOSIS — Z23 Encounter for immunization: Secondary | ICD-10-CM

## 2023-09-24 DIAGNOSIS — Z131 Encounter for screening for diabetes mellitus: Secondary | ICD-10-CM

## 2023-09-24 DIAGNOSIS — R5383 Other fatigue: Secondary | ICD-10-CM | POA: Diagnosis not present

## 2023-09-24 DIAGNOSIS — Z1231 Encounter for screening mammogram for malignant neoplasm of breast: Secondary | ICD-10-CM

## 2023-09-24 NOTE — Progress Notes (Signed)
Subjective:  Patient ID: Sarah [name], female    DOB: 11/17/1976  Age: 47 y.o. MRN: [id number]  Chief Complaint  Patient presents with   Medical Management of Chronic Issues      Pt in for follow up of fibromyalgia - she states she is doing well on cymbalta 60mg   She also uses celebrex 200mg  qd  Pt with history of vit D def and has been on weekly supplements - Dr Dimple Casey (her rheumatologist) changed her over to taking daily supplement  Pt with history of GERD and hiatal hernia - stable on pepcid 40 and protonix 40mg  qd  Pt states she is concerned about her glucose being elevated and having a family history of diabetes.  However upon further questioning she has been checking glucose after drinking soft drinks with levels being at 114-143  Pt would like flu shot today and schedule mammogram Current Outpatient Medications on File Prior to Visit  Medication Sig Dispense Refill   celecoxib (CELEBREX) 200 MG capsule Take 1 capsule (200 mg total) by mouth 2 (two) times daily as needed. 90 capsule 1   Cholecalciferol (VITAMIN D) 50 MCG (2000 UT) CAPS Take by mouth.     dicyclomine (BENTYL) 10 MG capsule Take 10 mg by mouth every 6 (six) hours as needed.     DULoxetine (CYMBALTA) 60 MG capsule Take 1 capsule (60 mg total) by mouth daily. 90 capsule 1   famotidine (PEPCID) 40 MG tablet Take 40 mg by mouth 2 (two) times daily.     pantoprazole (PROTONIX) 40 MG tablet TAKE 1 TABLET BY MOUTH EVERY DAY 90 tablet 1   No current facility-administered medications on file prior to visit.   Past Medical History:  Diagnosis Date   Carpal tunnel syndrome    Fibromyalgia    GERD (gastroesophageal reflux disease)    Hernia, hiatal    Hypercalcemia 01/06/2021   Left breast mass 01/26/2021   Liver cyst    Malaise 01/06/2021   Need for tetanus, diphtheria, and acellular pertussis (Tdap) vaccine 01/06/2021   Palpitations 01/26/2021   Polyuria 01/06/2021   Scoliosis    Weight loss 01/02/2021    Past Surgical History:  Procedure Laterality Date   CARPAL TUNNEL RELEASE Right 07/01/2023   Procedure: right carpal tunnel release;  Surgeon: Betha Loa, MD;  Location: [facility];  Service: Orthopedics;  Laterality: Right;   CERVICAL SPINE SURGERY     Left fooscrew and plate E9B      TUBAL LIGATION      Family History  Problem Relation Age of Onset   Heart disease Mother    Hyperlipidemia Mother    Anxiety disorder Sister    GER disease Son    Asthma Son    GER disease Son    Heart disease Son    Social History   Socioeconomic History   Marital status: Single    Spouse name: Not on file   Number of children: Not on file   Years of education: Not on file   Highest education level: 12th grade  Occupational History   Not on file  Tobacco Use   Smoking status: Every Day    Current packs/day: 1.00    Average packs/day: 1 pack/day for 10.0 years (10.0 ttl pk-yrs)    Types: Cigarettes    Passive exposure: Never   Smokeless tobacco: Never  Vaping Use   Vaping status: Never Used  Substance and Sexual Activity   Alcohol use: Not Currently  Drug use: Never   Sexual activity: Not Currently  Other Topics Concern   Not on file  Social History Narrative   Not on file   Social Determinants of Health   Financial Resource Strain: Low Risk  (09/22/2023)   Overall Financial Resource Strain (CARDIA)    Difficulty of Paying Living Expenses: Not very hard  Food Insecurity: Food Insecurity Present (09/22/2023)   Hunger Vital Sign    Worried About Running Out of Food in the Last Year: Sometimes true    Ran Out of Food in the Last Year: Never true  Transportation Needs: No Transportation Needs (09/22/2023)   PRAPARE - Administrator, Civil Service (Medical): No    Lack of Transportation (Non-Medical): No  Physical Activity: Unknown (09/22/2023)   Exercise Vital Sign    Days of Exercise per Week: 0 days    Minutes of Exercise per Session: Not on  file  Stress: No Stress Concern Present (09/22/2023)   [employer] of Occupational Health - Occupational Stress Questionnaire    Feeling of Stress : Not at all  Social Connections: Socially Isolated (09/22/2023)   Social Connection and Isolation Panel [NHANES]    Frequency of Communication with Friends and Family: Twice a week    Frequency of Social Gatherings with Friends and Family: Once a week    Attends Religious Services: Never    Database administrator or Organizations: No    Attends Engineer, structural: Not on file    Marital Status: Divorced   CONSTITUTIONAL: has had some fatigue E/N/T: Negative for ear pain, nasal congestion and sore throat.  CARDIOVASCULAR: Negative for chest pain, dizziness, palpitations and pedal edema.  RESPIRATORY: Negative for recent cough and dyspnea.  GASTROINTESTINAL: Negative for abdominal pain, acid reflux symptoms, constipation, diarrhea, nausea and vomiting.  MSK:see HPI INTEGUMENTARY: Negative for rash.  NEUROLOGICAL: Negative for dizziness and headaches.  PSYCHIATRIC: Negative for sleep disturbance and to question depression screen.  Negative for depression, negative for anhedonia.       Objective:  PHYSICAL EXAM:   VS: BP 118/60   Pulse 78   Ht 5\' 5"  (1.651 m)   Wt 146 lb (66.2 kg)   SpO2 97%   BMI 24.30 kg/m   GEN: Well nourished, well developed, in no acute distress   Cardiac: RRR; no murmurs, rubs, or gallops,no edema - Respiratory:  normal respiratory rate and pattern with no distress - normal breath sounds with no rales, rhonchi, wheezes or rubs  MS: no deformity or atrophy  Skin: warm and dry, no rash  Neuro:  Alert and Oriented x 3,  - CN II-Xii grossly intact Psych: euthymic mood, appropriate affect and demeanor   No visits with results within 1 Day(s) from this visit.  Latest known visit with results is:  Admission on 07/01/2023, Discharged on 07/01/2023  Component Date Value Ref Range Status   Preg  Test, Ur 07/01/2023 NEGATIVE  NEGATIVE Final   Comment:        THE SENSITIVITY OF THIS METHODOLOGY IS >24 mIU/mL     Diabetic Foot Exam - Simple   No data filed      Lab Results  Component Value Date   WBC 6.8 03/18/2023   HGB 14.9 03/18/2023   HCT 44.5 03/18/2023   PLT 314 03/18/2023   GLUCOSE 68 (L) 03/18/2023   CHOL 220 (H) 03/18/2023   TRIG 87 03/18/2023   HDL 77 03/18/2023   LDLCALC 128 (H) 03/18/2023  ALT 15 03/18/2023   AST 22 03/18/2023   NA 140 03/18/2023   K 4.9 03/18/2023   CL 104 03/18/2023   CREATININE 0.68 03/18/2023   BUN 7 03/18/2023   CO2 20 03/18/2023   TSH 2.070 03/18/2023   INR 1.0 11/22/2020   HGBA1C 5.4 01/02/2021      Assessment & Plan:   Problem List Items Addressed This Visit       Digestive   Gastroesophageal reflux disease without esophagitis Continue meds     Other   Other fatigue   Relevant Orders   CBC with Differential/Platelet   Comprehensive metabolic panel   TSH   Fibromyalgia - Primary Continue meds Follow up with Dr Dimple Casey as directed      Family history diabetes Hgb A1c pending  Need flu shot Trivalent flulaval given  Breast cancer screening Mammogram order given to patient              .  No orders of the defined types were placed in this encounter.   Orders Placed This Encounter  Procedures   MM 3D SCREENING MAMMOGRAM BILATERAL BREAST   Flu vaccine trivalent PF, 6mos and older(Flulaval,Afluria,Fluarix,Fluzone)   CBC with Differential/Platelet   Comprehensive metabolic panel   TSH   Hemoglobin A1c   VITAMIN D 25 Hydroxy (Vit-D Deficiency, Fractures)     Follow-up: Return in about 6 months (around 03/24/2024) for chronic fasting follow-up.  An After Visit Summary was printed and given to the patient.  Jettie Pagan [facility] [phone]

## 2023-09-24 NOTE — Progress Notes (Deleted)
Subjective:  Patient ID: Sarah [name], female    DOB: 1976/07/10  Age: 47 y.o. MRN: [id number]  Chief Complaint  Patient presents with   Medical Management of Chronic Issues    HPI        09/24/2023    8:12 AM 03/18/2023    8:00 AM 09/10/2022    8:36 AM 01/06/2021   10:58 AM  Depression screen PHQ 2/9  Decreased Interest 0 0 0 0  Down, Depressed, Hopeless 0 0 0 0  PHQ - 2 Score 0 0 0 0  Altered sleeping  0    Tired, decreased energy  3    Change in appetite  0    Feeling bad or failure about yourself   0    Trouble concentrating  0    Moving slowly or fidgety/restless  0    Suicidal thoughts  0    PHQ-9 Score  3    Difficult doing work/chores  Not difficult at all          03/18/2023    8:00 AM  Fall Risk   Falls in the past year? 0  Number falls in past yr: 0  Injury with Fall? 0  Risk for fall due to : No Fall Risks  Follow up Falls evaluation completed    Patient Care Team: Marianne Sofia, PA-C as PCP - General (Physician Assistant) Thomasene Ripple, DO as PCP - Cardiology (Cardiology)   Review of Systems  Current Outpatient Medications on File Prior to Visit  Medication Sig Dispense Refill   celecoxib (CELEBREX) 200 MG capsule Take 1 capsule (200 mg total) by mouth 2 (two) times daily as needed. 90 capsule 1   Cholecalciferol (VITAMIN D) 50 MCG (2000 UT) CAPS Take by mouth.     dicyclomine (BENTYL) 10 MG capsule Take 10 mg by mouth every 6 (six) hours as needed.     DULoxetine (CYMBALTA) 60 MG capsule Take 1 capsule (60 mg total) by mouth daily. 90 capsule 1   famotidine (PEPCID) 40 MG tablet Take 40 mg by mouth 2 (two) times daily.     pantoprazole (PROTONIX) 40 MG tablet TAKE 1 TABLET BY MOUTH EVERY DAY 90 tablet 1   No current facility-administered medications on file prior to visit.   Past Medical History:  Diagnosis Date   Carpal tunnel syndrome    Fibromyalgia    GERD (gastroesophageal reflux disease)    Hernia, hiatal    Hypercalcemia 01/06/2021    Left breast mass 01/26/2021   Liver cyst    Malaise 01/06/2021   Need for tetanus, diphtheria, and acellular pertussis (Tdap) vaccine 01/06/2021   Palpitations 01/26/2021   Polyuria 01/06/2021   Scoliosis    Weight loss 01/02/2021   Past Surgical History:  Procedure Laterality Date   CARPAL TUNNEL RELEASE Right 07/01/2023   Procedure: right carpal tunnel release;  Surgeon: Betha Loa, MD;  Location: [facility];  Service: Orthopedics;  Laterality: Right;   CERVICAL SPINE SURGERY     Left fooscrew and plate N8G      TUBAL LIGATION      Family History  Problem Relation Age of Onset   Heart disease Mother    Hyperlipidemia Mother    Anxiety disorder Sister    GER disease Son    Asthma Son    GER disease Son    Heart disease Son    Social History   Socioeconomic History   Marital status: Single  Spouse name: Not on file   Number of children: Not on file   Years of education: Not on file   Highest education level: 12th grade  Occupational History   Not on file  Tobacco Use   Smoking status: Every Day    Current packs/day: 1.00    Average packs/day: 1 pack/day for 10.0 years (10.0 ttl pk-yrs)    Types: Cigarettes    Passive exposure: Never   Smokeless tobacco: Never  Vaping Use   Vaping status: Never Used  Substance and Sexual Activity   Alcohol use: Not Currently   Drug use: Never   Sexual activity: Not Currently  Other Topics Concern   Not on file  Social History Narrative   Not on file   Social Determinants of Health   Financial Resource Strain: Low Risk  (09/22/2023)   Overall Financial Resource Strain (CARDIA)    Difficulty of Paying Living Expenses: Not very hard  Food Insecurity: Food Insecurity Present (09/22/2023)   Hunger Vital Sign    Worried About Running Out of Food in the Last Year: Sometimes true    Ran Out of Food in the Last Year: Never true  Transportation Needs: No Transportation Needs (09/22/2023)   PRAPARE -  Administrator, Civil Service (Medical): No    Lack of Transportation (Non-Medical): No  Physical Activity: Unknown (09/22/2023)   Exercise Vital Sign    Days of Exercise per Week: 0 days    Minutes of Exercise per Session: Not on file  Stress: No Stress Concern Present (09/22/2023)   [employer] of Occupational Health - Occupational Stress Questionnaire    Feeling of Stress : Not at all  Social Connections: Socially Isolated (09/22/2023)   Social Connection and Isolation Panel [NHANES]    Frequency of Communication with Friends and Family: Twice a week    Frequency of Social Gatherings with Friends and Family: Once a week    Attends Religious Services: Never    Database administrator or Organizations: No    Attends Engineer, structural: Not on file    Marital Status: Divorced    Objective:  BP 118/60   Pulse 78   Ht 5\' 5"  (1.651 m)   Wt 146 lb (66.2 kg)   SpO2 97%   BMI 24.30 kg/m      09/24/2023    8:08 AM 08/23/2023   10:26 AM 07/01/2023    2:50 PM  BP/Weight  Systolic BP 118 100 110  Diastolic BP 60 69 70  Wt. (Lbs) 146 146   BMI 24.3 kg/m2 24.3 kg/m2     Physical Exam  Diabetic Foot Exam - Simple   No data filed      Lab Results  Component Value Date   WBC 6.8 03/18/2023   HGB 14.9 03/18/2023   HCT 44.5 03/18/2023   PLT 314 03/18/2023   GLUCOSE 68 (L) 03/18/2023   CHOL 220 (H) 03/18/2023   TRIG 87 03/18/2023   HDL 77 03/18/2023   LDLCALC 128 (H) 03/18/2023   ALT 15 03/18/2023   AST 22 03/18/2023   NA 140 03/18/2023   K 4.9 03/18/2023   CL 104 03/18/2023   CREATININE 0.68 03/18/2023   BUN 7 03/18/2023   CO2 20 03/18/2023   TSH 2.070 03/18/2023   INR 1.0 11/22/2020   HGBA1C 5.4 01/02/2021      Assessment & Plan:    There are no diagnoses linked to this encounter.   No  orders of the defined types were placed in this encounter.   No orders of the defined types were placed in this encounter.    Follow-up: No  follow-ups on file.   I,Zeriah Baysinger M Ruthvik Barnaby,acting as a Neurosurgeon for [employer], PA-C.,have documented all relevant documentation on the behalf of SARA R DAVIS, PA-C,as directed by  SARA R DAVIS, PA-C while in the presence of SARA R DAVIS, PA-C.   An After Visit Summary was printed and given to the patient.  Jettie Pagan [facility] [phone]

## 2023-09-27 ENCOUNTER — Other Ambulatory Visit: Payer: BC Managed Care – PPO

## 2023-09-28 LAB — CBC WITH DIFFERENTIAL/PLATELET
Basophils Absolute: 0 x10E3/uL (ref 0.0–0.2)
Basos: 1 %
EOS (ABSOLUTE): 0.2 x10E3/uL (ref 0.0–0.4)
Eos: 4 %
Hematocrit: 39.7 % (ref 34.0–46.6)
Hemoglobin: 13.3 g/dL (ref 11.1–15.9)
Immature Grans (Abs): 0 x10E3/uL (ref 0.0–0.1)
Immature Granulocytes: 0 %
Lymphocytes Absolute: 1.6 x10E3/uL (ref 0.7–3.1)
Lymphs: 32 %
MCH: 30.1 pg (ref 26.6–33.0)
MCHC: 33.5 g/dL (ref 31.5–35.7)
MCV: 90 fL (ref 79–97)
Monocytes Absolute: 0.4 x10E3/uL (ref 0.1–0.9)
Monocytes: 7 %
Neutrophils Absolute: 2.9 x10E3/uL (ref 1.4–7.0)
Neutrophils: 56 %
Platelets: 314 x10E3/uL (ref 150–450)
RBC: 4.42 x10E6/uL (ref 3.77–5.28)
RDW: 12.6 % (ref 11.7–15.4)
WBC: 5.1 x10E3/uL (ref 3.4–10.8)

## 2023-09-28 LAB — VITAMIN D 25 HYDROXY (VIT D DEFICIENCY, FRACTURES): Vit D, 25-Hydroxy: 20.1 ng/mL — ABNORMAL LOW (ref 30.0–100.0)

## 2023-09-28 LAB — COMPREHENSIVE METABOLIC PANEL WITH GFR
ALT: 18 IU/L (ref 0–32)
AST: 23 IU/L (ref 0–40)
Albumin: 4.2 g/dL (ref 3.9–4.9)
Alkaline Phosphatase: 85 IU/L (ref 44–121)
BUN/Creatinine Ratio: 13 (ref 9–23)
BUN: 8 mg/dL (ref 6–24)
Bilirubin Total: 0.2 mg/dL (ref 0.0–1.2)
CO2: 20 mmol/L (ref 20–29)
Calcium: 9.9 mg/dL (ref 8.7–10.2)
Chloride: 104 mmol/L (ref 96–106)
Creatinine, Ser: 0.64 mg/dL (ref 0.57–1.00)
Globulin, Total: 2.3 g/dL (ref 1.5–4.5)
Glucose: 92 mg/dL (ref 70–99)
Potassium: 4.8 mmol/L (ref 3.5–5.2)
Sodium: 140 mmol/L (ref 134–144)
Total Protein: 6.5 g/dL (ref 6.0–8.5)
eGFR: 110 mL/min/1.73

## 2023-09-28 LAB — HEMOGLOBIN A1C
Est. average glucose Bld gHb Est-mCnc: 105 mg/dL
Hgb A1c MFr Bld: 5.3 % (ref 4.8–5.6)

## 2023-09-28 LAB — TSH: TSH: 1.4 u[IU]/mL (ref 0.450–4.500)

## 2023-09-30 ENCOUNTER — Other Ambulatory Visit: Payer: Self-pay | Admitting: Physician Assistant

## 2023-09-30 DIAGNOSIS — E559 Vitamin D deficiency, unspecified: Secondary | ICD-10-CM

## 2023-09-30 MED ORDER — VITAMIN D (ERGOCALCIFEROL) 1.25 MG (50000 UNIT) PO CAPS: 50000.0000 [IU] | ORAL_CAPSULE | ORAL | 5 refills | Status: DC

## 2023-10-04 LAB — HM MAMMOGRAPHY

## 2023-10-09 ENCOUNTER — Encounter: Payer: Self-pay | Admitting: Physician Assistant

## 2023-11-04 ENCOUNTER — Other Ambulatory Visit: Payer: Self-pay

## 2023-11-04 ENCOUNTER — Encounter (HOSPITAL_BASED_OUTPATIENT_CLINIC_OR_DEPARTMENT_OTHER): Payer: Self-pay | Admitting: Orthopedic Surgery

## 2023-11-04 NOTE — Progress Notes (Signed)
   11/04/23 1636  PAT Phone Screen  Is the patient taking a GLP-1 receptor agonist? No  Do You Have Diabetes? No  Do You Have Hypertension? No  Have You Ever Been to the ER for Asthma? No  Have You Taken Oral Steroids in the Past 3 Months? No  Do you Take Phenteramine or any Other Diet Drugs? No  Recent  Lab Work, EKG, CXR? No  Do you have a history of heart problems? No  Cardiologist Name (S)  Dr Servando Salina last OV 09/14/21 (see below)  no additional testing ordered, to f/u prn  Have you ever had tests on your heart? Yes  What cardiac tests were performed? (S)  Echo  What date/year were cardiac tests completed? (S)  02/2021 EF 60-65% w/ abnormal LVglobal longitudinal strain - F/U cardiac MRI normal  Results viewable: (S)  CHL Media Tab  Any Recent Hospitalizations? No  Height 5\' 5"  (1.651 m)  Weight 65.8 kg  Pat Appointment Scheduled No  Reason for No Appointment Not Needed

## 2023-11-08 NOTE — Progress Notes (Signed)

## 2023-11-11 ENCOUNTER — Encounter (HOSPITAL_BASED_OUTPATIENT_CLINIC_OR_DEPARTMENT_OTHER): Payer: Self-pay | Admitting: Orthopedic Surgery

## 2023-11-11 ENCOUNTER — Ambulatory Visit (HOSPITAL_BASED_OUTPATIENT_CLINIC_OR_DEPARTMENT_OTHER)
Admission: RE | Admit: 2023-11-11 | Discharge: 2023-11-11 | Disposition: A | Discharge status: Home / Self Care | Payer: BC Managed Care – PPO | Attending: Orthopedic Surgery | Admitting: Orthopedic Surgery

## 2023-11-11 ENCOUNTER — Ambulatory Visit (HOSPITAL_BASED_OUTPATIENT_CLINIC_OR_DEPARTMENT_OTHER): Payer: BC Managed Care – PPO | Admitting: Certified Registered"

## 2023-11-11 ENCOUNTER — Encounter (HOSPITAL_BASED_OUTPATIENT_CLINIC_OR_DEPARTMENT_OTHER)
Admission: RE | Disposition: A | Discharge status: Home / Self Care | Payer: Self-pay | Source: Home / Self Care | Attending: Orthopedic Surgery

## 2023-11-11 ENCOUNTER — Other Ambulatory Visit (HOSPITAL_COMMUNITY): Payer: Self-pay

## 2023-11-11 ENCOUNTER — Other Ambulatory Visit: Payer: Self-pay

## 2023-11-11 DIAGNOSIS — F17210 Nicotine dependence, cigarettes, uncomplicated: Secondary | ICD-10-CM | POA: Diagnosis not present

## 2023-11-11 DIAGNOSIS — K219 Gastro-esophageal reflux disease without esophagitis: Secondary | ICD-10-CM | POA: Insufficient documentation

## 2023-11-11 DIAGNOSIS — M797 Fibromyalgia: Secondary | ICD-10-CM | POA: Diagnosis not present

## 2023-11-11 DIAGNOSIS — G5602 Carpal tunnel syndrome, left upper limb: Secondary | ICD-10-CM | POA: Insufficient documentation

## 2023-11-11 DIAGNOSIS — Z01818 Encounter for other preprocedural examination: Secondary | ICD-10-CM

## 2023-11-11 HISTORY — PX: CARPAL TUNNEL RELEASE: SHX101

## 2023-11-11 LAB — POCT PREGNANCY, URINE: Preg Test, Ur: NEGATIVE

## 2023-11-11 SURGERY — CARPAL TUNNEL RELEASE
Anesthesia: General | Site: Hand | Laterality: Left

## 2023-11-11 MED ORDER — TRAMADOL HCL 50 MG PO TABS: ORAL_TABLET | ORAL | 0 refills | Status: DC

## 2023-11-11 MED ORDER — LIDOCAINE HCL (CARDIAC) PF 100 MG/5ML IV SOSY
PREFILLED_SYRINGE | INTRAVENOUS | Status: DC | PRN
  Administered 2023-11-11: 40 mg via INTRAVENOUS

## 2023-11-11 MED ORDER — LACTATED RINGERS IV SOLN
INTRAVENOUS | Status: DC
Start: 2023-11-11 — End: 2023-11-11

## 2023-11-11 MED ORDER — ACETAMINOPHEN 325 MG PO TABS
650.0000 mg | ORAL_TABLET | Freq: Once | ORAL | Status: AC
  Administered 2023-11-11: 650 mg via ORAL

## 2023-11-11 MED ORDER — ACETAMINOPHEN 160 MG/5ML PO SOLN: 1000.0000 mg | Freq: Once | ORAL | Status: DC | PRN

## 2023-11-11 MED ORDER — FENTANYL CITRATE (PF) 100 MCG/2ML IJ SOLN: 25.0000 ug | INTRAMUSCULAR | Status: DC | PRN

## 2023-11-11 MED ORDER — MIDAZOLAM HCL 2 MG/2ML IJ SOLN
INTRAMUSCULAR | Status: DC | PRN
  Administered 2023-11-11: 2 mg via INTRAVENOUS

## 2023-11-11 MED ORDER — 0.9 % SODIUM CHLORIDE (POUR BTL) OPTIME
TOPICAL | Status: DC | PRN
  Administered 2023-11-11: 100 mL

## 2023-11-11 MED ORDER — PHENYLEPHRINE HCL (PRESSORS) 10 MG/ML IV SOLN
INTRAVENOUS | Status: DC | PRN
  Administered 2023-11-11 (×2): 160 ug via INTRAVENOUS

## 2023-11-11 MED ORDER — ONDANSETRON HCL 4 MG/2ML IJ SOLN
INTRAMUSCULAR | Status: DC | PRN
  Administered 2023-11-11: 4 mg via INTRAVENOUS

## 2023-11-11 MED ORDER — EPHEDRINE SULFATE (PRESSORS) 50 MG/ML IJ SOLN
INTRAMUSCULAR | Status: DC | PRN
  Administered 2023-11-11: 10 mg via INTRAVENOUS

## 2023-11-11 MED ORDER — CEFAZOLIN SODIUM-DEXTROSE 2-4 GM/100ML-% IV SOLN: 2.0000 g | INTRAVENOUS | Status: DC

## 2023-11-11 MED ORDER — PROPOFOL 10 MG/ML IV BOLUS
INTRAVENOUS | Status: AC
  Filled 2023-11-11: qty 20

## 2023-11-11 MED ORDER — ACETAMINOPHEN 325 MG PO TABS
ORAL_TABLET | ORAL | Status: AC
  Filled 2023-11-11: qty 2

## 2023-11-11 MED ORDER — MIDAZOLAM HCL 2 MG/2ML IJ SOLN
INTRAMUSCULAR | Status: AC
Start: 2023-11-11 — End: ?
  Filled 2023-11-11: qty 2

## 2023-11-11 MED ORDER — LACTATED RINGERS IV SOLN: INTRAVENOUS | Status: DC | PRN

## 2023-11-11 MED ORDER — OXYCODONE HCL 5 MG PO TABS
5.0000 mg | ORAL_TABLET | Freq: Once | ORAL | Status: AC | PRN
  Administered 2023-11-11: 5 mg via ORAL

## 2023-11-11 MED ORDER — ONDANSETRON HCL 4 MG/2ML IJ SOLN
INTRAMUSCULAR | Status: AC
  Filled 2023-11-11: qty 2

## 2023-11-11 MED ORDER — ACETAMINOPHEN 500 MG PO TABS: 1000.0000 mg | ORAL_TABLET | Freq: Once | ORAL | Status: DC | PRN

## 2023-11-11 MED ORDER — CEFAZOLIN SODIUM-DEXTROSE 2-3 GM-%(50ML) IV SOLR
INTRAVENOUS | Status: DC | PRN
  Administered 2023-11-11: 2 g via INTRAVENOUS

## 2023-11-11 MED ORDER — OXYCODONE HCL 5 MG PO TABS
ORAL_TABLET | ORAL | Status: AC
  Filled 2023-11-11: qty 1

## 2023-11-11 MED ORDER — PROPOFOL 10 MG/ML IV BOLUS
INTRAVENOUS | Status: DC | PRN
  Administered 2023-11-11: 180 mg via INTRAVENOUS

## 2023-11-11 MED ORDER — FENTANYL CITRATE (PF) 100 MCG/2ML IJ SOLN
INTRAMUSCULAR | Status: DC | PRN
  Administered 2023-11-11: 100 ug via INTRAVENOUS

## 2023-11-11 MED ORDER — OXYCODONE HCL 5 MG/5ML PO SOLN
5.0000 mg | Freq: Once | ORAL | Status: AC | PRN
Start: 2023-11-11 — End: 2023-11-11

## 2023-11-11 MED ORDER — CEFAZOLIN SODIUM-DEXTROSE 2-4 GM/100ML-% IV SOLN
INTRAVENOUS | Status: AC
  Filled 2023-11-11: qty 100

## 2023-11-11 MED ORDER — ACETAMINOPHEN 10 MG/ML IV SOLN: 1000.0000 mg | Freq: Once | INTRAVENOUS | Status: DC | PRN

## 2023-11-11 MED ORDER — FENTANYL CITRATE (PF) 100 MCG/2ML IJ SOLN
INTRAMUSCULAR | Status: AC
  Filled 2023-11-11: qty 2

## 2023-11-11 MED ORDER — BUPIVACAINE HCL (PF) 0.25 % IJ SOLN
INTRAMUSCULAR | Status: DC | PRN
  Administered 2023-11-11: 9 mL

## 2023-11-11 MED ORDER — BUPIVACAINE HCL (PF) 0.25 % IJ SOLN
INTRAMUSCULAR | Status: AC
  Filled 2023-11-11: qty 30

## 2023-11-11 MED ORDER — HYDROCODONE-ACETAMINOPHEN 5-325 MG PO TABS
1.0000 | ORAL_TABLET | Freq: Four times a day (QID) | ORAL | 0 refills | Status: DC | PRN
  Filled 2023-11-11: qty 15, 2d supply, fill #0

## 2023-11-11 SURGICAL SUPPLY — 29 items
BLADE SURG 15 STRL LF DISP TIS (BLADE) ×2 IMPLANT
BNDG ELASTIC 3INX 5YD STR LF (GAUZE/BANDAGES/DRESSINGS) ×1 IMPLANT
BNDG ESMARK 4X9 LF (GAUZE/BANDAGES/DRESSINGS) IMPLANT
BNDG GAUZE DERMACEA FLUFF 4 (GAUZE/BANDAGES/DRESSINGS) ×1 IMPLANT
CHLORAPREP W/TINT 26 (MISCELLANEOUS) ×1 IMPLANT
CORD BIPOLAR FORCEPS 12FT (ELECTRODE) ×1 IMPLANT
COVER BACK TABLE 60X90IN (DRAPES) ×1 IMPLANT
COVER MAYO STAND STRL (DRAPES) ×1 IMPLANT
CUFF TOURN SGL QUICK 18X4 (TOURNIQUET CUFF) ×1 IMPLANT
DRAPE EXTREMITY T 121X128X90 (DISPOSABLE) ×1 IMPLANT
DRAPE SURG 17X23 STRL (DRAPES) ×1 IMPLANT
GAUZE PAD ABD 8X10 STRL (GAUZE/BANDAGES/DRESSINGS) ×1 IMPLANT
GAUZE SPONGE 4X4 12PLY STRL (GAUZE/BANDAGES/DRESSINGS) ×1 IMPLANT
GAUZE XEROFORM 1X8 LF (GAUZE/BANDAGES/DRESSINGS) ×1 IMPLANT
GLOVE BIO SURGEON STRL SZ7.5 (GLOVE) ×1 IMPLANT
GLOVE BIOGEL PI IND STRL 8 (GLOVE) ×1 IMPLANT
GOWN STRL REUS W/ TWL LRG LVL3 (GOWN DISPOSABLE) ×1 IMPLANT
GOWN STRL REUS W/TWL XL LVL3 (GOWN DISPOSABLE) ×1 IMPLANT
NDL HYPO 25X1 1.5 SAFETY (NEEDLE) ×1 IMPLANT
NEEDLE HYPO 25X1 1.5 SAFETY (NEEDLE) ×1
NS IRRIG 1000ML POUR BTL (IV SOLUTION) ×1 IMPLANT
PACK BASIN DAY SURGERY FS (CUSTOM PROCEDURE TRAY) ×1 IMPLANT
PADDING CAST ABS COTTON 4X4 ST (CAST SUPPLIES) ×1 IMPLANT
STOCKINETTE 4X48 STRL (DRAPES) ×1 IMPLANT
SUT ETHILON 4 0 PS 2 18 (SUTURE) ×1 IMPLANT
SYR BULB EAR ULCER 3OZ GRN STR (SYRINGE) ×1 IMPLANT
SYR CONTROL 10ML LL (SYRINGE) ×1 IMPLANT
TOWEL GREEN STERILE FF (TOWEL DISPOSABLE) ×2 IMPLANT
UNDERPAD 30X36 HEAVY ABSORB (UNDERPADS AND DIAPERS) ×1 IMPLANT

## 2023-11-11 NOTE — Op Note (Addendum)
11/11/2023 [facility]                              OPERATIVE REPORT   PREOPERATIVE DIAGNOSIS:  Left carpal tunnel syndrome.  POSTOPERATIVE DIAGNOSIS:  Left carpal tunnel syndrome.  PROCEDURE:  Left carpal tunnel release.  SURGEON:  Betha Loa, MD  ASSISTANT:  none.  ANESTHESIA: General  IV FLUIDS:  Per anesthesia flow sheet.  ESTIMATED BLOOD LOSS:  Minimal.  COMPLICATIONS:  None.  SPECIMENS:  None.  TOURNIQUET TIME:    Total Tourniquet Time Documented: Upper Arm (Left) - 10 minutes Total: Upper Arm (Left) - 10 minutes   DISPOSITION:  Stable to PACU.  LOCATION: [facility]  INDICATIONS:  47 y.o. yo female with numbness and tingling left hand.  Positive nerve conduction studies. She wishes to proceed with left carpal tunnel release.  Risks, benefits and alternatives of surgery were discussed including the risk of blood loss; infection; damage to nerves, vessels, tendons, ligaments, bone; failure of surgery; need for additional surgery; complications with wound healing; continued pain; recurrence of carpal tunnel syndrome; and damage to motor branch. She voiced understanding of these risks and elected to proceed.   OPERATIVE COURSE:  After being identified preoperatively by myself, the patient and I agreed upon the procedure and site of procedure.  The surgical site was marked.  Surgical consent had been signed.  She was given IV Ancef as preoperative antibiotic prophylaxis.  She was transferred to the operating room and placed on the operating room table in supine position with the Left upper extremity on an armboard.  General anesthesia was induced by the anesthesiologist.  Left upper extremity was prepped and draped in normal sterile orthopaedic fashion.  A surgical pause was performed between the surgeons, anesthesia, and operating room staff, and all were in agreement as to the patient, procedure, and site of procedure.  Tourniquet at the  proximal aspect of the extremity was inflated to 250 mmHg after exsanguination of the arm with an Esmarch bandage  Incision was made over the transverse carpal ligament and carried into the subcutaneous tissues by spreading technique.  Bipolar electrocautery was used to obtain hemostasis.  The palmar fascia was sharply incised.  The transverse carpal ligament was identified.  The fascia distal to the ligament was opened.  Retractor was placed and the flexor tendons were identified.  The flexor tendon to the ring finger was identified and retracted radially.  The transverse carpal ligament was then incised from distal to proximal under direct visualization.  Scissors were used to split the distal aspect of the volar antebrachial fascia.  A finger was placed into the wound to ensure complete decompression, which was the case.  The nerve was examined.  It was adherent to the radial leaflet.  The motor branch was identified and was intact.  The wound was copiously irrigated with sterile saline.  It was then closed with 4-0 nylon in a horizontal mattress fashion.  It was injected with 0.25% plain Marcaine to aid in postoperative analgesia.  It was dressed with sterile Xeroform, 4x4s, an ABD, and wrapped with Kerlix and an Ace bandage.  Tourniquet was deflated at 10 minutes.  Fingertips were pink with brisk capillary refill after deflation of the tourniquet.  Operative drapes were broken down.  The patient was awoken from anesthesia safely.  She was transferred back to stretcher and taken to the PACU in stable condition.  I will see her back in the office in 1 week for postoperative followup.  I will give her a prescription for Norco 5/325 1-2 tabs PO q6 hours prn pain, dispense # 15.      Betha Loa, MD Electronically signed, 11/11/23

## 2023-11-11 NOTE — Anesthesia Preprocedure Evaluation (Addendum)
Anesthesia Evaluation  Patient identified by MRN, date of birth, ID band Patient awake    Reviewed: Allergy & Precautions, NPO status , Patient's Chart, lab work & pertinent test results  History of Anesthesia Complications Negative for: history of anesthetic complications  Airway Mallampati: I  TM Distance: >3 FB Neck ROM: Full    Dental  (+) Poor Dentition, Missing, Chipped   Pulmonary neg shortness of breath, neg sleep apnea, neg COPD, neg recent URI, Current Smoker   breath sounds clear to auscultation       Cardiovascular negative cardio ROS  Rhythm:Regular  1. Left ventricular ejection fraction, by estimation, is 60 to 65%. The  left ventricle has normal function. The left ventricle has no regional  wall motion abnormalities. Left ventricular diastolic parameters were  normal. Speckled pattern appreciated in  the wall of the interventricular septum. No evidence of left ventricular  hypertrophy.   2. The average left ventricular global longitudinal strain is abnormal (  -11.4 %). The strain pattern have the "cherry on the top" appearance.  Recommend further testing to rule out cardiac amyloid.   3. Right ventricular systolic function is normal. The right ventricular  size is normal. There is normal pulmonary artery systolic pressure.   4. The mitral valve is normal in structure. Trivial mitral valve  regurgitation. No evidence of mitral stenosis.   5. The aortic valve is normal in structure. Aortic valve regurgitation is  not visualized. No aortic stenosis is present.   6. The inferior vena cava is normal in size with greater than 50%  respiratory variability, suggesting right atrial pressure of 3 mmHg.     Neuro/Psych neg Seizures  Neuromuscular disease    GI/Hepatic Neg liver ROS, hiatal hernia,GERD  ,,  Endo/Other  negative endocrine ROS    Renal/GU negative Renal ROS     Musculoskeletal  (+) Arthritis ,   Fibromyalgia -  Abdominal   Peds  Hematology negative hematology ROS (+)   Anesthesia Other Findings   Reproductive/Obstetrics                             Anesthesia Physical Anesthesia Plan  ASA: 2  Anesthesia Plan: General   Post-op Pain Management: Tylenol PO (pre-op)*   Induction: Intravenous  PONV Risk Score and Plan: 2 and Ondansetron and Dexamethasone  Airway Management Planned: LMA  Additional Equipment: None  Intra-op Plan:   Post-operative Plan: Extubation in OR  Informed Consent: I have reviewed the patients History and Physical, chart, labs and discussed the procedure including the risks, benefits and alternatives for the proposed anesthesia with the patient or authorized representative who has indicated his/her understanding and acceptance.     Dental advisory given  Plan Discussed with: CRNA  Anesthesia Plan Comments:        Anesthesia Quick Evaluation

## 2023-11-11 NOTE — Anesthesia Postprocedure Evaluation (Signed)
Anesthesia Post Note  Patient: Sarah [name]  Procedure(s) Performed: LEFT CARPAL TUNNEL RELEASE (Left: Hand)     Patient location during evaluation: PACU Anesthesia Type: General Level of consciousness: awake and alert Pain management: pain level controlled Vital Signs Assessment: post-procedure vital signs reviewed and stable Respiratory status: spontaneous breathing, nonlabored ventilation and respiratory function stable Cardiovascular status: blood pressure returned to baseline and stable Postop Assessment: no apparent nausea or vomiting Anesthetic complications: no   No notable events documented.  Last Vitals:  Vitals:   11/11/23 1415 11/11/23 1425  BP: 114/75 (!) 123/91  Pulse: 71 85  Resp: 18   Temp:  (!) 36.3 C  SpO2: 94% 95%    Last Pain:  Vitals:   11/11/23 1425  TempSrc: Temporal  PainSc: 0-No pain                 Megen Madewell

## 2023-11-11 NOTE — Anesthesia Procedure Notes (Addendum)
Procedure Name: LMA Insertion Date/Time: 11/11/2023 1:22 PM  Performed by: Karen Kitchens, CRNAPre-anesthesia Checklist: Patient identified, Emergency Drugs available, Suction available and Patient being monitored Patient Re-evaluated:Patient Re-evaluated prior to induction Oxygen Delivery Method: Circle system utilized Preoxygenation: Pre-oxygenation with 100% oxygen Induction Type: IV induction Ventilation: Mask ventilation without difficulty LMA: LMA inserted LMA Size: 3.0 Number of attempts: 1 Airway Equipment and Method: Bite block Placement Confirmation: positive ETCO2, breath sounds checked- equal and bilateral and CO2 detector Tube secured with: Tape Dental Injury: Teeth and Oropharynx as per pre-operative assessment  Comments: Teeth and lips as preop

## 2023-11-11 NOTE — Transfer of Care (Signed)
Immediate Anesthesia Transfer of Care Note  Patient: Sarah [name]  Procedure(s) Performed: LEFT CARPAL TUNNEL RELEASE (Left: Hand)  Patient Location: PACU  Anesthesia Type:General  Level of Consciousness: awake, alert , and oriented  Airway & Oxygen Therapy: Patient Spontanous Breathing and Patient connected to face mask oxygen  Post-op Assessment: Report given to RN and Post -op Vital signs reviewed and stable  Post vital signs: Reviewed and stable  Last Vitals:  Vitals Value Taken Time  BP 123/82 11/11/23 1345  Temp    Pulse 83 11/11/23 1346  Resp 14 11/11/23 1346  SpO2 97 % 11/11/23 1346  Vitals shown include unfiled device data.  Last Pain:  Vitals:   11/11/23 1138  TempSrc: Temporal  PainSc: 0-No pain         Complications: No notable events documented.

## 2023-11-11 NOTE — Discharge Instructions (Addendum)
No tylenol until 6:45 p.m.   [facility] Instructions Hand Surgery  Wound Care: Keep your hand elevated above the level of your heart.  Do not allow it to dangle by your side.  Keep the dressing dry and do not remove it unless your doctor advises you to do so.  He will usually change it at the time of your post-op visit.  Moving your fingers is advised to stimulate circulation but will depend on the site of your surgery.  If you have a splint applied, your doctor will advise you regarding movement.  Activity: Do not drive or operate machinery today.  Rest today and then you may return to your normal activity and work as indicated by your physician.  Diet:  Drink liquids today or eat a light diet.  You may resume a regular diet tomorrow.    General expectations: Pain for two to three days. Fingers may become slightly swollen.  Call your doctor if any of the following occur: Severe pain not relieved by pain medication. Elevated temperature. Dressing soaked with blood. Inability to move fingers. White or bluish color to fingers.    Post Anesthesia Home Care Instructions  Activity: Get plenty of rest for the remainder of the day. A responsible individual must stay with you for 24 hours following the procedure.  For the next 24 hours, DO NOT: -Drive a car -Advertising copywriter -Drink alcoholic beverages -Take any medication unless instructed by your physician -Make any legal decisions or sign important papers.  Meals: Start with liquid foods such as gelatin or soup. Progress to regular foods as tolerated. Avoid greasy, spicy, heavy foods. If nausea and/or vomiting occur, drink only clear liquids until the nausea and/or vomiting subsides. Call your physician if vomiting continues.  Special Instructions/Symptoms: Your throat may feel dry or sore from the anesthesia or the breathing tube placed in your throat during surgery. If this causes discomfort, gargle with warm salt water. The  discomfort should disappear within 24 hours.  If you had a scopolamine patch placed behind your ear for the management of post- operative nausea and/or vomiting:  1. The medication in the patch is effective for 72 hours, after which it should be removed.  Wrap patch in a tissue and discard in the trash. Wash hands thoroughly with soap and water. 2. You may remove the patch earlier than 72 hours if you experience unpleasant side effects which may include dry mouth, dizziness or visual disturbances. 3. Avoid touching the patch. Wash your hands with soap and water after contact with the patch.

## 2023-11-11 NOTE — H&P (Signed)
Sarah [name] is an 47 y.o. female.   Chief Complaint: carpal tunnel syndrome HPI: 47 y.o. yo female with numbness and tingling left hand.  Positive nerve conduction studies. She wishes to have left carpal tunnel release.   Allergies:  Allergies  Allergen Reactions   Meloxicam Other (See Comments)    eyesight, patient can take ibuprofen and Nsaids fine.  Eyesight, , eyesight, patient can take ibuprofen and Nsaids fine.    Past Medical History:  Diagnosis Date   Carpal tunnel syndrome    Fibromyalgia    GERD (gastroesophageal reflux disease)    Hernia, hiatal    Hypercalcemia 01/06/2021   Left breast mass 01/26/2021   Liver cyst    Malaise 01/06/2021   Need for tetanus, diphtheria, and acellular pertussis (Tdap) vaccine 01/06/2021   Palpitations 01/26/2021   Polyuria 01/06/2021   Scoliosis    Weight loss 01/02/2021    Past Surgical History:  Procedure Laterality Date   CARPAL TUNNEL RELEASE Right 07/01/2023   Procedure: right carpal tunnel release;  Surgeon: Betha Loa, MD;  Location: [facility];  Service: Orthopedics;  Laterality: Right;   CERVICAL SPINE SURGERY     Left fooscrew and plate Z6X      TUBAL LIGATION      Family History: Family History  Problem Relation Age of Onset   Heart disease Mother    Hyperlipidemia Mother    Anxiety disorder Sister    GER disease Son    Asthma Son    GER disease Son    Heart disease Son     Social History:   reports that she has been smoking cigarettes. She has a 10 pack-year smoking history. She has never been exposed to tobacco smoke. She has never used smokeless tobacco. She reports that she does not currently use alcohol. She reports that she does not use drugs.  Medications: Medications Prior to Admission  Medication Sig Dispense Refill   celecoxib (CELEBREX) 200 MG capsule Take 1 capsule (200 mg total) by mouth 2 (two) times daily as needed. 90 capsule 1   dicyclomine (BENTYL) 10 MG capsule Take 10  mg by mouth every 6 (six) hours as needed.     DULoxetine (CYMBALTA) 60 MG capsule Take 1 capsule (60 mg total) by mouth daily. 90 capsule 1   famotidine (PEPCID) 40 MG tablet Take 40 mg by mouth 2 (two) times daily.     pantoprazole (PROTONIX) 40 MG tablet TAKE 1 TABLET BY MOUTH EVERY DAY 90 tablet 1   Vitamin D, Ergocalciferol, (DRISDOL) 1.25 MG (50000 UNIT) CAPS capsule Take 1 capsule (50,000 Units total) by mouth every 7 (seven) days. 5 capsule 5    Results for orders placed or performed during the hospital encounter of 11/11/23 (from the past 48 hours)  Pregnancy, urine POC     Status: None   Collection Time: 11/11/23 11:30 AM  Result Value Ref Range   Preg Test, Ur NEGATIVE NEGATIVE    Comment:        THE SENSITIVITY OF THIS METHODOLOGY IS >24 mIU/mL     No results found.    Blood pressure 114/81, pulse 80, temperature 98.6 F (37 C), temperature source Temporal, resp. rate 16, height 5\' 5"  (1.651 m), weight 65 kg, last menstrual period 10/27/2023, SpO2 100%.  General appearance: alert, cooperative, and appears stated age Head: Normocephalic, without obvious abnormality, atraumatic Neck: supple, symmetrical, trachea midline Extremities: Intact sensation and capillary refill all digits.  +epl/fpl/io.  No  wounds.  Pulses: 2+ and symmetric Skin: Skin color, texture, turgor normal. No rashes or lesions Neurologic: Grossly normal Incision/Wound: none  Assessment/Plan Left carpal tunnel syndrome.  Non operative and operative treatment options have been discussed with the patient and patient wishes to proceed with operative treatment. Risks, benefits, and alternatives of surgery have been discussed and the patient agrees with the plan of care.   Betha Loa 11/11/2023, 1:03 PM

## 2023-11-12 ENCOUNTER — Encounter (HOSPITAL_BASED_OUTPATIENT_CLINIC_OR_DEPARTMENT_OTHER): Payer: Self-pay | Admitting: Orthopedic Surgery

## 2023-12-17 ENCOUNTER — Other Ambulatory Visit: Payer: Self-pay | Admitting: Physician Assistant

## 2023-12-17 DIAGNOSIS — K219 Gastro-esophageal reflux disease without esophagitis: Secondary | ICD-10-CM

## 2024-02-06 NOTE — Progress Notes (Signed)
 Office Visit Note  Patient: Sarah [name]             Date of Birth: 06/10/1976           MRN: [id number]             PCP: Marianne Sofia, PA-C Referring: Marianne Sofia, PA-C Visit Date: 02/20/2024   Subjective:  Follow-up (Patient states sometimes it feels like her left knee locks up. Patient states she has been having pins and needles in her feet and her feet can go numb. )    Discussed the use of AI scribe software for clinical note transcription with the patient, who gave verbal consent to proceed.  History of Present Illness   Sarah [name] is a 48 y.o. female here for follow up for osteoarthritis and fibromyalgia on cymbalta 60 mg and Celebrex 200 mg twice daily.  She has now had carpal tunnel release surgery on both wrists with resolution of the associated finger pain and numbness.  Did have 1 injection in the left hand after that surgery.  She experiences numbness and tingling in the bottom of her feet, described as 'pins and needles,' which began a couple of months ago. These symptoms occur once or twice a week and resolve spontaneously. There is no significant pain, but she describes the sensation as 'needles sticking' in her feet. Occasional swelling is noted in her ankles, with more frequent swelling in her knees.  She has a history of knee issues, reporting that her knees sometimes feel as though they are 'locking up,' particularly upon waking. This has been occurring for three to four months. She experiences significant pain during these episodes, which sometimes wake her at night. More swelling is noted in her right knee compared to her left. She continues to take Celebrex twice daily and Cymbalta, which she has been on long-term.  She reports occasional dizziness and expresses concern about her body's overall pain, similar to when she first sought treatment for fibromyalgia. A glucose A1c test done by her family doctor was normal. She experiences difficulty rising from the  floor, requiring her to use her hands and knees for support.  She mentions a history of stomach pain, which has improved since starting Bentyl (dicyclomine). Occasional nausea is present, but she reports regular bowel movements. She has been previously noted to have vitamin D deficiency despite taking over-the-counter supplements. No recent illness, but she mentions upper respiratory symptoms with a productive cough in the mornings. No recent changes in her diet or significant gastrointestinal symptoms aside from occasional nausea. No recent changes in her smoking status and denies numbness in her fingers.      Previous HPI 08/23/2023 Sarah [name] is a 48 y.o. female here for follow up for carpal tunnel syndrome and fibromyalgia on cymbalta 60 mg and Celebrex 200 mg twice daily.  Since our last visit she saw Dr. Merlyn Lot and had right wrist carpal tunnel release surgery on August 5.  So far she is still working on her postoperative rehab exercises still has some stiffness and decreased extension range of motion.  She has been out of work since surgery for recovery and her day-to-day pain in hips and knees has mostly been better.  Currently experiencing an increase in knee pain worse on the right side with a small amount of swelling.  This has been coming and going lasting for days at a time and for several years.   Previous HPI 02/11/2023 Sarah [name] is  a 48 y.o. female here for follow up for carpal tunnel syndrome and fibromyalgia on cymbalta 60 mg and recent wrist injections January. Recent GI illness diarrhea and not eating labs last Wednesday low albumin Thursday with low electrolytes. Getting more muscle cramping in both legs but not a full spasm or cramp like she has normally had in the past. Now eating well again since yesterday. Flexeril helps but only taking sometimes due to drowsiness.   Previous HPI 08/13/22 Sarah [name] is a 48 y.o. female here for follow up with joint pains and  myofascial pain on cymbalta 60 mg daily and celebrex 200 mg daily or as needed. 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 She notices some shakiness or tremor in her hands that might be increased. Neck pain and muscle soreness. She has some elbow pains and there is a rash on her left elbow just in the past week or two. She also is having pain around her right hip and radiating into the right leg sometimes often after standing from driving for 04-54 minutes.   Previous HPI 12/05/22 Patient presented today for bilateral ultrasound-guided carpal tunnel steroid injection due to progressively worsening hand pain and numbness again since November of last year.  Reviewed previous procedure which which we did in June after which she initially had complete improvement in symptoms before they started to return.  Tolerated the previous injections with no adverse effect.   Injections performed today under ultrasound guidance both sides.  Patient experienced a brief vagal episode which recovered after approximately 10 minutes waiting.  Provided printed range of motion exercises for carpal and cubital tunnel symptoms.   02/08/2022  Sarah [name] is a 48 y.o. female here for follow up with joint pains and myofascial pain after starting cymbalta and titrate to 60 mg and with celebrex 200 mg BID. She noticed an improvement in pain of both legs after increasing the cymbalta dose to 60 mg. She has intermittent hand numbness continuing to come and go in both hands, bothering her a bit more than before. She reports one incident of using too hot water for her son with inability to feel temperature.   Previous HPI 11/09/21 Sarah [name] is a 48 y.o. female here for follow up with multiple symptoms including joint pains, fatigue, numbness, and unintentional weight loss with positive ANA serology. Labs at initial  visit showing positive RNP 1.5 otherwise normal CK norma complements low ANA titer 1:40. She continues having ongoing symptoms with pain all over worst in legs at the moment and very fatigued.   Previous HPI 10/12/21 Sarah [name] is a 48 y.o. female here for joint pains, numbness, fatigue, and weight loss with positive ANA testing.  Symptoms have really been ongoing since last year no specific onset or proceeding infections or medical events that she can recall.  Initially this was accompanied by a generalized body aches and fatigue as well as a progressive unexplained weight loss from her baseline down to as low as 105 pounds.  She has had some amount of chronic joint pain previous evaluations of bilateral rotator cuff arthropathy, mild degenerative disc disease in the lumbar spine, previous right leg fracture with residual swelling intermittently at the ankle and knee.  However these were increased additionally with problems of a lot of stiffness and pain in the morning and getting up from stationary  positions particularly in bilateral hips. Particularly notices pain whenever she has to drive for an hour or longer at a time.  These have improved a lot on Celebrex but she feels symptoms quickly worsen again if off the medication. More recently she is also started having muscle cramping particularly in her legs not associated with any particular position or activity.  She also reports numbness and tingling sensation particularly in bilateral hands this occurs overnight and first thing in the morning as well as periodically throughout the day.  She had nerve conduction study for this reportedly showing minimal carpal tunnel syndrome not felt to adequately explain symptoms.  She tried wearing wrist braces for this with no symptom improvement.  Throughout that time she denied any loss of appetite only gastrointestinal symptom was increase in loose frequent stools not associated with any pain or bleeding.  She  described frequent nighttime awakening often every 1-2 hours without specific causes and has severe fatigue and daytime somnolence able to fall asleep unintentionally whenever she stops moving. She denies new hair loss, oral ulcers, raynaud's symptoms, lymphadenopathy, or history of blood clots. She reports dry mouth and dry skin symptoms but no problems with her eyes. 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 She has headaches usually bilateral lasting for up to a few hours at a time. She has urinary urgency with occasional urge incontinence. She does not report allodynia symptoms.   LAbs reviewed 08/2021 ANA pos RF neg CCP neg ESR 3 CRP <1 TSH 2.54 CBC wnl CMP Ca 10.4   Imaging reviewed 04/21/21 MR Cardiac stress test IMPRESSION: 1.  Normal stress perfusion 2.  No evidence of cardiac amyloidosis 3.  Normal LV size, wall thickness, and systolic function (EF 62%) 4.  Normal RV size and systolic function (EF 69%) 5.  No late gadolinium enhancement to suggest myocardial scar   Review of Systems  Constitutional:  Positive for fatigue.  HENT:  Positive for mouth dryness. Negative for mouth sores.   Eyes:  Positive for dryness.  Respiratory:  Positive for shortness of breath.   Cardiovascular:  Positive for palpitations. Negative for chest pain.  Gastrointestinal:  Positive for blood in stool. Negative for constipation and diarrhea.  Endocrine: Positive for increased urination.  Genitourinary:  Positive for involuntary urination.  Musculoskeletal:  Positive for joint pain, gait problem, joint pain, joint swelling, myalgias, muscle weakness, morning stiffness, muscle tenderness and myalgias.  Skin:  Positive for rash. Negative for color change, hair loss and sensitivity to sunlight.  Allergic/Immunologic: Negative for susceptible to infections.  Neurological:  Positive for dizziness and headaches.  Hematological:  Negative  for swollen glands.  Psychiatric/Behavioral:  Positive for sleep disturbance. Negative for depressed mood. The patient is not nervous/anxious.     PMFS History:  Patient Active Problem List   Diagnosis Date Noted   Bilateral knee pain 08/23/2023   Flank pain 03/18/2023   Family history of elevated blood lipids 03/18/2023   Screening for colon cancer 09/10/2022   Gastroesophageal reflux disease without esophagitis 09/10/2022   Needs flu shot 09/10/2022   Vitamin D deficiency 08/13/2022   Pain in right hip 08/13/2022   Facial rash 02/08/2022   Fibromyalgia 11/09/2021   Positive ANA (antinuclear antibody) 10/12/2021   Impingement syndrome of right shoulder 07/07/2021   Labral tear of shoulder, degenerative, right 07/07/2021   Carpal tunnel syndrome, bilateral 07/07/2021   DDD (degenerative disc disease), cervical 07/07/2021   Other  fatigue 05/08/2021   Leg cramps 05/08/2021   Frequency of urination 05/08/2021   Irregular menses 05/08/2021   Scoliosis    Palpitations 01/26/2021   Left breast mass 01/26/2021   Polyuria 01/06/2021   Hypercalcemia 01/06/2021   Malaise 01/06/2021   Need for tetanus, diphtheria, and acellular pertussis (Tdap) vaccine 01/06/2021   Weight loss 01/02/2021   Hyperglycemia 01/02/2021    Past Medical History:  Diagnosis Date   Carpal tunnel syndrome    Fibromyalgia    GERD (gastroesophageal reflux disease)    Hernia, hiatal    Hypercalcemia 01/06/2021   Left breast mass 01/26/2021   Liver cyst    Malaise 01/06/2021   Need for tetanus, diphtheria, and acellular pertussis (Tdap) vaccine 01/06/2021   Palpitations 01/26/2021   Polyuria 01/06/2021   Scoliosis    Weight loss 01/02/2021    Family History  Problem Relation Age of Onset   Heart disease Mother    Hyperlipidemia Mother    Anxiety disorder Sister    GER disease Son    Asthma Son    GER disease Son    Heart disease Son    Past Surgical History:  Procedure Laterality Date   CARPAL  TUNNEL RELEASE Right 07/01/2023   Procedure: right carpal tunnel release;  Surgeon: Betha Loa, MD;  Location: [facility];  Service: Orthopedics;  Laterality: Right;   CARPAL TUNNEL RELEASE Left 11/11/2023   Procedure: LEFT CARPAL TUNNEL RELEASE;  Surgeon: Betha Loa, MD;  Location: [facility];  Service: Orthopedics;  Laterality: Left;  30 MIN   CERVICAL SPINE SURGERY     Left fooscrew and plate V5I      TUBAL LIGATION     Social History   Social History Narrative   Not on file   Immunization History  Administered Date(s) Administered   Influenza, Seasonal, Injecte, Preservative Fre 09/24/2023   Influenza,inj,Quad PF,6+ Mos 09/10/2022   PFIZER(Purple Top)SARS-COV-2 Vaccination 06/28/2020, 07/19/2020   Tdap 01/06/2021     Objective: Vital Signs: BP 115/76 (BP Location: Left Arm, Patient Position: Sitting, Cuff Size: Large)   Pulse 72   Ht 5\' 5"  (1.651 m)   Wt 146 lb (66.2 kg)   LMP 02/20/2024   BMI 24.30 kg/m    Physical Exam Constitutional:      Comments: Somewhat disheveled appearance  HENT:     Mouth/Throat:     Comments: Poor dentition Eyes:     Conjunctiva/sclera: Conjunctivae normal.  Cardiovascular:     Rate and Rhythm: Normal rate and regular rhythm.     Comments: Readily palpable pulses in both feet Pulmonary:     Effort: Pulmonary effort is normal.     Breath sounds: Normal breath sounds.  Musculoskeletal:     Right lower leg: No edema.     Left lower leg: No edema.  Lymphadenopathy:     Cervical: No cervical adenopathy.  Skin:    General: Skin is warm and dry.     Findings: No rash.  Neurological:     Mental Status: She is alert.  Psychiatric:        Mood and Affect: Mood normal.      Musculoskeletal Exam:  Elbows full ROM no tenderness or swelling Wrists full ROM no tenderness or swelling, well healed CTS scars Fingers full ROM no tenderness or swelling Knees full ROM, mild tenderness to pressure more  anteriorly, may be slightly increased patellar mobility but with negative patellar grind test Ankles full ROM no tenderness  or swelling   Investigation: No additional findings.  Imaging: No results found.  Recent Labs: Lab Results  Component Value Date   WBC 5.1 09/27/2023   HGB 13.3 09/27/2023   PLT 314 09/27/2023   NA 140 09/27/2023   K 4.8 09/27/2023   CL 104 09/27/2023   CO2 20 09/27/2023   GLUCOSE 92 09/27/2023   BUN 8 09/27/2023   CREATININE 0.64 09/27/2023   BILITOT 0.2 09/27/2023   ALKPHOS 85 09/27/2023   AST 23 09/27/2023   ALT 18 09/27/2023   PROT 6.5 09/27/2023   ALBUMIN 4.2 09/27/2023   CALCIUM 9.9 09/27/2023   GFRAA 115 01/06/2021    Speciality Comments: No specialty comments available.  Procedures:  No procedures performed Allergies: Meloxicam   Assessment / Plan:     Visit Diagnoses: Fibromyalgia - Plan: Sedimentation rate, C-reactive protein, Comprehensive metabolic panel with GFR, VITAMIN D 25 Hydroxy (Vit-D Deficiency, Fractures), B12 and Folate Panel, celecoxib (CELEBREX) 200 MG capsule  Chronic pain of both knees  Carpal tunnel syndrome, bilateral  Fibromyalgia syndrome - Cymbalta 60 mg daily agree with continuing Celebrex 200 mg daily or twice daily as needed. - Plan: celecoxib (CELEBREX) 200 MG capsule, DULoxetine (CYMBALTA) 60 MG capsule  Fibromyalgia - Cymbalta 60 mg p.o. daily - Plan: Sedimentation rate, C-reactive protein, Comprehensive metabolic panel with GFR, VITAMIN D 25 Hydroxy (Vit-D Deficiency, Fractures), B12 and Folate Panel, celecoxib (CELEBREX) 200 MG capsule  Fibromyalgia syndrome - Plan: celecoxib (CELEBREX) 200 MG capsule, DULoxetine (CYMBALTA) 60 MG capsule  Assessment and Plan    Fibromyalgia Generalized body pain consistent with previous episodes. Concerns about Cymbalta efficacy or if she has having breakthrough flare causing symptoms. - Check inflammatory markers to rule out other inflammatory conditions previous  with nonspecific inflammatory markers and mildly positive RNP at 1.2. -Continue Cymbalta 60 mg daily  Peripheral neuropathy Pins and needles sensation in feet, progressing to numbness. Differential includes vitamin B12 or folate deficiency, circulatory disorder, or inflammatory process such as axonal or small fiber neuropathy. - Order blood tests for vitamin B12, folate, and other markers to assess vitamin absorption.  Knee pain and swelling Knee pain and swelling, especially in the right knee, with episodes of locking. Significant discomfort at night. - Continue Celebrex twice daily. - Evaluate inflammatory markers for underlying conditions.  Vitamin D deficiency Vitamin D deficiency despite supplementation whenever she comes off of the weekly 50,000 units and converts back to a daily over-the-counter.  Her requirement should not be high as she is not overweight.  Suggests possible malabsorption.  Rechecking levels at the keep decreasing she might also benefit just from sticking with a higher than usual daily supplement. - Monitor vitamin D levels and assess for malabsorption.    Orders: Orders Placed This Encounter  Procedures   Sedimentation rate   C-reactive protein   Comprehensive metabolic panel with GFR   VITAMIN D 25 Hydroxy (Vit-D Deficiency, Fractures)   B12 and Folate Panel   Meds ordered this encounter  Medications   celecoxib (CELEBREX) 200 MG capsule    Sig: Take 1 capsule (200 mg total) by mouth 2 (two) times daily as needed.    Dispense:  90 capsule    Refill:  1   DULoxetine (CYMBALTA) 60 MG capsule    Sig: Take 1 capsule (60 mg total) by mouth daily.    Dispense:  90 capsule    Refill:  1     Follow-Up Instructions: Return in about 6 months (around 08/22/2024) for  OA/FMS on CYM/COX f/u 6mos.   Fuller Plan, MD  Note - This record has been created using [employer].  Chart creation errors have been sought, but may not always  have been located.  Such creation errors do not reflect on  the standard of medical care.

## 2024-02-20 ENCOUNTER — Ambulatory Visit: Payer: BC Managed Care – PPO | Attending: Internal Medicine | Admitting: Internal Medicine

## 2024-02-20 ENCOUNTER — Encounter: Payer: Self-pay | Admitting: Internal Medicine

## 2024-02-20 VITALS — BP 115/76 | HR 72 | Ht 65.0 in | Wt 146.0 lb

## 2024-02-20 DIAGNOSIS — G5603 Carpal tunnel syndrome, bilateral upper limbs: Secondary | ICD-10-CM

## 2024-02-20 DIAGNOSIS — M25562 Pain in left knee: Secondary | ICD-10-CM

## 2024-02-20 DIAGNOSIS — M797 Fibromyalgia: Secondary | ICD-10-CM

## 2024-02-20 DIAGNOSIS — M25561 Pain in right knee: Secondary | ICD-10-CM | POA: Diagnosis not present

## 2024-02-20 DIAGNOSIS — G8929 Other chronic pain: Secondary | ICD-10-CM

## 2024-02-20 DIAGNOSIS — G6289 Other specified polyneuropathies: Secondary | ICD-10-CM

## 2024-02-20 MED ORDER — CELECOXIB 200 MG PO CAPS: 200.0000 mg | ORAL_CAPSULE | Freq: Two times a day (BID) | ORAL | 1 refills | Status: DC | PRN

## 2024-02-20 MED ORDER — DULOXETINE HCL 60 MG PO CPEP: 60.0000 mg | ORAL_CAPSULE | Freq: Every day | ORAL | 1 refills | Status: AC

## 2024-02-21 LAB — B12 AND FOLATE PANEL
Folate: 8.2 ng/mL
Vitamin B-12: 597 pg/mL (ref 200–1100)

## 2024-02-21 LAB — COMPREHENSIVE METABOLIC PANEL WITH GFR
AG Ratio: 1.8 (calc) (ref 1.0–2.5)
ALT: 14 U/L (ref 6–29)
AST: 17 U/L (ref 10–35)
Albumin: 4.2 g/dL (ref 3.6–5.1)
Alkaline phosphatase (APISO): 73 U/L (ref 31–125)
BUN: 8 mg/dL (ref 7–25)
CO2: 26 mmol/L (ref 20–32)
Calcium: 9.8 mg/dL (ref 8.6–10.2)
Chloride: 105 mmol/L (ref 98–110)
Creat: 0.76 mg/dL (ref 0.50–0.99)
Globulin: 2.4 g/dL (ref 1.9–3.7)
Glucose, Bld: 89 mg/dL (ref 65–99)
Potassium: 4.6 mmol/L (ref 3.5–5.3)
Sodium: 139 mmol/L (ref 135–146)
Total Bilirubin: 0.5 mg/dL (ref 0.2–1.2)
Total Protein: 6.6 g/dL (ref 6.1–8.1)
eGFR: 98 mL/min/{1.73_m2} (ref >=60)

## 2024-02-21 LAB — VITAMIN D 25 HYDROXY (VIT D DEFICIENCY, FRACTURES): Vit D, 25-Hydroxy: 82 ng/mL (ref 30–100)

## 2024-02-21 LAB — SEDIMENTATION RATE: Sed Rate: 2 mm/h (ref 0–20)

## 2024-02-21 LAB — C-REACTIVE PROTEIN: CRP: <3 mg/L (ref <8.0)

## 2024-03-12 DIAGNOSIS — G629 Polyneuropathy, unspecified: Secondary | ICD-10-CM | POA: Insufficient documentation

## 2024-03-30 ENCOUNTER — Ambulatory Visit: Payer: BC Managed Care – PPO | Admitting: Physician Assistant

## 2024-03-30 VITALS — BP 110/80 | HR 82 | Temp 98.2°F | Resp 18 | Ht 65.0 in | Wt 144.8 lb

## 2024-03-30 DIAGNOSIS — E559 Vitamin D deficiency, unspecified: Secondary | ICD-10-CM

## 2024-03-30 DIAGNOSIS — R000 Tachycardia, unspecified: Secondary | ICD-10-CM

## 2024-03-30 DIAGNOSIS — R42 Dizziness and giddiness: Secondary | ICD-10-CM

## 2024-03-30 DIAGNOSIS — E782 Mixed hyperlipidemia: Secondary | ICD-10-CM | POA: Diagnosis not present

## 2024-03-30 NOTE — Progress Notes (Signed)
 Subjective:  Patient ID: Sarah [name], female    DOB: [birth year]  Age: 48 y.o. MRN: [id number]  Chief Complaint  Patient presents with   Medical Management of Chronic Issues    HPI  Pt in for follow up of fibromyalgia - she states she is doing well on cymbalta  60mg  daily  She also uses celebrex  200mg  qd  Pt with history of vit D def and has been on weekly supplements - due for labwork  Pt with history of GERD and hiatal hernia - stable on pepcid 40 and protonix  40mg   Pt states that over the past month she has had episodes of intermittent dizziness and feeling like her heart is racing.  When she bends over she feels slight nausea and dizziness.  She denies chest pain or dyspnea.   Current Outpatient Medications on File Prior to Visit  Medication Sig Dispense Refill   celecoxib  (CELEBREX ) 200 MG capsule Take 1 capsule (200 mg total) by mouth 2 (two) times daily as needed. 90 capsule 1   dicyclomine (BENTYL) 10 MG capsule Take 10 mg by mouth every 6 (six) hours as needed.     DULoxetine  (CYMBALTA ) 60 MG capsule Take 1 capsule (60 mg total) by mouth daily. 90 capsule 1   famotidine (PEPCID) 40 MG tablet Take 40 mg by mouth 2 (two) times daily.     pantoprazole  (PROTONIX ) 40 MG tablet TAKE 1 TABLET BY MOUTH EVERY DAY 90 tablet 1   Vitamin D , Ergocalciferol , (DRISDOL ) 1.25 MG (50000 UNIT) CAPS capsule Take 1 capsule (50,000 Units total) by mouth every 7 (seven) days. 5 capsule 5   No current facility-administered medications on file prior to visit.   Past Medical History:  Diagnosis Date   Carpal tunnel syndrome    Fibromyalgia    GERD (gastroesophageal reflux disease)    Hernia, hiatal    Hypercalcemia 01/06/2021   Left breast mass 01/26/2021   Liver cyst    Malaise 01/06/2021   Need for tetanus, diphtheria, and acellular pertussis (Tdap) vaccine 01/06/2021   Palpitations 01/26/2021   Polyuria 01/06/2021   Scoliosis    Weight loss 01/02/2021   Past Surgical History:   Procedure Laterality Date   CARPAL TUNNEL RELEASE Right 07/01/2023   Procedure: right carpal tunnel release;  Surgeon: Brunilda Capra, MD;  Location: [facility];  Service: Orthopedics;  Laterality: Right;   CARPAL TUNNEL RELEASE Left 11/11/2023   Procedure: LEFT CARPAL TUNNEL RELEASE;  Surgeon: Brunilda Capra, MD;  Location: [facility];  Service: Orthopedics;  Laterality: Left;  30 MIN   CERVICAL SPINE SURGERY     Left fooscrew and plate N6E      TUBAL LIGATION      Family History  Problem Relation Age of Onset   Heart disease Mother    Hyperlipidemia Mother    Anxiety disorder Sister    GER disease Son    Asthma Son    GER disease Son    Heart disease Son    Social History   Socioeconomic History   Marital status: Single    Spouse name: Not on file   Number of children: Not on file   Years of education: Not on file   Highest education level: Some college, no degree  Occupational History   Not on file  Tobacco Use   Smoking status: Every Day    Current packs/day: 1.00    Average packs/day: 1 pack/day for 10.0 years (10.0 ttl pk-yrs)  Types: Cigarettes    Passive exposure: Never   Smokeless tobacco: Never  Vaping Use   Vaping status: Never Used  Substance and Sexual Activity   Alcohol use: Not Currently   Drug use: Never   Sexual activity: Not Currently  Other Topics Concern   Not on file  Social History Narrative   Not on file   Social Drivers of Health   Financial Resource Strain: Low Risk  (03/30/2024)   Overall Financial Resource Strain (CARDIA)    Difficulty of Paying Living Expenses: Not very hard  Food Insecurity: No Food Insecurity (03/30/2024)   Hunger Vital Sign    Worried About Running Out of Food in the Last Year: Never true    Ran Out of Food in the Last Year: Never true  Transportation Needs: No Transportation Needs (03/30/2024)   PRAPARE - Administrator, Civil Service (Medical): No    Lack of Transportation  (Non-Medical): No  Physical Activity: Insufficiently Active (03/30/2024)   Exercise Vital Sign    Days of Exercise per Week: 1 day    Minutes of Exercise per Session: 40 min  Stress: No Stress Concern Present (03/30/2024)   [employer] of Occupational Health - Occupational Stress Questionnaire    Feeling of Stress : Not at all  Social Connections: Socially Isolated (03/30/2024)   Social Connection and Isolation Panel [NHANES]    Frequency of Communication with Friends and Family: Twice a week    Frequency of Social Gatherings with Friends and Family: Once a week    Attends Religious Services: Never    Database administrator or Organizations: No    Attends Engineer, structural: Not on file    Marital Status: Divorced   CONSTITUTIONAL: see HPI E/N/T: Negative for ear pain, nasal congestion and sore throat.  CARDIOVASCULAR: see HPI RESPIRATORY: Negative for recent cough and dyspnea.  GASTROINTESTINAL: Negative for abdominal pain, acid reflux symptoms, constipation, diarrhea, nausea and vomiting.  MSK: Negative for arthralgias and myalgias.  INTEGUMENTARY: Negative for rash.  NEUROLOGICAL: see HPI PSYCHIATRIC: Negative for sleep disturbance and to question depression screen.  Negative for depression, negative for anhedonia.        Objective:  PHYSICAL EXAM:   VS: BP 110/80 (BP Location: Left Arm, Patient Position: Standing, Cuff Size: Normal)   Pulse 82   Temp 98.2 F (36.8 C) (Temporal)   Resp 18   Ht 5\' 5"  (1.651 m)   Wt 144 lb 12.8 oz (65.7 kg)   SpO2 99%   BMI 24.10 kg/m  Orthostatic Vitals for the past 48 hrs (Last 6 readings):  Patient Position Orthostatic BP Orthostatic Pulse BP Pulse BP Location Cuff Size  03/30/24 0815 -- -- -- 102/66 77 -- --  03/30/24 0831 Supine 100/70 78 -- -- Left Arm Normal  03/30/24 0832 Sitting 110/80 80 -- -- Left Arm Normal  03/30/24 0833 Standing -- -- 110/80 82 Left Arm Normal     GEN: Well nourished, well developed, in no  acute distress  HEENT: normal external ears and nose - normal external auditory canals and TMS -  - Lips, Teeth and Gums - normal  Oropharynx - normal mucosa, palate, and posterior pharynx  Cardiac: RRR; no murmurs, rubs, or gallops,no edema - Respiratory:  normal respiratory rate and pattern with no distress - normal breath sounds with no rales, rhonchi, wheezes or rubs  Skin: warm and dry, no rash  Neuro:  Alert and Oriented x 3, - CN II-Xii  grossly intact Psych: euthymic mood, appropriate affect and demeanor  EKG - no acute changes Diabetic Foot Exam - Simple   No data filed      Lab Results  Component Value Date   WBC 5.1 09/27/2023   HGB 13.3 09/27/2023   HCT 39.7 09/27/2023   PLT 314 09/27/2023   GLUCOSE 89 02/20/2024   CHOL 220 (H) 03/18/2023   TRIG 87 03/18/2023   HDL 77 03/18/2023   LDLCALC 128 (H) 03/18/2023   ALT 14 02/20/2024   AST 17 02/20/2024   NA 139 02/20/2024   K 4.6 02/20/2024   CL 105 02/20/2024   CREATININE 0.76 02/20/2024   BUN 8 02/20/2024   CO2 26 02/20/2024   TSH 1.400 09/27/2023   INR 1.0 11/22/2020   HGBA1C 5.3 09/27/2023      Assessment & Plan:   Problem List Items Addressed This Visit       Digestive   Gastroesophageal reflux disease without esophagitis Continue meds     Other   Other fatigue   Relevant Orders   CBC with Differential/Platelet   Comprehensive metabolic panel   TSH with thyroid  panel   Fibromyalgia - Primary   Continue follow up with Dr Rodell Citrin      Tachycardia Labwork pending Referral to cardiology EKG  Dizziness Labwork pending Referral to cardiology  Vit D def Labwork pending Continue supplement           .  No orders of the defined types were placed in this encounter.   Orders Placed This Encounter  Procedures   CBC with Differential/Platelet   Comprehensive metabolic panel with GFR   Lipid panel   VITAMIN D  25 Hydroxy (Vit-D Deficiency, Fractures)   Thyroid  Panel With TSH   Ambulatory  referral to Cardiology   EKG 12-Lead     Follow-up: Return in about 6 months (around 09/30/2024) for chronic fasting follow-up.  An After Visit Summary was printed and given to the patient.  Anthonette Bastos [facility] [phone]

## 2024-03-31 ENCOUNTER — Encounter: Payer: Self-pay | Admitting: Physician Assistant

## 2024-03-31 LAB — CBC WITH DIFFERENTIAL/PLATELET
Basophils Absolute: 0 10*3/uL (ref 0.0–0.2)
Basos: 1 %
EOS (ABSOLUTE): 0.2 10*3/uL (ref 0.0–0.4)
Eos: 3 %
Hematocrit: 43.4 % (ref 34.0–46.6)
Hemoglobin: 14.5 g/dL (ref 11.1–15.9)
Immature Grans (Abs): 0 10*3/uL (ref 0.0–0.1)
Immature Granulocytes: 0 %
Lymphocytes Absolute: 1.4 10*3/uL (ref 0.7–3.1)
Lymphs: 26 %
MCH: 29.7 pg (ref 26.6–33.0)
MCHC: 33.4 g/dL (ref 31.5–35.7)
MCV: 89 fL (ref 79–97)
Monocytes Absolute: 0.3 10*3/uL (ref 0.1–0.9)
Monocytes: 6 %
Neutrophils Absolute: 3.4 10*3/uL (ref 1.4–7.0)
Neutrophils: 64 %
Platelets: 282 10*3/uL (ref 150–450)
RBC: 4.88 x10E6/uL (ref 3.77–5.28)
RDW: 12.9 % (ref 11.7–15.4)
WBC: 5.4 10*3/uL (ref 3.4–10.8)

## 2024-03-31 LAB — VITAMIN D 25 HYDROXY (VIT D DEFICIENCY, FRACTURES): Vit D, 25-Hydroxy: 46 ng/mL (ref 30.0–100.0)

## 2024-03-31 LAB — COMPREHENSIVE METABOLIC PANEL WITH GFR
ALT: 16 IU/L (ref 0–32)
AST: 19 IU/L (ref 0–40)
Albumin: 4.3 g/dL (ref 3.9–4.9)
Alkaline Phosphatase: 80 IU/L (ref 44–121)
BUN/Creatinine Ratio: 8 — ABNORMAL LOW (ref 9–23)
BUN: 6 mg/dL (ref 6–24)
Bilirubin Total: 0.3 mg/dL (ref 0.0–1.2)
CO2: 23 mmol/L (ref 20–29)
Calcium: 10.4 mg/dL — ABNORMAL HIGH (ref 8.7–10.2)
Chloride: 104 mmol/L (ref 96–106)
Creatinine, Ser: 0.77 mg/dL (ref 0.57–1.00)
Globulin, Total: 2.4 g/dL (ref 1.5–4.5)
Glucose: 82 mg/dL (ref 70–99)
Potassium: 5.2 mmol/L (ref 3.5–5.2)
Sodium: 140 mmol/L (ref 134–144)
Total Protein: 6.7 g/dL (ref 6.0–8.5)
eGFR: 96 mL/min/{1.73_m2} (ref >59)

## 2024-03-31 LAB — THYROID PANEL WITH TSH
Free Thyroxine Index: 1.7 (ref 1.2–4.9)
T3 Uptake Ratio: 23 % — ABNORMAL LOW (ref 24–39)
T4, Total: 7.4 ug/dL (ref 4.5–12.0)
TSH: 1.69 u[IU]/mL (ref 0.450–4.500)

## 2024-03-31 LAB — LIPID PANEL
Chol/HDL Ratio: 3.4 ratio (ref 0.0–4.4)
Cholesterol, Total: 231 mg/dL — ABNORMAL HIGH (ref 100–199)
HDL: 67 mg/dL (ref >39)
LDL Chol Calc (NIH): 148 mg/dL — ABNORMAL HIGH (ref 0–99)
Triglycerides: 90 mg/dL (ref 0–149)
VLDL Cholesterol Cal: 16 mg/dL (ref 5–40)

## 2024-05-14 ENCOUNTER — Ambulatory Visit (INDEPENDENT_AMBULATORY_CARE_PROVIDER_SITE_OTHER): Admitting: Physician Assistant

## 2024-05-14 ENCOUNTER — Encounter: Payer: Self-pay | Admitting: Physician Assistant

## 2024-05-14 VITALS — BP 100/78 | HR 91 | Temp 97.6°F | Ht 65.0 in | Wt 139.4 lb

## 2024-05-14 DIAGNOSIS — K648 Other hemorrhoids: Secondary | ICD-10-CM | POA: Diagnosis not present

## 2024-05-14 DIAGNOSIS — K580 Irritable bowel syndrome with diarrhea: Secondary | ICD-10-CM | POA: Diagnosis not present

## 2024-05-14 MED ORDER — HYDROCORTISONE ACETATE 25 MG RE SUPP: 25.0000 mg | Freq: Two times a day (BID) | RECTAL | 2 refills | Status: AC

## 2024-05-14 MED ORDER — DICYCLOMINE HCL 10 MG PO CAPS: 10.0000 mg | ORAL_CAPSULE | Freq: Three times a day (TID) | ORAL | 1 refills | Status: DC

## 2024-05-14 NOTE — Progress Notes (Signed)
   Acute Office Visit  Subjective:    Patient ID: Sarah [name], female    DOB: Jul 17, 1976, 48 y.o.   MRN: [id number]  Chief Complaint  Patient presents with   Hemorrhoids    HPI: Patient is in today for complaints of IBS-D and internal hemorrhoids.  She has seen GI in the past and up to date on colonoscopy.  She requests refill of dicyclomine and rx for hemorrhoids She denies fever/malaise.  She has been out of medication for a few weeks and that is why symptoms flared up   Current Outpatient Medications:    celecoxib  (CELEBREX ) 200 MG capsule, Take 1 capsule (200 mg total) by mouth 2 (two) times daily as needed., Disp: 90 capsule, Rfl: 1   DULoxetine  (CYMBALTA ) 60 MG capsule, Take 1 capsule (60 mg total) by mouth daily., Disp: 90 capsule, Rfl: 1   famotidine (PEPCID) 40 MG tablet, Take 40 mg by mouth 2 (two) times daily., Disp: , Rfl:    hydrocortisone (ANUSOL-HC) 25 MG suppository, Place 1 suppository (25 mg total) rectally 2 (two) times daily., Disp: 12 suppository, Rfl: 2   pantoprazole  (PROTONIX ) 40 MG tablet, TAKE 1 TABLET BY MOUTH EVERY DAY, Disp: 90 tablet, Rfl: 1   Vitamin D , Ergocalciferol , (DRISDOL ) 1.25 MG (50000 UNIT) CAPS capsule, Take 1 capsule (50,000 Units total) by mouth every 7 (seven) days., Disp: 5 capsule, Rfl: 5   dicyclomine (BENTYL) 10 MG capsule, Take 1 capsule (10 mg total) by mouth 3 (three) times daily before meals., Disp: 90 capsule, Rfl: 1  Allergies  Allergen Reactions   Meloxicam Other (See Comments)    eyesight, patient can take ibuprofen and Nsaids fine.  Eyesight, , eyesight, patient can take ibuprofen and Nsaids fine.    ROS CONSTITUTIONAL: Negative for chills, fatigue, fever,  CARDIOVASCULAR: Negative for chest pain,  RESPIRATORY: Negative for recent cough and dyspnea.  GASTROINTESTINAL: see HPI GU - negative     Objective:    PHYSICAL EXAM:   BP 100/78   Pulse 91   Temp 97.6 F (36.4 C)   Ht 5' 5 (1.651 m)   Wt 139 lb 6.4 oz  (63.2 kg)   SpO2 98%   BMI 23.20 kg/m    GEN: Well nourished, well developed, in no acute distress  Cardiac: RRR; no murmurs, Respiratory:  normal respiratory rate and pattern with no distress - normal breath sounds with no rales, rhonchi, wheezes or rubs GI: normal bowel sounds, no masses or tenderness     Assessment & Plan:    Irritable bowel syndrome with diarrhea -     Dicyclomine HCl; Take 1 capsule (10 mg total) by mouth 3 (three) times daily before meals.  Dispense: 90 capsule; Refill: 1  Internal hemorrhoids -     Hydrocortisone Acetate; Place 1 suppository (25 mg total) rectally 2 (two) times daily.  Dispense: 12 suppository; Refill: 2     Follow-up: Return for as scheduled for chronic visit.  An After Visit Summary was printed and given to the patient.  Anthonette Bastos [facility] [phone]

## 2024-05-24 ENCOUNTER — Other Ambulatory Visit: Payer: Self-pay | Admitting: Physician Assistant

## 2024-05-24 DIAGNOSIS — E559 Vitamin D deficiency, unspecified: Secondary | ICD-10-CM

## 2024-06-05 ENCOUNTER — Other Ambulatory Visit: Payer: Self-pay | Admitting: Physician Assistant

## 2024-06-05 DIAGNOSIS — K580 Irritable bowel syndrome with diarrhea: Secondary | ICD-10-CM

## 2024-06-21 ENCOUNTER — Other Ambulatory Visit: Payer: Self-pay | Admitting: Physician Assistant

## 2024-06-21 DIAGNOSIS — K219 Gastro-esophageal reflux disease without esophagitis: Secondary | ICD-10-CM

## 2024-06-24 ENCOUNTER — Other Ambulatory Visit: Payer: Self-pay | Admitting: Internal Medicine

## 2024-06-24 DIAGNOSIS — M797 Fibromyalgia: Secondary | ICD-10-CM

## 2024-06-24 NOTE — Telephone Encounter (Signed)
 Last Fill: 02/20/2024  Labs: 03/30/2024 BUN/Creatinine Ratio 8 Calcium 10.4  Next Visit: 08/27/2024  Last Visit: 02/20/2024  DX: Fibromyalgia   Current Dose per office note 02/20/2024: Celebrex  200 mg daily or twice daily as needed.   Okay to refill Celebrex ?

## 2024-08-18 NOTE — Progress Notes (Signed)
 Office Visit Note  Patient: Sarah [name]             Date of Birth: Dec 05, 1975           MRN: [id number]             PCP: Nicholaus Credit, PA-C Referring: Nicholaus Credit, PA-C Visit Date: 08/27/2024   Subjective:  Pain (Right leg , both knees, an both feet. Patient notices feet are starting to turn inward)   Discussed the use of AI scribe software for clinical note transcription with the patient, who gave verbal consent to proceed.  History of Present Illness   Sarah [name] is a 48 y.o. female here for follow up or osteoarthritis and fibromyalgia on cymbalta  60 mg and Celebrex  200 mg twice daily.   She experiences persistent, nagging pain in her right leg worst at thr hip and thigh. The pain is described as sore and is exacerbated by prolonged standing at work. The discomfort extends from her leg up to her hip, with notable soreness in the hip area.  Her feet are starting to turn in when she stands, which she attributes to possible knee rotation. Her knees frequently pop. She reports a sensation of imbalance, often running into her truck from the left side.  She has a history of high arches, with one arch crushed from a previous surgery. She has not seen a podiatrist before and denies any specific injuries to her hip. Despite the absence of recent trauma, her hip discomfort has been worsening over time.  No specific injuries to her hip or change in activity level.      Previous HPI 02/20/2024 Sarah [name] is a 48 y.o. female here for follow up for osteoarthritis and fibromyalgia on cymbalta  60 mg and Celebrex  200 mg twice daily.  She has now had carpal tunnel release surgery on both wrists with resolution of the associated finger pain and numbness.  Did have 1 injection in the left hand after that surgery.   She experiences numbness and tingling in the bottom of her feet, described as 'pins and needles,' which began a couple of months ago. These symptoms occur once or twice a week  and resolve spontaneously. There is no significant pain, but she describes the sensation as 'needles sticking' in her feet. Occasional swelling is noted in her ankles, with more frequent swelling in her knees.   She has a history of knee issues, reporting that her knees sometimes feel as though they are 'locking up,' particularly upon waking. This has been occurring for three to four months. She experiences significant pain during these episodes, which sometimes wake her at night. More swelling is noted in her right knee compared to her left. She continues to take Celebrex  twice daily and Cymbalta , which she has been on long-term.   She reports occasional dizziness and expresses concern about her body's overall pain, similar to when she first sought treatment for fibromyalgia. A glucose A1c test done by her family doctor was normal. She experiences difficulty rising from the floor, requiring her to use her hands and knees for support.   She mentions a history of stomach pain, which has improved since starting Bentyl  (dicyclomine ). Occasional nausea is present, but she reports regular bowel movements. She has been previously noted to have vitamin D  deficiency despite taking over-the-counter supplements. No recent illness, but she mentions upper respiratory symptoms with a productive cough in the mornings. No recent changes in her diet or significant  gastrointestinal symptoms aside from occasional nausea. No recent changes in her smoking status and denies numbness in her fingers.       Previous HPI 08/23/2023 Sarah [name] is a 48 y.o. female here for follow up for carpal tunnel syndrome and fibromyalgia on cymbalta  60 mg and Celebrex  200 mg twice daily.  Since our last visit she saw Dr. Murrell and had right wrist carpal tunnel release surgery on August 5.  So far she is still working on her postoperative rehab exercises still has some stiffness and decreased extension range of motion.  She has been out of  work since surgery for recovery and her day-to-day pain in hips and knees has mostly been better.  Currently experiencing an increase in knee pain worse on the right side with a small amount of swelling.  This has been coming and going lasting for days at a time and for several years.   Previous HPI 02/11/2023 Sarah [name] is a 48 y.o. female here for follow up for carpal tunnel syndrome and fibromyalgia on cymbalta  60 mg and recent wrist injections January. Recent GI illness diarrhea and not eating labs last Wednesday low albumin Thursday with low electrolytes. Getting more muscle cramping in both legs but not a full spasm or cramp like she has normally had in the past. Now eating well again since yesterday. Flexeril helps but only taking sometimes due to drowsiness.   Previous HPI 08/13/22 LULAMAE [name] is a 48 y.o. female here for follow up with joint pains and myofascial pain on cymbalta  60 mg daily and celebrex  200 mg daily or as needed. 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 She notices some shakiness or tremor in her hands that might be increased. Neck pain and muscle soreness. She has some elbow pains and there is a rash on her left elbow just in the past week or two. She also is having pain around her right hip and radiating into the right leg sometimes often after standing from driving for 69-59 minutes.   Previous HPI 12/05/22 Patient presented today for bilateral ultrasound-guided carpal tunnel steroid injection due to progressively worsening hand pain and numbness again since November of last year.  Reviewed previous procedure which which we did in June after which she initially had complete improvement in symptoms before they started to return.  Tolerated the previous injections with no adverse effect.   Injections performed today under ultrasound guidance both sides.   Patient experienced a brief vagal episode which recovered after approximately 10 minutes waiting.  Provided printed range of motion exercises for carpal and cubital tunnel symptoms.   02/08/2022  Shavontae A Nevitt is a 48 y.o. female here for follow up with joint pains and myofascial pain after starting cymbalta  and titrate to 60 mg and with celebrex  200 mg BID. She noticed an improvement in pain of both legs after increasing the cymbalta  dose to 60 mg. She has intermittent hand numbness continuing to come and go in both hands, bothering her a bit more than before. She reports one incident of using too hot water for her son with inability to feel temperature.   Previous HPI 11/09/21 NOHELI MELDER is a 48 y.o. female here for follow up with multiple symptoms including joint pains, fatigue, numbness, and unintentional weight loss with positive ANA serology. Labs at initial visit showing positive RNP 1.5  otherwise normal CK norma complements low ANA titer 1:40. She continues having ongoing symptoms with pain all over worst in legs at the moment and very fatigued.   Previous HPI 10/12/21 JENNIFIER SMITHERMAN is a 48 y.o. female here for joint pains, numbness, fatigue, and weight loss with positive ANA testing.  Symptoms have really been ongoing since last year no specific onset or proceeding infections or medical events that she can recall.  Initially this was accompanied by a generalized body aches and fatigue as well as a progressive unexplained weight loss from her baseline down to as low as 105 pounds.  She has had some amount of chronic joint pain previous evaluations of bilateral rotator cuff arthropathy, mild degenerative disc disease in the lumbar spine, previous right leg fracture with residual swelling intermittently at the ankle and knee.  However these were increased additionally with problems of a lot of stiffness and pain in the morning and getting up from stationary positions particularly in  bilateral hips. Particularly notices pain whenever she has to drive for an hour or longer at a time.  These have improved a lot on Celebrex  but she feels symptoms quickly worsen again if off the medication. More recently she is also started having muscle cramping particularly in her legs not associated with any particular position or activity.  She also reports numbness and tingling sensation particularly in bilateral hands this occurs overnight and first thing in the morning as well as periodically throughout the day.  She had nerve conduction study for this reportedly showing minimal carpal tunnel syndrome not felt to adequately explain symptoms.  She tried wearing wrist braces for this with no symptom improvement.  Throughout that time she denied any loss of appetite only gastrointestinal symptom was increase in loose frequent stools not associated with any pain or bleeding.  She described frequent nighttime awakening often every 1-2 hours without specific causes and has severe fatigue and daytime somnolence able to fall asleep unintentionally whenever she stops moving. She denies new hair loss, oral ulcers, raynaud's symptoms, lymphadenopathy, or history of blood clots. She reports dry mouth and dry skin symptoms but no problems with her eyes. 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 She has headaches usually bilateral lasting for up to a few hours at a time. She has urinary urgency with occasional urge incontinence. She does not report allodynia symptoms.   LAbs reviewed 08/2021 ANA pos RF neg CCP neg ESR 3 CRP <1 TSH 2.54 CBC wnl CMP Ca 10.4   Imaging reviewed 04/21/21 MR Cardiac stress test IMPRESSION: 1.  Normal stress perfusion 2.  No evidence of cardiac amyloidosis 3.  Normal LV size, wall thickness, and systolic function (EF 62%) 4.  Normal RV size and systolic function (EF 69%) 5.  No late gadolinium enhancement to  suggest myocardial scar   Review of Systems  Constitutional:  Positive for fatigue.  HENT:  Positive for mouth dryness. Negative for mouth sores.   Eyes:  Positive for dryness.  Respiratory:  Negative for shortness of breath.   Cardiovascular:  Positive for palpitations and swelling in legs/feet. Negative for chest pain.  Gastrointestinal:  Positive for nausea. Negative for blood in stool, constipation and diarrhea.  Endocrine: Positive for increased urination.  Genitourinary:  Positive for involuntary urination.  Musculoskeletal:  Positive for joint pain, gait problem, joint pain, joint swelling, myalgias, morning stiffness, muscle tenderness and myalgias. Negative for muscle weakness.  Skin:  Positive for color change, rash and hair loss. Negative for sensitivity to sunlight.  Allergic/Immunologic: Positive for susceptible to infections.  Neurological:  Positive for dizziness and headaches.  Hematological:  Negative for swollen glands.  Psychiatric/Behavioral:  Negative for depressed mood and sleep disturbance. The patient is not nervous/anxious.     PMFS History:  Patient Active Problem List   Diagnosis Date Noted   Peripheral neuropathy 03/12/2024   Bilateral knee pain 08/23/2023   Flank pain 03/18/2023   Family history of elevated blood lipids 03/18/2023   Screening for colon cancer 09/10/2022   Gastroesophageal reflux disease without esophagitis 09/10/2022   Needs flu shot 09/10/2022   Vitamin D  deficiency 08/13/2022   Pain in right hip 08/13/2022   Facial rash 02/08/2022   Fibromyalgia 11/09/2021   Fibromyalgia syndrome 11/09/2021   Positive ANA (antinuclear antibody) 10/12/2021   Impingement syndrome of right shoulder 07/07/2021   Labral tear of shoulder, degenerative, right 07/07/2021   Carpal tunnel syndrome, bilateral 07/07/2021   DDD (degenerative disc disease), cervical 07/07/2021   Other fatigue 05/08/2021   Leg cramps 05/08/2021   Frequency of urination  05/08/2021   Irregular menses 05/08/2021   Scoliosis    Palpitations 01/26/2021   Left breast mass 01/26/2021   Polyuria 01/06/2021   Hypercalcemia 01/06/2021   Malaise 01/06/2021   Need for tetanus, diphtheria, and acellular pertussis (Tdap) vaccine 01/06/2021   Weight loss 01/02/2021   Hyperglycemia 01/02/2021    Past Medical History:  Diagnosis Date   Carpal tunnel syndrome    Fibromyalgia    GERD (gastroesophageal reflux disease)    Hernia, hiatal    Hypercalcemia 01/06/2021   Left breast mass 01/26/2021   Liver cyst    Malaise 01/06/2021   Need for tetanus, diphtheria, and acellular pertussis (Tdap) vaccine 01/06/2021   Palpitations 01/26/2021   Polyuria 01/06/2021   Scoliosis    Weight loss 01/02/2021    Family History  Problem Relation Age of Onset   Heart disease Mother    Hyperlipidemia Mother    Anxiety disorder Sister    GER disease Son    Asthma Son    GER disease Son    Heart disease Son    Past Surgical History:  Procedure Laterality Date   CARPAL TUNNEL RELEASE Right 07/01/2023   Procedure: right carpal tunnel release;  Surgeon: Murrell Drivers, MD;  Location: [facility];  Service: Orthopedics;  Laterality: Right;   CARPAL TUNNEL RELEASE Left 11/11/2023   Procedure: LEFT CARPAL TUNNEL RELEASE;  Surgeon: Murrell Drivers, MD;  Location: [facility];  Service: Orthopedics;  Laterality: Left;  30 MIN   CERVICAL SPINE SURGERY     Left fooscrew and plate [id number]      TUBAL LIGATION     Social History   Social History Narrative   Not on file   Immunization History  Administered Date(s) Administered   Influenza, Seasonal, Injecte, Preservative Fre 09/24/2023   Influenza,inj,Quad PF,6+ Mos 09/10/2022   PFIZER(Purple Top)SARS-COV-2 Vaccination 06/28/2020, 07/19/2020   Tdap 01/06/2021     Objective: Vital Signs: BP 109/66   Pulse 68   Temp 98.1 F (36.7 C)   Resp 16   Ht 5' 5 (1.651 m)   Wt 138 lb (62.6 kg)   LMP 06/26/2024  (Approximate)   BMI 22.96 kg/m    Physical Exam Eyes:     Conjunctiva/sclera: Conjunctivae normal.  Cardiovascular:     Rate and Rhythm: Normal rate and regular rhythm.  Pulmonary:  Effort: Pulmonary effort is normal.     Breath sounds: Normal breath sounds.  Skin:    General: Skin is warm and dry.  Neurological:     Mental Status: She is alert.  Psychiatric:        Mood and Affect: Mood normal.      Musculoskeletal Exam:  Shoulders full ROM no tenderness or swelling Elbows full ROM no tenderness or swelling Wrists full ROM no tenderness or swelling Fingers full ROM no tenderness or swelling Tightness in internal rotation of the right hip. Right hip moves easily in external rotation. Tenderness to pressure on lateral and anterior hip and upper thigh Knees full ROM no tenderness or swelling Ankles full ROM no tenderness or swelling   Investigation: No additional findings.  Imaging: No results found.  Recent Labs: Lab Results  Component Value Date   WBC 5.4 03/30/2024   HGB 14.5 03/30/2024   PLT 282 03/30/2024   NA 140 03/30/2024   K 5.2 03/30/2024   CL 104 03/30/2024   CO2 23 03/30/2024   GLUCOSE 82 03/30/2024   BUN 6 03/30/2024   CREATININE 0.77 03/30/2024   BILITOT 0.3 03/30/2024   ALKPHOS 80 03/30/2024   AST 19 03/30/2024   ALT 16 03/30/2024   PROT 6.7 03/30/2024   ALBUMIN 4.3 03/30/2024   CALCIUM 10.4 (H) 03/30/2024   GFRAA 115 01/06/2021    Speciality Comments: No specialty comments available.  Procedures:  No procedures performed Allergies: Meloxicam   Assessment / Plan:     Visit Diagnoses: Fibromyalgia - Cymbalta  60 mg daily agree with continuing Celebrex  200 mg daily or twice daily as needed - Continue cymbalta  60 mg daily - Continue celebrex  1-2 times daily PRN  Right hip muscle weakness and pain Chronic right hip muscle weakness and pain, likely due to muscular imbalance rather than joint pathology. Differential includes muscular  versus joint etiology, with a higher suspicion of muscular involvement. - Order hip X-ray to rule out joint pathology. - Provide exercises and stretches to strengthen hip flexors and internal rotators and stretch extensors and external rotators. - Consider referral to physical therapy for targeted exercise program.   Orders: Orders Placed This Encounter  Procedures   XR HIP UNILAT W OR W/O PELVIS 2-3 VIEWS RIGHT   No orders of the defined types were placed in this encounter.    Follow-Up Instructions: Return in about 6 months (around 02/25/2025) for FMS/OA on CYM/COX f/u 6mos.   Lonni LELON Ester, MD  Note - This record has been created using [employer].  Chart creation errors have been sought, but may not always  have been located. Such creation errors do not reflect on  the standard of medical care.

## 2024-08-27 ENCOUNTER — Encounter: Payer: Self-pay | Admitting: Internal Medicine

## 2024-08-27 ENCOUNTER — Ambulatory Visit: Attending: Internal Medicine

## 2024-08-27 ENCOUNTER — Ambulatory Visit: Attending: Internal Medicine | Admitting: Internal Medicine

## 2024-08-27 VITALS — BP 109/66 | HR 68 | Temp 98.1°F | Resp 16 | Ht 65.0 in | Wt 138.0 lb

## 2024-08-27 DIAGNOSIS — M25551 Pain in right hip: Secondary | ICD-10-CM | POA: Diagnosis not present

## 2024-08-27 DIAGNOSIS — M797 Fibromyalgia: Secondary | ICD-10-CM | POA: Diagnosis not present

## 2024-08-27 NOTE — Patient Instructions (Signed)
 Hip rotation  Lie on your back on a firm surface. With your left / right hand, gently pull your left / right knee toward the shoulder that is on the same side of the body. Stop when your knee is pointing toward the ceiling. Hold your left / right ankle with your other hand. Keeping your knee steady, gently pull your left / right ankle toward your other shoulder until you feel a stretch in your butt. Keep your hips and shoulders firmly planted while you do this stretch. Hold this position for __________ seconds. Repeat __________ times. Complete this exercise __________ times a day. Seated stretch This exercise is sometimes called hamstrings and adductors stretch. Sit on the floor with your legs stretched wide. Keep your knees straight during this exercise. Keeping your head and back in a straight line, bend at your waist to reach for your left foot (position A). You should feel a stretch in your right inner thigh (adductors). Hold this position for __________ seconds. Then slowly return to the upright position. Keeping your head and back in a straight line, bend at your waist to reach forward (position B). You should feel a stretch behind both of your thighs and knees (hamstrings). Hold this position for __________ seconds. Then slowly return to the upright position. Keeping your head and back in a straight line, bend at your waist to reach for your right foot (position C). You should feel a stretch in your left inner thigh (adductors). Hold this position for __________ seconds. Then slowly return to the upright position. Repeat __________ times. Complete this exercise __________ times a day. Lunge This exercise stretches the muscles of the hip (hip flexors). Place your left / right knee on the floor and bend your other knee so that is directly over your ankle. You should be half-kneeling. Keep good posture with your head over your shoulders. Tighten your butt muscles to point your tailbone  downward. This will prevent your back from arching too much. You should feel a gentle stretch in the front of your left / right thigh and hip. If you do not feel a stretch, slide your other foot forward slightly and then slowly lunge forward with your chest up until your knee once again lines up over your ankle. Make sure your tailbone continues to point downward. Hold this position for __________ seconds. Slowly return to the starting position. Repeat __________ times. Complete this exercise __________ times a day. Strengthening exercises These exercises build strength and endurance in your hip. Endurance is the ability to use your muscles for a long time, even after they get tired. Bridge This exercise strengthens the muscles of your hip (hip extensors). Lie on your back on a firm surface with your knees bent and your feet flat on the floor. Tighten your butt muscles and lift your bottom off the floor until the trunk of your body and your hips are level with your thighs. Do not arch your back. You should feel the muscles working in your butt and the back of your thighs. If you do not feel these muscles, slide your feet 1-2 inches (2.5-5 cm) farther away from your butt. Hold this position for __________ seconds. Slowly lower your hips to the starting position. Let your muscles relax completely between repetitions. Repeat __________ times. Complete this exercise __________ times a day. Straight leg raises, side-lying This exercise strengthens the muscles that move the hip joint away from the center of the body (hip abductors). Lie on your side  with your left / right leg in the top position. Lie so your head, shoulder, hip, and knee line up. You may bend your bottom knee slightly to help you balance. Roll your hips slightly forward, so your hips are stacked directly over each other and your left / right knee is facing forward. Leading with your heel, lift your top leg 4-6 inches (10-15 cm). You  should feel the muscles in your top hip lifting. Do not let your foot drift forward. Do not let your knee roll toward the ceiling. Hold this position for __________ seconds. Slowly return to the starting position. Let your muscles relax completely between repetitions. Repeat __________ times. Complete this exercise __________ times a day. Straight leg raises, supine This exercise strengthens the muscles in the front of your thigh (quadriceps and hip flexors). Lie on your back (supine position) with your left / right leg extended and your other knee bent. Tense the muscles in the front of your left / right thigh. You should see your kneecap slide up or see increased dimpling just above your knee. Keep these muscles tight as you raise your leg 4-6 inches (10-15 cm) off the floor. Do not let your knee bend. Hold this position for __________ seconds. Keep these muscles tense as you lower your leg. Relax the muscles slowly and completely between repetitions. Repeat __________ times. Complete this exercise __________ times a day. Squats This exercise strengthens the muscles in the front of your thigh (quadriceps). Stand in front of a table, or stand in a doorframe so your feet and knees are in line with the frame. You may place your hands on the table or frame for balance. Slowly bend your knees and lower your hips like you are going to sit in a chair. Keep your lower legs in a straight up-and-down position. Do not let your hips go lower than your knees. Do not bend your knees lower than told by your provider. If your hip pain increases, do not bend as low. Hold this position for ___________ seconds. Slowly push with your legs to return to standing. Do not use your hands to pull yourself to standing. Repeat __________ times. Complete this exercise __________ times a day.

## 2024-08-28 ENCOUNTER — Ambulatory Visit (INDEPENDENT_AMBULATORY_CARE_PROVIDER_SITE_OTHER)

## 2024-08-28 ENCOUNTER — Encounter: Payer: Self-pay | Admitting: Cardiology

## 2024-08-28 ENCOUNTER — Ambulatory Visit: Attending: Cardiology | Admitting: Cardiology

## 2024-08-28 VITALS — BP 104/78 | HR 86 | Ht 65.0 in | Wt 134.0 lb

## 2024-08-28 DIAGNOSIS — R0602 Shortness of breath: Secondary | ICD-10-CM

## 2024-08-28 DIAGNOSIS — R42 Dizziness and giddiness: Secondary | ICD-10-CM

## 2024-08-28 NOTE — Progress Notes (Unsigned)
 Enrolled for [employer] to mail a ZIO XT long term holter monitor to [address], [place], [place]  [address].

## 2024-08-28 NOTE — Patient Instructions (Addendum)
 Medication Instructions:  Your physician recommends that you continue on your current medications as directed. Please refer to the Current Medication list given to you today.  *If you need a refill on your cardiac medications before your next appointment, please call your pharmacy*   Testing/Procedures: Pulmonary Function Tests Pulmonary function tests (PFTs) are breathing tests that are used to: Measure how well your lungs work. Find out what is causing your lung problems. Find the best treatment for you. You may have PFTs: If you have a condition that affects your lungs, such as asthma or chronic obstructive pulmonary disease (COPD). To watch for changes in your lung function over time if you have a long-term (chronic) lung disease. If you are an IT trainer. PFTs check the effects of being exposed to chemicals over a long period of time. To check lung function: Before having surgery or other procedures. If you smoke. To check if prescribed medicines or treatments are helping your lungs. Tell a health care provider about: Any allergies you have. All medicines you are taking, including inhaler or nebulizer medicines, vitamins, herbs, eye drops, creams, and over-the-counter medicines. Any bleeding problems you have. Any surgeries you have had, especially recent surgery of the eye, abdomen, or chest. These can make PFTs difficult or unsafe. Any medical conditions you have, including chest pain or heart problems, tuberculosis, or respiratory infections such as pneumonia, a cold, or the flu. Any fear of being in closed spaces (claustrophobia). Some of your tests may be in a closed space. What are the risks? Your health care provider will talk with you about risks. These may include: Feeling light-headed due to fast, deep breathing known as overbreathing or hyperventilation. An asthma attack from deep breathing. What happens before the test? Take over-the-counter and  prescription medicines only as told by your health care provider. If you take inhaler or nebulizer medicines, ask your health care provider which medicines you should take on the day of your testing. Some inhaler medicines may interfere with PFTs if they are taken shortly before the tests. Follow instructions from your health care provider about what you may eat and drink. These may include: Avoiding eating large meals. Avoiding using caffeine before the testing. Not drinkingalcohol for up to 4 hours before the test. Do not use any products that contain nicotine or tobacco for up to 4 hours before your test. These products include cigarettes, chewing tobacco, and vaping devices, such as e-cigarettes. These can affect your test results. If you need help quitting, ask your health care provider. Wear comfortable clothing that will not get in the way of your breathing. Avoid exercise that takes a lot of effort (strenuous exercise) for at least 30 minutes before the test. What happens during the test?  You will be given: A soft noseclip to wear. This allows all of your breaths to go through your mouth instead of your nose. A germ-free (sterile) mouthpiece. It will be attached to a spirometer machine that measures your breathing. You will be asked to do breathing exercises. The exercises will be done by breathing in (inhaling) and breathing out (exhaling). You may have to repeat the exercises many times before the testing is complete. You will need to follow instructions exactly as told to get accurate results. Make sure to blow as hard and as fast as you can when you are told to do so. You may be given a medicine called a bronchodilator. This makes the small air passages in your lungs larger  so you can breathe easier. The tests will be repeated after the medicine takes effect. You will be watched for any problems, such as feeling faint or dizzy, or having trouble breathing. The procedure may vary among  health care providers and hospitals. What can I expect after the test? Your results will be compared with the expected lung function of someone with healthy lungs who is similar to you in several ways. These ways include age, sex, height, weight, and race or ethnicity. This is done to show how your lung function compares with normal lung function (percent predicted). The percent predicted helps your health care provider know if your lung function is normal or not. If you have had PFTs done before, your health care provider will compare your current results with past results. This shows if your lung function is better, worse, or the same as before. It is up to you to get the results of your procedure. Ask your health care provider, or the department that is doing the procedure, when your results will be ready. After you get your results, talk with your health care provider about treatment options, if necessary. This information is not intended to replace advice given to you by your health care provider. Make sure you discuss any questions you have with your health care provider. Document Revised: 06/04/2022 Document Reviewed: 06/04/2022 Elsevier Patient Education  2024 [employer]   GEOFFRY HEWS- Long Term Monitor Instructions  Your physician has requested you wear a ZIO patch monitor for 14 days.  This is a single patch monitor. [employer] supplies one patch monitor per enrollment. Additional stickers are not available. Please do not apply patch if you will be having a Nuclear Stress Test,  Echocardiogram, Cardiac CT, MRI, or Chest Xray during the period you would be wearing the  monitor. The patch cannot be worn during these tests. You cannot remove and re-apply the  ZIO XT patch monitor.  Your ZIO patch monitor will be mailed 3 day USPS to your address on file. It may take 3-5 days  to receive your monitor after you have been enrolled.  Once you have received your monitor, please review the enclosed  instructions. Your monitor  has already been registered assigning a specific monitor serial # to you.  Billing and Patient Assistance Program Information  We have supplied [employer] with any of your insurance information on file for billing purposes. [employer] offers a sliding scale Patient Assistance Program for patients that do not have  insurance, or whose insurance does not completely cover the cost of the ZIO monitor.  You must apply for the Patient Assistance Program to qualify for this discounted rate.  To apply, please call [employer] at [phone], select option 4, select option 2, ask to apply for  Patient Assistance Program. Meredeth will ask your household income, and how many people  are in your household. They will quote your out-of-pocket cost based on that information.  [employer] will also be able to set up a 93-month, interest-free payment plan if needed.  Applying the monitor   Shave hair from upper left chest.  Hold abrader disc by orange tab. Rub abrader in 40 strokes over the upper left chest as  indicated in your monitor instructions.  Clean area with 4 enclosed alcohol pads. Let dry.  Apply patch as indicated in monitor instructions. Patch will be placed under collarbone on left  side of chest with arrow pointing upward.  Rub patch adhesive wings for 2 minutes. Remove white  label marked 1. Remove the white  label marked 2. Rub patch adhesive wings for 2 additional minutes.  While looking in a mirror, press and release button in center of patch. A small green light will  flash 3-4 times. This will be your only indicator that the monitor has been turned on.  Do not shower for the first 24 hours. You may shower after the first 24 hours.  Press the button if you feel a symptom. You will hear a small click. Record Date, Time and  Symptom in the Patient Logbook.  When you are ready to remove the patch, follow instructions on the last 2 pages of Patient  Logbook. Stick patch  monitor onto the last page of Patient Logbook.  Place Patient Logbook in the blue and white box. Use locking tab on box and tape box closed  securely. The blue and white box has prepaid postage on it. Please place it in the mailbox as  soon as possible. Your physician should have your test results approximately 7 days after the  monitor has been mailed back to [facility].  Call [facility] Customer Care at [phone] if you have questions regarding  your ZIO XT patch monitor. Call them immediately if you see an orange light blinking on your  monitor.  If your monitor falls off in less than 4 days, contact our Monitor department at [phone].  If your monitor becomes loose or falls off after 4 days call [employer] at [phone] for  suggestions on securing your monitor  Follow-Up: At [facility], you and your health needs are our priority.  As part of our continuing mission to provide you with exceptional heart care, our providers are all part of one team.  This team includes your primary Cardiologist (physician) and Advanced Practice Providers or APPs (Physician Assistants and Nurse Practitioners) who all work together to provide you with the care you need, when you need it.  Your next appointment:   16 week(s) Virtual Product/process development scientist)  Provider:   Kardie Tobb, DO

## 2024-08-28 NOTE — Progress Notes (Signed)
 Cardiology Office Note:    Date:  08/29/2024   ID:  Sarah [name] Balloon, DOB Oct 08, 1976, MRN [id number]  PCP:  Nicholaus Credit, PA-C  Cardiologist:  Ashlon Lottman, DO  Electrophysiologist:  None   Referring MD: Nicholaus Credit, PA-C    I am having shortness of breath, dizziness and palpitations  History of Present Illness:    Sarah [name] is a 48 y.o. female with a hx of left breast mass at that time when I saw her she was pending mammogram and ultrasound, fibromyalgia, GERD here today for follow-up visit.  Her last visit with me was in October 2022.  Discussed the use of AI scribe software for clinical note transcription with the patient, who gave verbal consent to proceed  She experiences dizziness and nausea, particularly when bending over and standing back up, accompanied by lightheadedness and increased palpitations. These symptoms have become more frequent. She also experiences shortness of breath during physical exertion, such as walking long distances or walking dogs at a shelter. She does smoke.   Past Medical History:  Diagnosis Date   Carpal tunnel syndrome    Fibromyalgia    GERD (gastroesophageal reflux disease)    Hernia, hiatal    Hypercalcemia 01/06/2021   Left breast mass 01/26/2021   Liver cyst    Malaise 01/06/2021   Need for tetanus, diphtheria, and acellular pertussis (Tdap) vaccine 01/06/2021   Palpitations 01/26/2021   Polyuria 01/06/2021   Scoliosis    Weight loss 01/02/2021    Past Surgical History:  Procedure Laterality Date   CARPAL TUNNEL RELEASE Right 07/01/2023   Procedure: right carpal tunnel release;  Surgeon: Murrell Drivers, MD;  Location: [facility];  Service: Orthopedics;  Laterality: Right;   CARPAL TUNNEL RELEASE Left 11/11/2023   Procedure: LEFT CARPAL TUNNEL RELEASE;  Surgeon: Murrell Drivers, MD;  Location: [facility];  Service: Orthopedics;  Laterality: Left;  30 MIN   CERVICAL SPINE SURGERY     Left fooscrew and  plate [id number]      TUBAL LIGATION      Current Medications: Current Meds  Medication Sig   celecoxib  (CELEBREX ) 200 MG capsule TAKE 1 CAPSULE (200 MG TOTAL) BY MOUTH 2 (TWO) TIMES DAILY AS NEEDED.   dicyclomine  (BENTYL ) 10 MG capsule TAKE 1 CAPSULE (10 MG TOTAL) BY MOUTH 3 (THREE) TIMES DAILY BEFORE MEALS.   DULoxetine  (CYMBALTA ) 60 MG capsule Take 1 capsule (60 mg total) by mouth daily.   famotidine (PEPCID) 40 MG tablet Take 40 mg by mouth 2 (two) times daily.   pantoprazole  (PROTONIX ) 40 MG tablet TAKE 1 TABLET BY MOUTH EVERY DAY   Vitamin D , Ergocalciferol , (DRISDOL ) 1.25 MG (50000 UNIT) CAPS capsule TAKE 1 CAPSULE (50,000 UNITS TOTAL) BY MOUTH EVERY 7 (SEVEN) DAYS     Allergies:   Meloxicam   Social History   Socioeconomic History   Marital status: Single    Spouse name: Not on file   Number of children: Not on file   Years of education: Not on file   Highest education level: Some college, no degree  Occupational History   Not on file  Tobacco Use   Smoking status: Every Day    Current packs/day: 1.00    Average packs/day: 1 pack/day for 10.0 years (10.0 ttl pk-yrs)    Types: Cigarettes    Passive exposure: Never   Smokeless tobacco: Never  Vaping Use   Vaping status: Never Used  Substance and Sexual Activity   Alcohol  use: Not Currently   Drug use: Never   Sexual activity: Not Currently  Other Topics Concern   Not on file  Social History Narrative   Not on file   Social Drivers of Health   Financial Resource Strain: Low Risk  (03/30/2024)   Overall Financial Resource Strain (CARDIA)    Difficulty of Paying Living Expenses: Not very hard  Food Insecurity: No Food Insecurity (03/30/2024)   Hunger Vital Sign    Worried About Running Out of Food in the Last Year: Never true    Ran Out of Food in the Last Year: Never true  Transportation Needs: No Transportation Needs (03/30/2024)   PRAPARE - Administrator, Civil Service (Medical): No    Lack of  Transportation (Non-Medical): No  Physical Activity: Insufficiently Active (03/30/2024)   Exercise Vital Sign    Days of Exercise per Week: 1 day    Minutes of Exercise per Session: 40 min  Stress: No Stress Concern Present (03/30/2024)   [employer] of Occupational Health - Occupational Stress Questionnaire    Feeling of Stress : Not at all  Social Connections: Socially Isolated (03/30/2024)   Social Connection and Isolation Panel    Frequency of Communication with Friends and Family: Twice a week    Frequency of Social Gatherings with Friends and Family: Once a week    Attends Religious Services: Never    Database administrator or Organizations: No    Attends Engineer, structural: Not on file    Marital Status: Divorced     Family History: The patient's family history includes Anxiety disorder in her sister; Asthma in her son; GER disease in her son and son; Heart disease in her mother and son; Hyperlipidemia in her mother.  ROS:   Review of Systems  Constitution: Negative for decreased appetite, fever and weight gain.  HENT: Negative for congestion, ear discharge, hoarse voice and sore throat.   Eyes: Negative for discharge, redness, vision loss in right eye and visual halos.  Cardiovascular: Negative for chest pain, dyspnea on exertion, leg swelling, orthopnea and palpitations.  Respiratory: Negative for cough, hemoptysis, shortness of breath and snoring.   Endocrine: Negative for heat intolerance and polyphagia.  Hematologic/Lymphatic: Negative for bleeding problem. Does not bruise/bleed easily.  Skin: Negative for flushing, nail changes, rash and suspicious lesions.  Musculoskeletal: Negative for arthritis, joint pain, muscle cramps, myalgias, neck pain and stiffness.  Gastrointestinal: Negative for abdominal pain, bowel incontinence, diarrhea and excessive appetite.  Genitourinary: Negative for decreased libido, genital sores and incomplete emptying.  Neurological:  Negative for brief paralysis, focal weakness, headaches and loss of balance.  Psychiatric/Behavioral: Negative for altered mental status, depression and suicidal ideas.  Allergic/Immunologic: Negative for HIV exposure and persistent infections.    EKGs/Labs/Other Studies Reviewed:    The following studies were reviewed today:   EKG:  The ekg ordered today demonstrates sinus rhythm heart rate 65 bpm  Recent Labs: 03/30/2024: ALT 16; BUN 6; Creatinine, Ser 0.77; Hemoglobin 14.5; Platelets 282; Potassium 5.2; Sodium 140; TSH 1.690  Recent Lipid Panel    Component Value Date/Time   CHOL 231 (H) 03/30/2024 0841   TRIG 90 03/30/2024 0841   HDL 67 03/30/2024 0841   CHOLHDL 3.4 03/30/2024 0841   LDLCALC 148 (H) 03/30/2024 0841    Physical Exam:    VS:  BP 104/78 (BP Location: Right Arm, Patient Position: Sitting, Cuff Size: Normal)   Pulse 86   Ht 5' 5 (1.651  m)   Wt 134 lb (60.8 kg)   LMP 06/26/2024 (Approximate)   SpO2 99%   BMI 22.30 kg/m     Wt Readings from Last 3 Encounters:  08/28/24 134 lb (60.8 kg)  08/27/24 138 lb (62.6 kg)  05/14/24 139 lb 6.4 oz (63.2 kg)     GEN: Well nourished, well developed in no acute distress HEENT: Normal NECK: No JVD; No carotid bruits LYMPHATICS: No lymphadenopathy CARDIAC: S1S2 noted,RRR, no murmurs, rubs, gallops RESPIRATORY:  Clear to auscultation without rales, wheezing or rhonchi  ABDOMEN: Soft, non-tender, non-distended, +bowel sounds, no guarding. EXTREMITIES: No edema, No cyanosis, no clubbing MUSCULOSKELETAL:  No deformity  SKIN: Warm and dry NEUROLOGIC:  Alert and oriented x 3, non-focal PSYCHIATRIC:  Normal affect, good insight  ASSESSMENT:    1. Dizziness   2. SOB (shortness of breath)    PLAN:   Dizziness, lightheadedness, and palpitations Dizziness and lightheadedness with positional changes, accompanied by nausea. Palpitations are more frequent and stronger. Previous cardiac monitoring was normal. Considering  cardiac and inner ear etiologies. - Order Zio monitor for cardiac evaluation. - If Zio monitor is normal, refer to ENT for further evaluation for the dizziness  Shortness of breath on exertion Exertional dyspnea with smoking history. Previous echocardiogram was normal. Considering pulmonary and cardiac causes. Prioritizing pulmonary evaluation to rule out COPD. - Order pulmonary function test. - If pulmonary function test is abnormal, will refer to pulmonologist  - If pulmonary function test is normal, consider repeating echocardiogram.  The patient is in agreement with the above plan. The patient left the office in stable condition.  The patient will follow up in   Medication Adjustments/Labs and Tests Ordered: Current medicines are reviewed at length with the patient today.  Concerns regarding medicines are outlined above.  Orders Placed This Encounter  Procedures   LONG TERM MONITOR (3-14 DAYS)   Pulmonary function test   No orders of the defined types were placed in this encounter.   Patient Instructions  Medication Instructions:  Your physician recommends that you continue on your current medications as directed. Please refer to the Current Medication list given to you today.  *If you need a refill on your cardiac medications before your next appointment, please call your pharmacy*   Testing/Procedures: Pulmonary Function Tests Pulmonary function tests (PFTs) are breathing tests that are used to: Measure how well your lungs work. Find out what is causing your lung problems. Find the best treatment for you. You may have PFTs: If you have a condition that affects your lungs, such as asthma or chronic obstructive pulmonary disease (COPD). To watch for changes in your lung function over time if you have a long-term (chronic) lung disease. If you are an IT trainer. PFTs check the effects of being exposed to chemicals over a long period of time. To check lung  function: Before having surgery or other procedures. If you smoke. To check if prescribed medicines or treatments are helping your lungs. Tell a health care provider about: Any allergies you have. All medicines you are taking, including inhaler or nebulizer medicines, vitamins, herbs, eye drops, creams, and over-the-counter medicines. Any bleeding problems you have. Any surgeries you have had, especially recent surgery of the eye, abdomen, or chest. These can make PFTs difficult or unsafe. Any medical conditions you have, including chest pain or heart problems, tuberculosis, or respiratory infections such as pneumonia, a cold, or the flu. Any fear of being in closed spaces (claustrophobia). Some of  your tests may be in a closed space. What are the risks? Your health care provider will talk with you about risks. These may include: Feeling light-headed due to fast, deep breathing known as overbreathing or hyperventilation. An asthma attack from deep breathing. What happens before the test? Take over-the-counter and prescription medicines only as told by your health care provider. If you take inhaler or nebulizer medicines, ask your health care provider which medicines you should take on the day of your testing. Some inhaler medicines may interfere with PFTs if they are taken shortly before the tests. Follow instructions from your health care provider about what you may eat and drink. These may include: Avoiding eating large meals. Avoiding using caffeine before the testing. Not drinkingalcohol for up to 4 hours before the test. Do not use any products that contain nicotine or tobacco for up to 4 hours before your test. These products include cigarettes, chewing tobacco, and vaping devices, such as e-cigarettes. These can affect your test results. If you need help quitting, ask your health care provider. Wear comfortable clothing that will not get in the way of your breathing. Avoid exercise that  takes a lot of effort (strenuous exercise) for at least 30 minutes before the test. What happens during the test?  You will be given: A soft noseclip to wear. This allows all of your breaths to go through your mouth instead of your nose. A germ-free (sterile) mouthpiece. It will be attached to a spirometer machine that measures your breathing. You will be asked to do breathing exercises. The exercises will be done by breathing in (inhaling) and breathing out (exhaling). You may have to repeat the exercises many times before the testing is complete. You will need to follow instructions exactly as told to get accurate results. Make sure to blow as hard and as fast as you can when you are told to do so. You may be given a medicine called a bronchodilator. This makes the small air passages in your lungs larger so you can breathe easier. The tests will be repeated after the medicine takes effect. You will be watched for any problems, such as feeling faint or dizzy, or having trouble breathing. The procedure may vary among health care providers and hospitals. What can I expect after the test? Your results will be compared with the expected lung function of someone with healthy lungs who is similar to you in several ways. These ways include age, sex, height, weight, and race or ethnicity. This is done to show how your lung function compares with normal lung function (percent predicted). The percent predicted helps your health care provider know if your lung function is normal or not. If you have had PFTs done before, your health care provider will compare your current results with past results. This shows if your lung function is better, worse, or the same as before. It is up to you to get the results of your procedure. Ask your health care provider, or the department that is doing the procedure, when your results will be ready. After you get your results, talk with your health care provider about treatment  options, if necessary. This information is not intended to replace advice given to you by your health care provider. Make sure you discuss any questions you have with your health care provider. Document Revised: 06/04/2022 Document Reviewed: 06/04/2022 Elsevier Patient Education  2024 Elsevier Inc.   GEOFFRY HEWS- Long Term Monitor Instructions  Your physician has requested you wear  a ZIO patch monitor for 14 days.  This is a single patch monitor. [employer] supplies one patch monitor per enrollment. Additional stickers are not available. Please do not apply patch if you will be having a Nuclear Stress Test,  Echocardiogram, Cardiac CT, MRI, or Chest Xray during the period you would be wearing the  monitor. The patch cannot be worn during these tests. You cannot remove and re-apply the  ZIO XT patch monitor.  Your ZIO patch monitor will be mailed 3 day USPS to your address on file. It may take 3-5 days  to receive your monitor after you have been enrolled.  Once you have received your monitor, please review the enclosed instructions. Your monitor  has already been registered assigning a specific monitor serial # to you.  Billing and Patient Assistance Program Information  We have supplied [employer] with any of your insurance information on file for billing purposes. [employer] offers a sliding scale Patient Assistance Program for patients that do not have  insurance, or whose insurance does not completely cover the cost of the ZIO monitor.  You must apply for the Patient Assistance Program to qualify for this discounted rate.  To apply, please call [employer] at [phone], select option 4, select option 2, ask to apply for  Patient Assistance Program. Meredeth will ask your household income, and how many people  are in your household. They will quote your out-of-pocket cost based on that information.  [employer] will also be able to set up a 44-month, interest-free payment plan if needed.  Applying the  monitor   Shave hair from upper left chest.  Hold abrader disc by orange tab. Rub abrader in 40 strokes over the upper left chest as  indicated in your monitor instructions.  Clean area with 4 enclosed alcohol pads. Let dry.  Apply patch as indicated in monitor instructions. Patch will be placed under collarbone on left  side of chest with arrow pointing upward.  Rub patch adhesive wings for 2 minutes. Remove white label marked 1. Remove the white  label marked 2. Rub patch adhesive wings for 2 additional minutes.  While looking in a mirror, press and release button in center of patch. A small green light will  flash 3-4 times. This will be your only indicator that the monitor has been turned on.  Do not shower for the first 24 hours. You may shower after the first 24 hours.  Press the button if you feel a symptom. You will hear a small click. Record Date, Time and  Symptom in the Patient Logbook.  When you are ready to remove the patch, follow instructions on the last 2 pages of Patient  Logbook. Stick patch monitor onto the last page of Patient Logbook.  Place Patient Logbook in the blue and white box. Use locking tab on box and tape box closed  securely. The blue and white box has prepaid postage on it. Please place it in the mailbox as  soon as possible. Your physician should have your test results approximately 7 days after the  monitor has been mailed back to [facility].  Call [facility] Customer Care at [phone] if you have questions regarding  your ZIO XT patch monitor. Call them immediately if you see an orange light blinking on your  monitor.  If your monitor falls off in less than 4 days, contact our Monitor department at [phone].  If your monitor becomes loose or falls off after 4 days call [employer] at [phone]  for  suggestions on securing your monitor  Follow-Up: At [facility], you and your health needs are our priority.  As part of our  continuing mission to provide you with exceptional heart care, our providers are all part of one team.  This team includes your primary Cardiologist (physician) and Advanced Practice Providers or APPs (Physician Assistants and Nurse Practitioners) who all work together to provide you with the care you need, when you need it.  Your next appointment:   16 week(s) Virtual (MyChart)  Provider:   Chaniqua Brisby, DO            Adopting a Healthy Lifestyle.  Know what a healthy weight is for you (roughly BMI <25) and aim to maintain this   Aim for 7+ servings of fruits and vegetables daily   65-80+ fluid ounces of water or unsweet tea for healthy kidneys   Limit to max 1 drink of alcohol per day; avoid smoking/tobacco   Limit animal fats in diet for cholesterol and heart health - choose grass fed whenever available   Avoid highly processed foods, and foods high in saturated/trans fats   Aim for low stress - take time to unwind and care for your mental health   Aim for 150 min of moderate intensity exercise weekly for heart health, and weights twice weekly for bone health   Aim for 7-9 hours of sleep daily   When it comes to diets, agreement about the perfect plan isnt easy to find, even among the experts. Experts at the [facility] of [employer] developed an idea known as the Healthy Eating Plate. Just imagine a plate divided into logical, healthy portions.   The emphasis is on diet quality:   Load up on vegetables and fruits - one-half of your plate: Aim for color and variety, and remember that potatoes dont count.   Go for whole grains - one-quarter of your plate: Whole wheat, barley, wheat berries, quinoa, oats, brown rice, and foods made with them. If you want pasta, go with whole wheat pasta.   Protein power - one-quarter of your plate: Fish, chicken, beans, and nuts are all healthy, versatile protein sources. Limit red meat.   The diet, however, does go beyond the  plate, offering a few other suggestions.   Use healthy plant oils, such as olive, canola, soy, corn, sunflower and peanut. Check the labels, and avoid partially hydrogenated oil, which have unhealthy trans fats.   If youre thirsty, drink water. Coffee and tea are good in moderation, but skip sugary drinks and limit milk and dairy products to one or two daily servings.   The type of carbohydrate in the diet is more important than the amount. Some sources of carbohydrates, such as vegetables, fruits, whole grains, and beans-are healthier than others.   Finally, stay active  Signed, Dub Huntsman, DO  08/29/2024 10:11 PM    [facility]

## 2024-09-02 ENCOUNTER — Encounter: Payer: Self-pay | Admitting: Internal Medicine

## 2024-09-02 DIAGNOSIS — M779 Enthesopathy, unspecified: Secondary | ICD-10-CM

## 2024-09-02 DIAGNOSIS — M25551 Pain in right hip: Secondary | ICD-10-CM

## 2024-09-02 DIAGNOSIS — M7071 Other bursitis of hip, right hip: Secondary | ICD-10-CM

## 2024-09-02 NOTE — Telephone Encounter (Signed)
 Hip xray looked good with no significant osteoarthritis or structural damage. I suspect the problem is primarily tendonitis or bursitis or both. I recommend we refer her to physical therapy for evaluation and treatment next if she agrees.

## 2024-09-14 ENCOUNTER — Ambulatory Visit

## 2024-09-21 ENCOUNTER — Telehealth: Payer: Self-pay

## 2024-09-21 NOTE — Therapy (Incomplete)
 OUTPATIENT PHYSICAL THERAPY LOWER EXTREMITY EVALUATION   Patient Name: Sarah [name] MRN: [id number] DOB:1975/12/07, 48 y.o., female Today's Date: 09/21/2024  END OF SESSION:   Past Medical History:  Diagnosis Date   Carpal tunnel syndrome    Fibromyalgia    GERD (gastroesophageal reflux disease)    Hernia, hiatal    Hypercalcemia 01/06/2021   Left breast mass 01/26/2021   Liver cyst    Malaise 01/06/2021   Need for tetanus, diphtheria, and acellular pertussis (Tdap) vaccine 01/06/2021   Palpitations 01/26/2021   Polyuria 01/06/2021   Scoliosis    Weight loss 01/02/2021   Past Surgical History:  Procedure Laterality Date   CARPAL TUNNEL RELEASE Right 07/01/2023   Procedure: right carpal tunnel release;  Surgeon: Murrell Drivers, MD;  Location: [facility];  Service: Orthopedics;  Laterality: Right;   CARPAL TUNNEL RELEASE Left 11/11/2023   Procedure: LEFT CARPAL TUNNEL RELEASE;  Surgeon: Murrell Drivers, MD;  Location: [facility];  Service: Orthopedics;  Laterality: Left;  30 MIN   CERVICAL SPINE SURGERY     Left fooscrew and plate k7u      TUBAL LIGATION     Patient Active Problem List   Diagnosis Date Noted   Peripheral neuropathy 03/12/2024   Bilateral knee pain 08/23/2023   Flank pain 03/18/2023   Family history of elevated blood lipids 03/18/2023   Screening for colon cancer 09/10/2022   Gastroesophageal reflux disease without esophagitis 09/10/2022   Needs flu shot 09/10/2022   Vitamin D  deficiency 08/13/2022   Pain in right hip 08/13/2022   Facial rash 02/08/2022   Fibromyalgia 11/09/2021   Fibromyalgia syndrome 11/09/2021   Positive ANA (antinuclear antibody) 10/12/2021   Impingement syndrome of right shoulder 07/07/2021   Labral tear of shoulder, degenerative, right 07/07/2021   Carpal tunnel syndrome, bilateral 07/07/2021   DDD (degenerative disc disease), cervical 07/07/2021   Other fatigue 05/08/2021   Leg cramps 05/08/2021    Frequency of urination 05/08/2021   Irregular menses 05/08/2021   Scoliosis    Palpitations 01/26/2021   Left breast mass 01/26/2021   Polyuria 01/06/2021   Hypercalcemia 01/06/2021   Malaise 01/06/2021   Need for tetanus, diphtheria, and acellular pertussis (Tdap) vaccine 01/06/2021   Weight loss 01/02/2021   Hyperglycemia 01/02/2021    PCP: Nicholaus Credit, PA-C   REFERRING PROVIDER:  Jeannetta Lonni ORN, MD     REFERRING DIAG:  M77.9 (ICD-10-CM) - Tendinitis  M70.71 (ICD-10-CM) - Bursitis of right hip, unspecified bursa  M25.551 (ICD-10-CM) - Pain in right hip    THERAPY DIAG:  No diagnosis found.  Rationale for Evaluation and Treatment: Rehabilitation  ONSET DATE: ***  SUBJECTIVE:   SUBJECTIVE STATEMENT: Patient reports to PT d/t R hip pain that has been present for ***. She does not recall any hip related injuries. She notes that she has started to turn her toes in with standing. She has some popping in her knees.     PERTINENT HISTORY: Relevant PMHx includes fibromyalgia, scoliosis, peripheral neuropathy, BIL knee pain, vitamin D  deficiency  PAIN:  Are you having pain? Yes: NPRS scale: *** Pain location: R hip and thigh  Pain description: sore Aggravating factors: prolonged standing at work Relieving factors: ***  PRECAUTIONS: {Therapy precautions:24002}  RED FLAGS: {PT Red Flags:29287}   WEIGHT BEARING RESTRICTIONS: {Yes ***/No:24003}  FALLS:  Has patient fallen in last 6 months? {fallsyesno:27318}  LIVING ENVIRONMENT: Lives with: {[facility] lives with:25569::lives with their family} Lives in: {Lives in:25570} Stairs: {opstairs:27293}  Has following equipment at home: {Assistive devices:23999}  OCCUPATION: ***  PLOF: {PLOF:24004}  PATIENT GOALS: ***  NEXT MD VISIT: ***  OBJECTIVE:  Note: Objective measures were completed at Evaluation unless otherwise noted.  DIAGNOSTIC FINDINGS:   Per Dr. Jeannetta, Hip xray looked good with no significant  osteoarthritis or structural damage.  PATIENT SURVEYS:  LEFS  Extreme difficulty/unable (0), Quite a bit of difficulty (1), Moderate difficulty (2), Little difficulty (3), No difficulty (4) Survey date:    Any of your usual work, housework or school activities   2. Usual hobbies, recreational or sporting activities   3. Getting into/out of the bath   4. Walking between rooms   5. Putting on socks/shoes   6. Squatting    7. Lifting an object, like a bag of groceries from the floor   8. Performing light activities around your home   9. Performing heavy activities around your home   10. Getting into/out of a car   11. Walking 2 blocks   12. Walking 1 mile   13. Going up/down 10 stairs (1 flight)   14. Standing for 1 hour   15.  sitting for 1 hour   16. Running on even ground   17. Running on uneven ground   18. Making sharp turns while running fast   19. Hopping    20. Rolling over in bed   Score total:  ***     COGNITION: Overall cognitive status: {cognition:24006}     SENSATION: {sensation:27233}  EDEMA:  {edema:24020}  MUSCLE LENGTH: Hamstrings: Right *** deg; Left *** deg Debby test: Right *** deg; Left *** deg  POSTURE: {posture:25561}  PALPATION: ***  LOWER EXTREMITY ROM:  Active ROM Right eval Left eval  Hip flexion    Hip extension    Hip abduction    Hip adduction    Hip internal rotation    Hip external rotation    Knee flexion    Knee extension    Ankle dorsiflexion    Ankle plantarflexion    Ankle inversion    Ankle eversion     (Blank rows = not tested)  LOWER EXTREMITY MMT:  MMT Right eval Left eval  Hip flexion    Hip extension    Hip abduction    Hip adduction    Hip internal rotation    Hip external rotation    Knee flexion    Knee extension    Ankle dorsiflexion    Ankle plantarflexion    Ankle inversion    Ankle eversion     (Blank rows = not tested)  LOWER EXTREMITY SPECIAL TESTS:  Hip special tests: {HIP SPECIAL  UZDUD:73760}  FUNCTIONAL TESTS:  {Functional tests:24029}  GAIT: Distance walked: *** Assistive device utilized: {Assistive devices:23999} Level of assistance: {Levels of assistance:24026} Comments: ***  TREATMENT DATE:   [facility] Adult PT Treatment:                                                DATE: 09/21/2024   Initial evaluation: see patient education and home exercise program as noted below      PATIENT EDUCATION:  Education details: reviewed initial home exercise program; discussion of POC, prognosis and goals for skilled PT   Person educated: {Person educated:25204} Education method: {Education Method:25205} Education comprehension: {Education Comprehension:25206}  HOME EXERCISE PROGRAM: ***  ASSESSMENT:  CLINICAL IMPRESSION: Shamikia is a 48 y.o. female who was seen today for physical therapy evaluation and treatment for ***. She is demonstrating ***. She has related pain and difficulty with ***. She requires skilled PT services at this time to address relevant deficits and improve overall function.     OBJECTIVE IMPAIRMENTS: {opptimpairments:25111}.   ACTIVITY LIMITATIONS: {activitylimitations:27494}  PARTICIPATION LIMITATIONS: {participationrestrictions:25113}  PERSONAL FACTORS: {Personal factors:25162} are also affecting patient's functional outcome.   REHAB POTENTIAL: {rehabpotential:25112}  CLINICAL DECISION MAKING: {clinical decision making:25114}  EVALUATION COMPLEXITY: {Evaluation complexity:25115}   GOALS: Goals reviewed with patient? YES  SHORT TERM GOALS: Target date: ***  Patient will be independent with initial home program at least 3 days/week.  Baseline: provided at eval Goal Status: INITIAL   2.  *** Baseline:  Goal Status: INITIAL   3.  *** Baseline:  Goal status: INITIAL   LONG TERM GOALS: Target  date: ***  Patient will report improved overall functional ability with *** score of ***.  Baseline:  Goal Status: INITIAL    2.  *** Baseline:  Goal status: INITIAL  3.  *** Baseline:  Goal status: INITIAL  4.  *** Baseline:  Goal status: INITIAL     PLAN:  PT FREQUENCY: {rehab frequency:25116}  PT DURATION: {rehab duration:25117}  PLANNED INTERVENTIONS: {rehab planned interventions:25118::97110-Therapeutic exercises,97530- Therapeutic [phone]- Neuromuscular re-education,97535- Self Rjmz,02859- Manual therapy,Patient/Family education}  PLAN FOR NEXT SESSION: PIERRETTE Marko Molt, PT 09/21/2024, 6:22 PM

## 2024-09-22 ENCOUNTER — Other Ambulatory Visit: Payer: Self-pay

## 2024-09-22 ENCOUNTER — Ambulatory Visit: Attending: Internal Medicine

## 2024-09-22 DIAGNOSIS — M779 Enthesopathy, unspecified: Secondary | ICD-10-CM | POA: Insufficient documentation

## 2024-09-22 DIAGNOSIS — M7071 Other bursitis of hip, right hip: Secondary | ICD-10-CM | POA: Diagnosis not present

## 2024-09-22 DIAGNOSIS — M25551 Pain in right hip: Secondary | ICD-10-CM | POA: Insufficient documentation

## 2024-09-24 DIAGNOSIS — R42 Dizziness and giddiness: Secondary | ICD-10-CM | POA: Diagnosis not present

## 2024-09-24 NOTE — Therapy (Incomplete)
 OUTPATIENT PHYSICAL THERAPY EVALUATION   Patient Name: Sarah [name] MRN: [id number] DOB:10/09/1976, 48 y.o., female Today's Date: 09/24/2024  END OF SESSION:    Past Medical History:  Diagnosis Date   Carpal tunnel syndrome    Fibromyalgia    GERD (gastroesophageal reflux disease)    Hernia, hiatal    Hypercalcemia 01/06/2021   Left breast mass 01/26/2021   Liver cyst    Malaise 01/06/2021   Need for tetanus, diphtheria, and acellular pertussis (Tdap) vaccine 01/06/2021   Palpitations 01/26/2021   Polyuria 01/06/2021   Scoliosis    Weight loss 01/02/2021   Past Surgical History:  Procedure Laterality Date   CARPAL TUNNEL RELEASE Right 07/01/2023   Procedure: right carpal tunnel release;  Surgeon: Murrell Drivers, MD;  Location: [facility];  Service: Orthopedics;  Laterality: Right;   CARPAL TUNNEL RELEASE Left 11/11/2023   Procedure: LEFT CARPAL TUNNEL RELEASE;  Surgeon: Murrell Drivers, MD;  Location: [facility];  Service: Orthopedics;  Laterality: Left;  30 MIN   CERVICAL SPINE SURGERY     Left fooscrew and plate k7u      TUBAL LIGATION     Patient Active Problem List   Diagnosis Date Noted   Peripheral neuropathy 03/12/2024   Bilateral knee pain 08/23/2023   Flank pain 03/18/2023   Family history of elevated blood lipids 03/18/2023   Screening for colon cancer 09/10/2022   Gastroesophageal reflux disease without esophagitis 09/10/2022   Needs flu shot 09/10/2022   Vitamin D  deficiency 08/13/2022   Pain in right hip 08/13/2022   Facial rash 02/08/2022   Fibromyalgia 11/09/2021   Fibromyalgia syndrome 11/09/2021   Positive ANA (antinuclear antibody) 10/12/2021   Impingement syndrome of right shoulder 07/07/2021   Labral tear of shoulder, degenerative, right 07/07/2021   Carpal tunnel syndrome, bilateral 07/07/2021   DDD (degenerative disc disease), cervical 07/07/2021   Other fatigue 05/08/2021   Leg cramps 05/08/2021   Frequency of  urination 05/08/2021   Irregular menses 05/08/2021   Scoliosis    Palpitations 01/26/2021   Left breast mass 01/26/2021   Polyuria 01/06/2021   Hypercalcemia 01/06/2021   Malaise 01/06/2021   Need for tetanus, diphtheria, and acellular pertussis (Tdap) vaccine 01/06/2021   Weight loss 01/02/2021   Hyperglycemia 01/02/2021    PCP: Nicholaus Credit, PA-C  REFERRING PROVIDER:  Jeannetta Lonni ORN, MD     REFERRING DIAG:  M77.9 (ICD-10-CM) - Tendinitis  M70.71 (ICD-10-CM) - Bursitis of right hip, unspecified bursa  M25.551 (ICD-10-CM) - Pain in right hip    THERAPY DIAG:  No diagnosis found.  Rationale for Evaluation and Treatment: Rehabilitation  ONSET DATE: >6 months   SUBJECTIVE:   SUBJECTIVE STATEMENT: ***  EVAL: Patient reports to PT d/t R hip pain that has been present for >6 months. She does not recall any hip related injuries. She notes that she has started to notice her R foot rolling in (pronation) with any standing. She states that this has already been happening on her L foot for years. She has history of knee pain. Her understanding is that she has some rotation and bone spurs on R side.    PERTINENT HISTORY: Relevant PMHx includes fibromyalgia, scoliosis, peripheral neuropathy, BIL knee pain, vitamin D  deficiency  PAIN:  Are you having pain? Yes: NPRS scale: 3-4/10 current, 5/10 worst  Pain location: R hip and thigh  Pain description: sore Aggravating factors: prolonged standing, driving, sitting with legs crossed on ground Relieving factors: changing positions  PRECAUTIONS: None  RED FLAGS: None   WEIGHT BEARING RESTRICTIONS: No  FALLS:  Has patient fallen in last 6 months? Yes. Number of falls >3  LIVING ENVIRONMENT: Lives with: lives with their family Lives in: House/apartment Stairs: No Has following equipment at home: None  OCCUPATION: unemployed  PLOF: Independent  PATIENT GOALS: to have less pain in R hip with daily activities including  standing, walking, prolonged sitting   NEXT MD VISIT: 10/01/2024  OBJECTIVE:  Note: Objective measures were completed at Evaluation unless otherwise noted.  DIAGNOSTIC FINDINGS:   Per Dr. Jeannetta, Hip xray looked good with no significant osteoarthritis or structural damage.  PATIENT SURVEYS:  LEFS  Extreme difficulty/unable (0), Quite a bit of difficulty (1), Moderate difficulty (2), Little difficulty (3), No difficulty (4) Survey date:  09/22/2024   Any of your usual work, housework or school activities 2  2. Usual hobbies, recreational or sporting activities 1  3. Getting into/out of the bath 3  4. Walking between rooms 4  5. Putting on socks/shoes 4  6. Squatting  2  7. Lifting an object, like a bag of groceries from the floor 4  8. Performing light activities around your home 4  9. Performing heavy activities around your home 4  10. Getting into/out of a car 2  11. Walking 2 blocks 2  12. Walking 1 mile 2  13. Going up/down 10 stairs (1 flight) 2  14. Standing for 1 hour 1  15.  sitting for 1 hour 1  16. Running on even ground 2  17. Running on uneven ground 1  18. Making sharp turns while running fast 0  19. Hopping  0  20. Rolling over in bed 3  Score total:  44/80     THE PATIENT SPECIFIC FUNCTIONAL SCALE  Place score of 0-10 (0 = unable to perform activity and 10 = able to perform activity at the same level as before injury or problem)  Activity Date: 09/22/24    Long Distance Driving (~2 hours) 5    2.     3.     4.      Total Score 5      Total Score = Sum of activity scores/number of activities  Minimally Detectable Change: 3 points (for single activity); 2 points (for average score)  [facility] (nd). The Patient Specific Functional Scale . Retrieved from [email or web address]   COGNITION: Overall cognitive status: Within functional limits for tasks assessed     SENSATION: Not  tested  LOWER EXTREMITY MMT:  MMT Right eval Left eval  Hip flexion 4- 4  Hip extension 3 3+  Hip abduction 3 4-  Hip adduction    Hip internal rotation 4- 4  Hip external rotation 3+ 4-  Knee flexion 4 4  Knee extension 3+ 4-  Ankle dorsiflexion 4 4  Ankle plantarflexion    Ankle inversion    Ankle eversion     (Blank rows = not tested)   LOWER EXTREMITY SPECIAL TESTS:  Hip special tests: deferred  FUNCTIONAL TESTS:  Deferred    Balance  09/22/24  Romberg EO on floor  30s  Romberg EC on floor 30s, mod sway  Romberg EO on airex   Romberg EC on airex     Balance Right 09/22/24 Left 09/22/24   Tandem on floor 20s 20s  SLS on floor  TREATMENT DATE:  [facility] Adult PT Treatment:                                                DATE: 09/28/24 Therapeutic Exercise: *** Manual Therapy: *** Neuromuscular re-ed: *** Therapeutic Activity: *** Modalities: *** Self Care: PIERRETTE PLANTS Adult PT Treatment:                                                DATE: 09/22/24   Initial evaluation: see patient education and home exercise program as noted below      PATIENT EDUCATION:  Education details: reviewed initial home exercise program; discussion of POC, prognosis and goals for skilled PT   Person educated: Patient Education method: Explanation, Demonstration, and Handouts Education comprehension: verbalized understanding, returned demonstration, and needs further education  HOME EXERCISE PROGRAM: Access Code: [email or web address] URL: [email or web address] Date: 09/22/2024 Prepared by: Marko Molt  Exercises - Clamshell  - 1 x daily - 4 x weekly - 2 sets - 10 reps - Supine Active Straight Leg Raise  - 1 x daily - 4 x weekly - 2 sets - 10 reps - Supine Bridge  - 1 x daily - 4 x weekly - 2 sets - 10 reps - Supine Hip Adduction  Isometric with Ball  - 1 x daily - 4 x weekly - 2 sets - 10 reps - 3 sec hold - Seated Long Arc Quad  - 1 x daily - 4 x weekly - 2 sets - 10 reps - 3 sec hold - Heel Toe Raises with Counter Support  - 1 x daily - 4 x weekly - 2 sets - 10 reps  ASSESSMENT:  CLINICAL IMPRESSION: ***  EVAL: Sarah [name] is a 48 y.o. female who was seen today for physical therapy evaluation and treatment for persisted R hip pain with strength deficits. She has decreased MMT scores BIL. She is demonstrating diminished static standing balance with narrow BOS. Dynamic balance to be further assessed and f/u visit. She has related pain and difficulty with standing, walking, driving, and sitting with legs crossed. She requires skilled PT services at this time to address relevant deficits and improve overall function.     OBJECTIVE IMPAIRMENTS: decreased activity tolerance, decreased balance, decreased endurance, decreased strength, and pain.   ACTIVITY LIMITATIONS: carrying, sitting, standing, stairs, and locomotion level  PARTICIPATION LIMITATIONS: cleaning, interpersonal relationship, driving, shopping, community activity, and occupation  PERSONAL FACTORS: Time since onset of injury/illness/exacerbation and 3+ comorbidities: Relevant PMHx includes fibromyalgia, scoliosis, peripheral neuropathy, BIL knee pain, vitamin D  deficiency are also affecting patient's functional outcome.   REHAB POTENTIAL: Fair    CLINICAL DECISION MAKING: Evolving/moderate complexity  EVALUATION COMPLEXITY: Moderate   GOALS: Goals reviewed with patient? YES  SHORT TERM GOALS: Target date: 10/20/2024   Patient will be independent with initial home program at least 3 days/week.  Baseline: provided at eval Goal Status: INITIAL   2.  Patient will demonstrate improved BIL LE MMT scores to at least 4+/5.  Baseline:  Goal Status: INITIAL     LONG TERM GOALS: Target date: 11/17/2024   Patient will report improved overall functional  ability with LEFS score of 60/80 or greater.  Baseline: 44/80 Goal Status: INITIAL  2.  Patient will report 7/10 or greater PSFS score for long distance driving.  Baseline: 5/10 Goal status: INITIAL  3.  Patient will have 20/24 on DGI assessment in order to demonstrate decreased fall risk.  Baseline: to be assessed at f/u visit Goal status: INITIAL   PLAN:  PT FREQUENCY: 1-2x/week  PT DURATION: 8 weeks  PLANNED INTERVENTIONS: 97164- PT Re-evaluation, 97750- Physical Performance Testing, 97110-Therapeutic exercises, 97530- Therapeutic activity, 97112- Neuromuscular re-education, 97535- Self Care, 02859- Manual therapy, [phone]- Aquatic Therapy, [id number]- Electrical stimulation (unattended), [phone]- Electrical stimulation (manual), 20560 (1-2 muscles), 20561 (3+ muscles)- Dry Needling, Patient/Family education, Balance training, Taping, Joint mobilization, Cryotherapy, and Moist heat  PLAN FOR NEXT SESSION: DGI assessment; address LQ strength, pain modulation with manual therapy, modalities and low-impact aerobic activity    Sarah [name] PTA  09/24/2024 9:07 AM

## 2024-09-28 ENCOUNTER — Ambulatory Visit: Attending: Internal Medicine

## 2024-09-28 ENCOUNTER — Telehealth: Payer: Self-pay | Admitting: Cardiology

## 2024-09-28 DIAGNOSIS — M25551 Pain in right hip: Secondary | ICD-10-CM | POA: Insufficient documentation

## 2024-09-28 NOTE — Telephone Encounter (Signed)
 Patient is following up regarding order for pulmonary function test. She would like to know how soon she needs to have it done and if there is a specific office she needs to contact. Please advise.

## 2024-09-28 NOTE — Therapy (Signed)
 OUTPATIENT PHYSICAL THERAPY TREATMENT   Patient Name: Sarah [name] MRN: [id number] DOB:04-25-1976, 48 y.o., female Today's Date: 09/28/2024  END OF SESSION:  PT End of Session - 09/28/24 1043     Visit Number 2    Number of Visits 16    Date for Recertification  11/17/24    PT Start Time 1045    PT Stop Time 1130    PT Time Calculation (min) 45 min    Activity Tolerance Patient tolerated treatment well    Behavior During Therapy [facility] for tasks assessed/performed           Past Medical History:  Diagnosis Date   Carpal tunnel syndrome    Fibromyalgia    GERD (gastroesophageal reflux disease)    Hernia, hiatal    Hypercalcemia 01/06/2021   Left breast mass 01/26/2021   Liver cyst    Malaise 01/06/2021   Need for tetanus, diphtheria, and acellular pertussis (Tdap) vaccine 01/06/2021   Palpitations 01/26/2021   Polyuria 01/06/2021   Scoliosis    Weight loss 01/02/2021   Past Surgical History:  Procedure Laterality Date   CARPAL TUNNEL RELEASE Right 07/01/2023   Procedure: right carpal tunnel release;  Surgeon: Murrell Drivers, MD;  Location: [facility];  Service: Orthopedics;  Laterality: Right;   CARPAL TUNNEL RELEASE Left 11/11/2023   Procedure: LEFT CARPAL TUNNEL RELEASE;  Surgeon: Murrell Drivers, MD;  Location: [facility];  Service: Orthopedics;  Laterality: Left;  30 MIN   CERVICAL SPINE SURGERY     Left fooscrew and plate [id number]      TUBAL LIGATION     Patient Active Problem List   Diagnosis Date Noted   Peripheral neuropathy 03/12/2024   Bilateral knee pain 08/23/2023   Flank pain 03/18/2023   Family history of elevated blood lipids 03/18/2023   Screening for colon cancer 09/10/2022   Gastroesophageal reflux disease without esophagitis 09/10/2022   Needs flu shot 09/10/2022   Vitamin D  deficiency 08/13/2022   Pain in right hip 08/13/2022   Facial rash 02/08/2022   Fibromyalgia 11/09/2021   Fibromyalgia syndrome 11/09/2021    Positive ANA (antinuclear antibody) 10/12/2021   Impingement syndrome of right shoulder 07/07/2021   Labral tear of shoulder, degenerative, right 07/07/2021   Carpal tunnel syndrome, bilateral 07/07/2021   DDD (degenerative disc disease), cervical 07/07/2021   Other fatigue 05/08/2021   Leg cramps 05/08/2021   Frequency of urination 05/08/2021   Irregular menses 05/08/2021   Scoliosis    Palpitations 01/26/2021   Left breast mass 01/26/2021   Polyuria 01/06/2021   Hypercalcemia 01/06/2021   Malaise 01/06/2021   Need for tetanus, diphtheria, and acellular pertussis (Tdap) vaccine 01/06/2021   Weight loss 01/02/2021   Hyperglycemia 01/02/2021    PCP: Nicholaus Credit, PA-C  REFERRING PROVIDER:  Jeannetta Lonni ORN, MD     REFERRING DIAG:  M77.9 (ICD-10-CM) - Tendinitis  M70.71 (ICD-10-CM) - Bursitis of right hip, unspecified bursa  M25.551 (ICD-10-CM) - Pain in right hip    THERAPY DIAG:  Pain in right hip  Rationale for Evaluation and Treatment: Rehabilitation  ONSET DATE: >6 months   SUBJECTIVE:   SUBJECTIVE STATEMENT: Patient stated that she fell earlier this past week but did not hit her head. She also reports soreness in the right hip.   Eval: Patient reports to PT d/t R hip pain that has been present for >6 months. She does not recall any hip related injuries. She notes that she has started to  notice her R foot rolling in (pronation) with any standing. She states that this has already been happening on her L foot for years. She has history of knee pain. Her understanding is that she has some rotation and bone spurs on R side.     PERTINENT HISTORY: Relevant PMHx includes fibromyalgia, scoliosis, peripheral neuropathy, BIL knee pain, vitamin D  deficiency  PAIN:  Are you having pain? Yes: NPRS scale: 3-4/10 current, 5/10 worst  Pain location: R hip and thigh  Pain description: sore Aggravating factors: prolonged standing, driving, sitting with legs crossed on  ground Relieving factors: changing positions  PRECAUTIONS: None  RED FLAGS: None   WEIGHT BEARING RESTRICTIONS: No  FALLS:  Has patient fallen in last 6 months? Yes. Number of falls >3  LIVING ENVIRONMENT: Lives with: lives with their family Lives in: House/apartment Stairs: No Has following equipment at home: None  OCCUPATION: unemployed  PLOF: Independent  PATIENT GOALS: to have less pain in R hip with daily activities including standing, walking, prolonged sitting   NEXT MD VISIT: 10/01/2024  OBJECTIVE:  Note: Objective measures were completed at Evaluation unless otherwise noted.  DIAGNOSTIC FINDINGS:   Per Dr. Jeannetta, Hip xray looked good with no significant osteoarthritis or structural damage.  PATIENT SURVEYS:  LEFS  Extreme difficulty/unable (0), Quite a bit of difficulty (1), Moderate difficulty (2), Little difficulty (3), No difficulty (4) Survey date:  09/22/2024   Any of your usual work, housework or school activities 2  2. Usual hobbies, recreational or sporting activities 1  3. Getting into/out of the bath 3  4. Walking between rooms 4  5. Putting on socks/shoes 4  6. Squatting  2  7. Lifting an object, like a bag of groceries from the floor 4  8. Performing light activities around your home 4  9. Performing heavy activities around your home 4  10. Getting into/out of a car 2  11. Walking 2 blocks 2  12. Walking 1 mile 2  13. Going up/down 10 stairs (1 flight) 2  14. Standing for 1 hour 1  15.  sitting for 1 hour 1  16. Running on even ground 2  17. Running on uneven ground 1  18. Making sharp turns while running fast 0  19. Hopping  0  20. Rolling over in bed 3  Score total:  44/80     THE PATIENT SPECIFIC FUNCTIONAL SCALE  Place score of 0-10 (0 = unable to perform activity and 10 = able to perform activity at the same level as before injury or problem)  Activity Date: 09/22/24    Long Distance Driving (~2 hours) 5    2.     3.      4.      Total Score 5      Total Score = Sum of activity scores/number of activities  Minimally Detectable Change: 3 points (for single activity); 2 points (for average score)  [facility] (nd). The Patient Specific Functional Scale . Retrieved from [email or web address]   COGNITION: Overall cognitive status: Within functional limits for tasks assessed     SENSATION: Not tested  LOWER EXTREMITY MMT:  MMT Right eval Left eval  Hip flexion 4- 4  Hip extension 3 3+  Hip abduction 3 4-  Hip adduction    Hip internal rotation 4- 4  Hip external rotation 3+ 4-  Knee flexion 4 4  Knee extension 3+ 4-  Ankle dorsiflexion 4 4  Ankle plantarflexion  Ankle inversion    Ankle eversion     (Blank rows = not tested)   LOWER EXTREMITY SPECIAL TESTS:  Hip special tests: deferred  FUNCTIONAL TESTS:  Deferred    Balance  09/22/24  Romberg EO on floor  30s  Romberg EC on floor 30s, mod sway  Romberg EO on airex   Romberg EC on airex     Balance Right 09/22/24 Left 09/22/24   Tandem on floor 20s 20s  SLS on floor      DGI Mild impairment: walks 20', uses assistive devices, slower speed, mild gait deviations. (2) Normal: Able to smoothly change walking speed without loss of balance or gait deviation. Shows significant difference in walking speeds between normal, fast and slow paces. (3) Moderate impairment: Performs head turns with moderate change in gait velocity, slows down, staggers, but recovers, can continue to walk. (1) Moderate impairment: Performs head turns with moderate change in gait velocity, slows down, staggers, but recovers, can continue to walk. (1) Mild impairment: pivot turns safely in >3 seconds and stops with no loss of balance. (2) Mild impairment: Is able to step over shoe box, but must slow down and adjust steps to clear box safely. (2) Normal: Is able to walk safely around cones  safely without changing gait speed; no evidence of imbalance. (3) Moderate impairment: Two feet to a stair, must use rail. (1) Total Score: 15/24 Scores below 19 suggest that the subject assessed has a higher risk of prospective falls                                                                                                                                 TREATMENT DATE:  [facility] Adult PT Treatment:                                                DATE: 09/28/24 Therapeutic Exercise: Bike level 3 x 5 mins Standing hip abduction/extension 2x10 Supine bridges 2x10 Straight leg raise 2x10 RLE Seated FAQ with hip adduction ball squeeze Neuromuscular re-ed: Tandem stance BIL 2x30 SLS BIL x30 (UE support PRN) Therapeutic Activity: DGI Step-ups anterior/lateral 4 BIL 2x10  [facility] Adult PT Treatment:                                                DATE: 09/22/24   Initial evaluation: see patient education and home exercise program as noted below      PATIENT EDUCATION:  Education details: reviewed initial home exercise program; discussion of POC, prognosis and goals for skilled PT   Person educated: Patient Education method: Explanation, Demonstration, and Handouts Education comprehension: verbalized understanding, returned demonstration, and needs further  education  HOME EXERCISE PROGRAM: Access Code: [email or web address] URL: [email or web address] Date: 09/22/2024 Prepared by: Marko Molt  Exercises - Clamshell  - 1 x daily - 4 x weekly - 2 sets - 10 reps - Supine Active Straight Leg Raise  - 1 x daily - 4 x weekly - 2 sets - 10 reps - Supine Bridge  - 1 x daily - 4 x weekly - 2 sets - 10 reps - Supine Hip Adduction Isometric with Ball  - 1 x daily - 4 x weekly - 2 sets - 10 reps - 3 sec hold - Seated Long Arc Quad  - 1 x daily - 4 x weekly - 2 sets - 10 reps - 3 sec hold - Heel Toe Raises with Counter Support  - 1 x daily - 4 x weekly - 2 sets - 10  reps  ASSESSMENT:  CLINICAL IMPRESSION: Patient presents to PT reporting that she has soreness in her entire RLE and that she fell last week when her dog pulled on the leash, she did not hit her head and she did not have any increased pain in the right hip from this. Administered DGI this session with patient scoring 15-24, showing increased risk for falls. Patient demonstrated slight postural swaying with balance exercises but no significant LOB. Patient was able to tolerate all exercises, she reported a tightness in her right hip. Patient would benefit from skilled PT to improve strength and improve function.   Eval: Lorraina is a 48 y.o. female who was seen today for physical therapy evaluation and treatment for persisted R hip pain with strength deficits. She has decreased MMT scores BIL. She is demonstrating diminished static standing balance with narrow BOS. Dynamic balance to be further assessed and f/u visit. She has related pain and difficulty with standing, walking, driving, and sitting with legs crossed. She requires skilled PT services at this time to address relevant deficits and improve overall function.     OBJECTIVE IMPAIRMENTS: decreased activity tolerance, decreased balance, decreased endurance, decreased strength, and pain.   ACTIVITY LIMITATIONS: carrying, sitting, standing, stairs, and locomotion level  PARTICIPATION LIMITATIONS: cleaning, interpersonal relationship, driving, shopping, community activity, and occupation  PERSONAL FACTORS: Time since onset of injury/illness/exacerbation and 3+ comorbidities: Relevant PMHx includes fibromyalgia, scoliosis, peripheral neuropathy, BIL knee pain, vitamin D  deficiency are also affecting patient's functional outcome.   REHAB POTENTIAL: Fair    CLINICAL DECISION MAKING: Evolving/moderate complexity  EVALUATION COMPLEXITY: Moderate   GOALS: Goals reviewed with patient? YES  SHORT TERM GOALS: Target date: 10/20/2024   Patient  will be independent with initial home program at least 3 days/week.  Baseline: provided at eval Goal Status: INITIAL   2.  Patient will demonstrate improved BIL LE MMT scores to at least 4+/5.  Baseline:  Goal Status: INITIAL     LONG TERM GOALS: Target date: 11/17/2024   Patient will report improved overall functional ability with LEFS score of 60/80 or greater.  Baseline: 44/80 Goal Status: INITIAL    2.  Patient will report 7/10 or greater PSFS score for long distance driving.  Baseline: 5/10 Goal status: INITIAL  3.  Patient will have 20/24 on DGI assessment in order to demonstrate decreased fall risk.  Baseline: to be assessed at f/u visit Goal status: INITIAL      PLAN:  PT FREQUENCY: 1-2x/week  PT DURATION: 8 weeks  PLANNED INTERVENTIONS: 97164- PT Re-evaluation, 97750- Physical Performance Testing, 97110-Therapeutic exercises, 97530- Therapeutic activity, [id number]- Neuromuscular re-education, 97535- Self Care, [id number]-  Manual therapy, [id number]- Aquatic Therapy, [id number]- Electrical stimulation (unattended), [id number]- Electrical stimulation (manual), 79439 (1-2 muscles), 20561 (3+ muscles)- Dry Needling, Patient/Family education, Balance training, Taping, Joint mobilization, Cryotherapy, and Moist heat  PLAN FOR NEXT SESSION: DGI assessment; address LQ strength, pain modulation with manual therapy, modalities and low-impact aerobic activity    Shanda Code, SPTA  09/28/2024 11:40 AM

## 2024-09-28 NOTE — Telephone Encounter (Signed)
 Left message for patient that a message has been left for the respiratory department at [facility] cone to call her to schedule.

## 2024-09-30 ENCOUNTER — Ambulatory Visit (HOSPITAL_COMMUNITY)
Admission: RE | Admit: 2024-09-30 | Discharge: 2024-09-30 | Disposition: A | Discharge status: Home / Self Care | Source: Ambulatory Visit | Attending: Cardiology | Admitting: Cardiology

## 2024-09-30 ENCOUNTER — Ambulatory Visit

## 2024-09-30 DIAGNOSIS — R0602 Shortness of breath: Secondary | ICD-10-CM | POA: Diagnosis present

## 2024-09-30 DIAGNOSIS — M25551 Pain in right hip: Secondary | ICD-10-CM

## 2024-09-30 LAB — PULMONARY FUNCTION TEST
DL/VA % pred: 67 %
DL/VA: 2.89 ml/min/mmHg/L
DLCO unc % pred: 62 %
DLCO unc: 14.1 ml/min/mmHg
FEF 25-75 Post: 2.91 L/s
FEF 25-75 Pre: 1.95 L/s
FEF2575-%Change-Post: 49 %
FEF2575-%Pred-Post: 98 %
FEF2575-%Pred-Pre: 65 %
FEV1-%Change-Post: 11 %
FEV1-%Pred-Post: 90 %
FEV1-%Pred-Pre: 81 %
FEV1-Post: 2.74 L
FEV1-Pre: 2.46 L
FEV1FVC-%Change-Post: 7 %
FEV1FVC-%Pred-Pre: 91 %
FEV6-%Change-Post: 3 %
FEV6-%Pred-Post: 92 %
FEV6-%Pred-Pre: 89 %
FEV6-Post: 3.42 L
FEV6-Pre: 3.3 L
FEV6FVC-%Change-Post: 0 %
FEV6FVC-%Pred-Post: 102 %
FEV6FVC-%Pred-Pre: 102 %
FVC-%Change-Post: 3 %
FVC-%Pred-Post: 90 %
FVC-%Pred-Pre: 87 %
FVC-Post: 3.42 L
FVC-Pre: 3.31 L
Post FEV1/FVC ratio: 80 %
Post FEV6/FVC ratio: 100 %
Pre FEV1/FVC ratio: 74 %
Pre FEV6/FVC Ratio: 100 %
RV % pred: 126 %
RV: 2.28 L
TLC % pred: 100 %
TLC: 5.29 L

## 2024-09-30 MED ORDER — ALBUTEROL SULFATE (2.5 MG/3ML) 0.083% IN NEBU
2.5000 mg | INHALATION_SOLUTION | Freq: Once | RESPIRATORY_TRACT | Status: AC
  Administered 2024-09-30: 2.5 mg via RESPIRATORY_TRACT

## 2024-09-30 NOTE — Therapy (Signed)
 OUTPATIENT PHYSICAL THERAPY TREATMENT   Patient Name: Sarah [name] MRN: [id number] DOB:February 09, 1976, 48 y.o., female Today's Date: 09/30/2024  END OF SESSION:     Past Medical History:  Diagnosis Date   Carpal tunnel syndrome    Fibromyalgia    GERD (gastroesophageal reflux disease)    Hernia, hiatal    Hypercalcemia 01/06/2021   Left breast mass 01/26/2021   Liver cyst    Malaise 01/06/2021   Need for tetanus, diphtheria, and acellular pertussis (Tdap) vaccine 01/06/2021   Palpitations 01/26/2021   Polyuria 01/06/2021   Scoliosis    Weight loss 01/02/2021   Past Surgical History:  Procedure Laterality Date   CARPAL TUNNEL RELEASE Right 07/01/2023   Procedure: right carpal tunnel release;  Surgeon: Murrell Drivers, MD;  Location: [facility];  Service: Orthopedics;  Laterality: Right;   CARPAL TUNNEL RELEASE Left 11/11/2023   Procedure: LEFT CARPAL TUNNEL RELEASE;  Surgeon: Murrell Drivers, MD;  Location: [facility];  Service: Orthopedics;  Laterality: Left;  30 MIN   CERVICAL SPINE SURGERY     Left fooscrew and plate k7u      TUBAL LIGATION     Patient Active Problem List   Diagnosis Date Noted   Peripheral neuropathy 03/12/2024   Bilateral knee pain 08/23/2023   Flank pain 03/18/2023   Family history of elevated blood lipids 03/18/2023   Screening for colon cancer 09/10/2022   Gastroesophageal reflux disease without esophagitis 09/10/2022   Needs flu shot 09/10/2022   Vitamin D  deficiency 08/13/2022   Pain in right hip 08/13/2022   Facial rash 02/08/2022   Fibromyalgia 11/09/2021   Fibromyalgia syndrome 11/09/2021   Positive ANA (antinuclear antibody) 10/12/2021   Impingement syndrome of right shoulder 07/07/2021   Labral tear of shoulder, degenerative, right 07/07/2021   Carpal tunnel syndrome, bilateral 07/07/2021   DDD (degenerative disc disease), cervical 07/07/2021   Other fatigue 05/08/2021   Leg cramps 05/08/2021   Frequency of  urination 05/08/2021   Irregular menses 05/08/2021   Scoliosis    Palpitations 01/26/2021   Left breast mass 01/26/2021   Polyuria 01/06/2021   Hypercalcemia 01/06/2021   Malaise 01/06/2021   Need for tetanus, diphtheria, and acellular pertussis (Tdap) vaccine 01/06/2021   Weight loss 01/02/2021   Hyperglycemia 01/02/2021    PCP: Nicholaus Credit, PA-C  REFERRING PROVIDER:  Jeannetta Lonni ORN, MD     REFERRING DIAG:  M77.9 (ICD-10-CM) - Tendinitis  M70.71 (ICD-10-CM) - Bursitis of right hip, unspecified bursa  M25.551 (ICD-10-CM) - Pain in right hip    THERAPY DIAG:  No diagnosis found.  Rationale for Evaluation and Treatment: Rehabilitation  ONSET DATE: >6 months   SUBJECTIVE:   SUBJECTIVE STATEMENT: Patient stated that she fell earlier this past week but did not hit her head. She also reports soreness in the right hip.   Eval: Patient reports to PT d/t R hip pain that has been present for >6 months. She does not recall any hip related injuries. She notes that she has started to notice her R foot rolling in (pronation) with any standing. She states that this has already been happening on her L foot for years. She has history of knee pain. Her understanding is that she has some rotation and bone spurs on R side.     PERTINENT HISTORY: Relevant PMHx includes fibromyalgia, scoliosis, peripheral neuropathy, BIL knee pain, vitamin D  deficiency  PAIN:  Are you having pain? Yes: NPRS scale: 3-4/10 current, 5/10 worst  Pain  location: R hip and thigh  Pain description: sore Aggravating factors: prolonged standing, driving, sitting with legs crossed on ground Relieving factors: changing positions  PRECAUTIONS: None  RED FLAGS: None   WEIGHT BEARING RESTRICTIONS: No  FALLS:  Has patient fallen in last 6 months? Yes. Number of falls >3  LIVING ENVIRONMENT: Lives with: lives with their family Lives in: House/apartment Stairs: No Has following equipment at home:  None  OCCUPATION: unemployed  PLOF: Independent  PATIENT GOALS: to have less pain in R hip with daily activities including standing, walking, prolonged sitting   NEXT MD VISIT: 10/01/2024  OBJECTIVE:  Note: Objective measures were completed at Evaluation unless otherwise noted.  DIAGNOSTIC FINDINGS:   Per Dr. Jeannetta, Hip xray looked good with no significant osteoarthritis or structural damage.  PATIENT SURVEYS:  LEFS  Extreme difficulty/unable (0), Quite a bit of difficulty (1), Moderate difficulty (2), Little difficulty (3), No difficulty (4) Survey date:  09/22/2024   Any of your usual work, housework or school activities 2  2. Usual hobbies, recreational or sporting activities 1  3. Getting into/out of the bath 3  4. Walking between rooms 4  5. Putting on socks/shoes 4  6. Squatting  2  7. Lifting an object, like a bag of groceries from the floor 4  8. Performing light activities around your home 4  9. Performing heavy activities around your home 4  10. Getting into/out of a car 2  11. Walking 2 blocks 2  12. Walking 1 mile 2  13. Going up/down 10 stairs (1 flight) 2  14. Standing for 1 hour 1  15.  sitting for 1 hour 1  16. Running on even ground 2  17. Running on uneven ground 1  18. Making sharp turns while running fast 0  19. Hopping  0  20. Rolling over in bed 3  Score total:  44/80     THE PATIENT SPECIFIC FUNCTIONAL SCALE  Place score of 0-10 (0 = unable to perform activity and 10 = able to perform activity at the same level as before injury or problem)  Activity Date: 09/22/24    Long Distance Driving (~2 hours) 5    2.     3.     4.      Total Score 5      Total Score = Sum of activity scores/number of activities  Minimally Detectable Change: 3 points (for single activity); 2 points (for average score)  [facility] (nd). The Patient Specific Functional Scale . Retrieved from  [email or web address]   COGNITION: Overall cognitive status: Within functional limits for tasks assessed     SENSATION: Not tested  LOWER EXTREMITY MMT:  MMT Right eval Left eval  Hip flexion 4- 4  Hip extension 3 3+  Hip abduction 3 4-  Hip adduction    Hip internal rotation 4- 4  Hip external rotation 3+ 4-  Knee flexion 4 4  Knee extension 3+ 4-  Ankle dorsiflexion 4 4  Ankle plantarflexion    Ankle inversion    Ankle eversion     (Blank rows = not tested)   LOWER EXTREMITY SPECIAL TESTS:  Hip special tests: deferred  FUNCTIONAL TESTS:  Deferred    Balance  09/22/24  Romberg EO on floor  30s  Romberg EC on floor 30s, mod sway  Romberg EO on airex   Romberg EC on airex     Balance Right 09/22/24 Left 09/22/24   Tandem on floor  20s 20s  SLS on floor      DGI Mild impairment: walks 20', uses assistive devices, slower speed, mild gait deviations. (2) Normal: Able to smoothly change walking speed without loss of balance or gait deviation. Shows significant difference in walking speeds between normal, fast and slow paces. (3) Moderate impairment: Performs head turns with moderate change in gait velocity, slows down, staggers, but recovers, can continue to walk. (1) Moderate impairment: Performs head turns with moderate change in gait velocity, slows down, staggers, but recovers, can continue to walk. (1) Mild impairment: pivot turns safely in >3 seconds and stops with no loss of balance. (2) Mild impairment: Is able to step over shoe box, but must slow down and adjust steps to clear box safely. (2) Normal: Is able to walk safely around cones safely without changing gait speed; no evidence of imbalance. (3) Moderate impairment: Two feet to a stair, must use rail. (1) Total Score: 15/24 Scores below 19 suggest that the subject assessed has a higher risk of prospective falls                                                                                                                                  TREATMENT DATE:   [facility] Adult PT Treatment:                                                DATE: 09/30/2024  Therapeutic Exercise: Bike level 2 x 5 mins Standing hip abduction/extension 2x10  Neuromuscular re-ed: Tandem stance BIL 2x30 SLS BIL x30 (UE support PRN) Toe Taps to 6 step  Side stepping with heel hang Side stepping with toe hang   Therapeutic Activity: Step-ups anterior/lateral 4 BIL 2x10  [facility] Adult PT Treatment:                                                DATE: 09/28/24 Therapeutic Exercise: Bike level 3 x 5 mins Standing hip abduction/extension 2x10 Supine bridges 2x10 Straight leg raise 2x10 RLE Seated FAQ with hip adduction ball squeeze Neuromuscular re-ed: Tandem stance BIL 2x30 SLS BIL x30 (UE support PRN) Therapeutic Activity: DGI Step-ups anterior/lateral 4 BIL 2x10  [facility] Adult PT Treatment:                                                DATE: 09/22/24   Initial evaluation: see patient education and home exercise program as noted below      PATIENT EDUCATION:  Education details: reviewed initial home exercise program; discussion  of POC, prognosis and goals for skilled PT   Person educated: Patient Education method: Explanation, Demonstration, and Handouts Education comprehension: verbalized understanding, returned demonstration, and needs further education  HOME EXERCISE PROGRAM: Access Code: [email or web address] URL: [email or web address] Date: 09/22/2024 Prepared by: Marko Molt  Exercises - Clamshell  - 1 x daily - 4 x weekly - 2 sets - 10 reps - Supine Active Straight Leg Raise  - 1 x daily - 4 x weekly - 2 sets - 10 reps - Supine Bridge  - 1 x daily - 4 x weekly - 2 sets - 10 reps - Supine Hip Adduction Isometric with Ball  - 1 x daily - 4 x weekly - 2 sets - 10 reps - 3 sec hold - Seated Long Arc Quad  - 1 x daily - 4 x weekly - 2  sets - 10 reps - 3 sec hold - Heel Toe Raises with Counter Support  - 1 x daily - 4 x weekly - 2 sets - 10 reps  ASSESSMENT:  CLINICAL IMPRESSION: Patient presents to PT reporting that she has soreness in her entire RLE and that she fell last week when her dog pulled on the leash, she did not hit her head and she did not have any increased pain in the right hip from this. Administered DGI this session with patient scoring 15-24, showing increased risk for falls. Patient demonstrated slight postural swaying with balance exercises but no significant LOB. Patient was able to tolerate all exercises, she reported a tightness in her right hip. Patient would benefit from skilled PT to improve strength and improve function.   Eval: Maygen is a 48 y.o. female who was seen today for physical therapy evaluation and treatment for persisted R hip pain with strength deficits. She has decreased MMT scores BIL. She is demonstrating diminished static standing balance with narrow BOS. Dynamic balance to be further assessed and f/u visit. She has related pain and difficulty with standing, walking, driving, and sitting with legs crossed. She requires skilled PT services at this time to address relevant deficits and improve overall function.     OBJECTIVE IMPAIRMENTS: decreased activity tolerance, decreased balance, decreased endurance, decreased strength, and pain.   ACTIVITY LIMITATIONS: carrying, sitting, standing, stairs, and locomotion level  PARTICIPATION LIMITATIONS: cleaning, interpersonal relationship, driving, shopping, community activity, and occupation  PERSONAL FACTORS: Time since onset of injury/illness/exacerbation and 3+ comorbidities: Relevant PMHx includes fibromyalgia, scoliosis, peripheral neuropathy, BIL knee pain, vitamin D  deficiency are also affecting patient's functional outcome.   REHAB POTENTIAL: Fair    CLINICAL DECISION MAKING: Evolving/moderate complexity  EVALUATION COMPLEXITY:  Moderate   GOALS: Goals reviewed with patient? YES  SHORT TERM GOALS: Target date: 10/20/2024   Patient will be independent with initial home program at least 3 days/week.  Baseline: provided at eval Goal Status: INITIAL   2.  Patient will demonstrate improved BIL LE MMT scores to at least 4+/5.  Baseline:  Goal Status: INITIAL     LONG TERM GOALS: Target date: 11/17/2024   Patient will report improved overall functional ability with LEFS score of 60/80 or greater.  Baseline: 44/80 Goal Status: INITIAL    2.  Patient will report 7/10 or greater PSFS score for long distance driving.  Baseline: 5/10 Goal status: INITIAL  3.  Patient will have 20/24 on DGI assessment in order to demonstrate decreased fall risk.  Baseline: to be assessed at f/u visit Goal status: INITIAL      PLAN:  PT FREQUENCY:  1-2x/week  PT DURATION: 8 weeks  PLANNED INTERVENTIONS: 97164- PT Re-evaluation, 97750- Physical Performance Testing, 97110-Therapeutic exercises, 97530- Therapeutic activity, 97112- Neuromuscular re-education, 97535- Self Care, 02859- Manual therapy, [phone]- Aquatic Therapy, [phone]- Electrical stimulation (unattended), [phone]- Electrical stimulation (manual), 20560 (1-2 muscles), 20561 (3+ muscles)- Dry Needling, Patient/Family education, Balance training, Taping, Joint mobilization, Cryotherapy, and Moist heat  PLAN FOR NEXT SESSION: DGI assessment; address LQ strength, pain modulation with manual therapy, modalities and low-impact aerobic activity    Shanda Code, SPTA  09/30/2024 11:32 AM

## 2024-10-01 ENCOUNTER — Ambulatory Visit (INDEPENDENT_AMBULATORY_CARE_PROVIDER_SITE_OTHER): Admitting: Physician Assistant

## 2024-10-01 ENCOUNTER — Encounter: Payer: Self-pay | Admitting: Physician Assistant

## 2024-10-01 VITALS — BP 110/68 | HR 88 | Temp 97.6°F | Ht 65.0 in | Wt 136.0 lb

## 2024-10-01 DIAGNOSIS — Z23 Encounter for immunization: Secondary | ICD-10-CM | POA: Diagnosis not present

## 2024-10-01 DIAGNOSIS — K580 Irritable bowel syndrome with diarrhea: Secondary | ICD-10-CM | POA: Diagnosis not present

## 2024-10-01 DIAGNOSIS — E559 Vitamin D deficiency, unspecified: Secondary | ICD-10-CM | POA: Diagnosis not present

## 2024-10-01 DIAGNOSIS — R5381 Other malaise: Secondary | ICD-10-CM

## 2024-10-01 DIAGNOSIS — E782 Mixed hyperlipidemia: Secondary | ICD-10-CM | POA: Diagnosis not present

## 2024-10-01 DIAGNOSIS — M797 Fibromyalgia: Secondary | ICD-10-CM

## 2024-10-01 DIAGNOSIS — K219 Gastro-esophageal reflux disease without esophagitis: Secondary | ICD-10-CM | POA: Diagnosis not present

## 2024-10-01 DIAGNOSIS — Z1231 Encounter for screening mammogram for malignant neoplasm of breast: Secondary | ICD-10-CM

## 2024-10-01 NOTE — Progress Notes (Signed)
 Subjective:  Patient ID: Sarah [name], female    DOB: [birth year]  Age: 48 y.o. MRN: [id number]  Chief Complaint  Patient presents with   Medical Management of Chronic Issues    HPI  Pt in for follow up of fibromyalgia - she states she is doing well on cymbalta  60mg  daily  She also uses celebrex  200mg  qd  Pt with  vit D def and has been on weekly supplements - due for labwork  Pt with  GERD and hiatal hernia - stable on pepcid 40 and protonix  40mg   Pt with IBS - stable on bentyl  10mg   Pt with hyperlipidemia - she is due for repeat labwork - pt has not wanted to start statin and is trying to watch diet to help improve  Pt would like flu shot today  Pt is due for mammogram Current Outpatient Medications on File Prior to Visit  Medication Sig Dispense Refill   celecoxib  (CELEBREX ) 200 MG capsule TAKE 1 CAPSULE (200 MG TOTAL) BY MOUTH 2 (TWO) TIMES DAILY AS NEEDED. 180 capsule 1   dicyclomine  (BENTYL ) 10 MG capsule TAKE 1 CAPSULE (10 MG TOTAL) BY MOUTH 3 (THREE) TIMES DAILY BEFORE MEALS. 270 capsule 1   DULoxetine  (CYMBALTA ) 60 MG capsule Take 1 capsule (60 mg total) by mouth daily. 90 capsule 1   famotidine (PEPCID) 40 MG tablet Take 40 mg by mouth 2 (two) times daily.     hydrocortisone  (ANUSOL -HC) 25 MG suppository Place 1 suppository (25 mg total) rectally 2 (two) times daily. (Patient taking differently: Place 25 mg rectally 2 (two) times daily as needed for hemorrhoids.) 12 suppository 2   pantoprazole  (PROTONIX ) 40 MG tablet TAKE 1 TABLET BY MOUTH EVERY DAY 90 tablet 1   Vitamin D , Ergocalciferol , (DRISDOL ) 1.25 MG (50000 UNIT) CAPS capsule TAKE 1 CAPSULE (50,000 UNITS TOTAL) BY MOUTH EVERY 7 (SEVEN) DAYS 5 capsule 5   No current facility-administered medications on file prior to visit.   Past Medical History:  Diagnosis Date   Carpal tunnel syndrome    Fibromyalgia    GERD (gastroesophageal reflux disease)    Hernia, hiatal    Hypercalcemia 01/06/2021   Left breast  mass 01/26/2021   Liver cyst    Malaise 01/06/2021   Need for tetanus, diphtheria, and acellular pertussis (Tdap) vaccine 01/06/2021   Palpitations 01/26/2021   Polyuria 01/06/2021   Scoliosis    Weight loss 01/02/2021   Past Surgical History:  Procedure Laterality Date   CARPAL TUNNEL RELEASE Right 07/01/2023   Procedure: right carpal tunnel release;  Surgeon: Murrell Drivers, MD;  Location: [facility];  Service: Orthopedics;  Laterality: Right;   CARPAL TUNNEL RELEASE Left 11/11/2023   Procedure: LEFT CARPAL TUNNEL RELEASE;  Surgeon: Murrell Drivers, MD;  Location: [facility];  Service: Orthopedics;  Laterality: Left;  30 MIN   CERVICAL SPINE SURGERY     Left fooscrew and plate k7u      TUBAL LIGATION      Family History  Problem Relation Age of Onset   Heart disease Mother    Hyperlipidemia Mother    Anxiety disorder Sister    GER disease Son    Asthma Son    GER disease Son    Heart disease Son    Social History   Socioeconomic History   Marital status: Single    Spouse name: Not on file   Number of children: Not on file   Years of education: Not on file  Highest education level: Some college, no degree  Occupational History   Not on file  Tobacco Use   Smoking status: Every Day    Current packs/day: 1.00    Average packs/day: 1 pack/day for 10.0 years (10.0 ttl pk-yrs)    Types: Cigarettes    Passive exposure: Never   Smokeless tobacco: Never  Vaping Use   Vaping status: Never Used  Substance and Sexual Activity   Alcohol use: Not Currently   Drug use: Never   Sexual activity: Not Currently  Other Topics Concern   Not on file  Social History Narrative   Not on file   Social Drivers of Health   Financial Resource Strain: Low Risk  (09/30/2024)   Overall Financial Resource Strain (CARDIA)    Difficulty of Paying Living Expenses: Not hard at all  Food Insecurity: No Food Insecurity (09/30/2024)   Hunger Vital Sign    Worried About  Running Out of Food in the Last Year: Never true    Ran Out of Food in the Last Year: Never true  Transportation Needs: No Transportation Needs (09/30/2024)   PRAPARE - Administrator, Civil Service (Medical): No    Lack of Transportation (Non-Medical): No  Physical Activity: Inactive (09/30/2024)   Exercise Vital Sign    Days of Exercise per Week: 0 days    Minutes of Exercise per Session: Not on file  Stress: No Stress Concern Present (09/30/2024)   [employer] of Occupational Health - Occupational Stress Questionnaire    Feeling of Stress: Not at all  Social Connections: Socially Isolated (09/30/2024)   Social Connection and Isolation Panel    Frequency of Communication with Friends and Family: Once a week    Frequency of Social Gatherings with Friends and Family: Once a week    Attends Religious Services: Never    Database Administrator or Organizations: No    Attends Engineer, Structural: Not on file    Marital Status: Divorced   CONSTITUTIONAL: Negative for chills, fatigue, fever, E/N/T: Negative for ear pain, nasal congestion and sore throat.  CARDIOVASCULAR: Negative for chest pain, dizziness, palpitations and pedal edema.  RESPIRATORY: Negative for recent cough and dyspnea.  GASTROINTESTINAL: Negative for abdominal pain, acid reflux symptoms, constipation, diarrhea, nausea and vomiting.  MSK: is being seen by specialist and getting PT for leg weakness/pain INTEGUMENTARY: Negative for rash.  NEUROLOGICAL: Negative for dizziness and headaches.  PSYCHIATRIC: Negative for sleep disturbance and to question depression screen.  Negative for depression, negative for anhedonia.        Objective:  PHYSICAL EXAM:   VS: BP 110/68 (BP Location: Left Arm, Patient Position: Sitting)   Pulse 88   Temp 97.6 F (36.4 C) (Temporal)   Ht 5' 5 (1.651 m)   Wt 136 lb (61.7 kg)   SpO2 99%   BMI 22.63 kg/m   GEN: Well nourished, well developed, in no acute  distress  Cardiac: RRR; no murmurs, rubs, or gallops,no edema - Respiratory:  normal respiratory rate and pattern with no distress - normal breath sounds with no rales, rhonchi, wheezes or rubs Skin: warm and dry, no rash  Neuro:  Alert and Oriented x 3, - CN II-Xii grossly intact Psych: euthymic mood, appropriate affect and demeanor  Diabetic Foot Exam - Simple   No data filed      Lab Results  Component Value Date   WBC 5.4 03/30/2024   HGB 14.5 03/30/2024   HCT 43.4 03/30/2024  PLT 282 03/30/2024   GLUCOSE 82 03/30/2024   CHOL 231 (H) 03/30/2024   TRIG 90 03/30/2024   HDL 67 03/30/2024   LDLCALC 148 (H) 03/30/2024   ALT 16 03/30/2024   AST 19 03/30/2024   NA 140 03/30/2024   K 5.2 03/30/2024   CL 104 03/30/2024   CREATININE 0.77 03/30/2024   BUN 6 03/30/2024   CO2 23 03/30/2024   TSH 1.690 03/30/2024   INR 1.0 11/22/2020   HGBA1C 5.3 09/27/2023      Assessment & Plan:   Problem List Items Addressed This Visit       Digestive   Gastroesophageal reflux disease without esophagitis Continue meds     Other   Other fatigue   Relevant Orders   CBC with Differential/Platelet   Comprehensive metabolic panel   TSH    Fibromyalgia - Primary   Continue follow up with Dr Jeannetta      IBS Continue bentyl   Hyperlipidemia Lipid panel pending Continue to watch diet  Vit D def Labwork pending Continue supplement  Need flu shot Fluzone given  Breast cancer screening Mammogram scheduled           .  No orders of the defined types were placed in this encounter.   Orders Placed This Encounter  Procedures   MM 3D SCREENING MAMMOGRAM BILATERAL BREAST   Flu vaccine trivalent PF, 6mos and older(Flulaval,Afluria,Fluarix,Fluzone)   CBC with Differential/Platelet   Comprehensive metabolic panel with GFR   TSH   Lipid panel   VITAMIN D  25 Hydroxy (Vit-D Deficiency, Fractures)     Follow-up: Return in about 6 months (around 03/31/2025) for chronic fasting  follow-up.  An After Visit Summary was printed and given to the patient.  CAMIE JONELLE NICHOLAUS DEVONNA [facility] [phone]

## 2024-10-02 ENCOUNTER — Ambulatory Visit: Payer: Self-pay | Admitting: Physician Assistant

## 2024-10-02 ENCOUNTER — Other Ambulatory Visit: Payer: Self-pay | Admitting: Physician Assistant

## 2024-10-02 DIAGNOSIS — R899 Unspecified abnormal finding in specimens from other organs, systems and tissues: Secondary | ICD-10-CM

## 2024-10-02 LAB — CBC WITH DIFFERENTIAL/PLATELET
Basophils Absolute: 0 x10E3/uL (ref 0.0–0.2)
Basos: 1 %
EOS (ABSOLUTE): 0.2 x10E3/uL (ref 0.0–0.4)
Eos: 3 %
Hematocrit: 43.9 % (ref 34.0–46.6)
Hemoglobin: 14.3 g/dL (ref 11.1–15.9)
Immature Grans (Abs): 0 x10E3/uL (ref 0.0–0.1)
Immature Granulocytes: 0 %
Lymphocytes Absolute: 2.2 x10E3/uL (ref 0.7–3.1)
Lymphs: 35 %
MCH: 30 pg (ref 26.6–33.0)
MCHC: 32.6 g/dL (ref 31.5–35.7)
MCV: 92 fL (ref 79–97)
Monocytes Absolute: 0.4 x10E3/uL (ref 0.1–0.9)
Monocytes: 7 %
Neutrophils Absolute: 3.5 x10E3/uL (ref 1.4–7.0)
Neutrophils: 54 %
Platelets: 297 x10E3/uL (ref 150–450)
RBC: 4.76 x10E6/uL (ref 3.77–5.28)
RDW: 13.4 % (ref 11.7–15.4)
WBC: 6.4 x10E3/uL (ref 3.4–10.8)

## 2024-10-02 LAB — COMPREHENSIVE METABOLIC PANEL WITH GFR
ALT: 25 IU/L (ref 0–32)
AST: 25 IU/L (ref 0–40)
Albumin: 4.5 g/dL (ref 3.9–4.9)
Alkaline Phosphatase: 81 IU/L (ref 41–116)
BUN/Creatinine Ratio: 12 (ref 9–23)
BUN: 11 mg/dL (ref 6–24)
Bilirubin Total: 0.3 mg/dL (ref 0.0–1.2)
CO2: 25 mmol/L (ref 20–29)
Calcium: 10.7 mg/dL — ABNORMAL HIGH (ref 8.7–10.2)
Chloride: 103 mmol/L (ref 96–106)
Creatinine, Ser: 0.92 mg/dL (ref 0.57–1.00)
Globulin, Total: 2.5 g/dL (ref 1.5–4.5)
Glucose: 78 mg/dL (ref 70–99)
Potassium: 5.1 mmol/L (ref 3.5–5.2)
Sodium: 141 mmol/L (ref 134–144)
Total Protein: 7 g/dL (ref 6.0–8.5)
eGFR: 77 mL/min/1.73 (ref >59)

## 2024-10-02 LAB — LIPID PANEL
Chol/HDL Ratio: 2.7 ratio (ref 0.0–4.4)
Cholesterol, Total: 232 mg/dL — ABNORMAL HIGH (ref 100–199)
HDL: 87 mg/dL (ref >39)
LDL Chol Calc (NIH): 132 mg/dL — ABNORMAL HIGH (ref 0–99)
Triglycerides: 78 mg/dL (ref 0–149)
VLDL Cholesterol Cal: 13 mg/dL (ref 5–40)

## 2024-10-02 LAB — TSH: TSH: 1.46 u[IU]/mL (ref 0.450–4.500)

## 2024-10-02 LAB — VITAMIN D 25 HYDROXY (VIT D DEFICIENCY, FRACTURES): Vit D, 25-Hydroxy: 46 ng/mL (ref 30.0–100.0)

## 2024-10-05 ENCOUNTER — Encounter (HOSPITAL_BASED_OUTPATIENT_CLINIC_OR_DEPARTMENT_OTHER): Payer: Self-pay | Admitting: Radiology

## 2024-10-05 ENCOUNTER — Ambulatory Visit (HOSPITAL_BASED_OUTPATIENT_CLINIC_OR_DEPARTMENT_OTHER)
Admission: RE | Admit: 2024-10-05 | Discharge: 2024-10-05 | Disposition: A | Source: Ambulatory Visit | Attending: Physician Assistant | Admitting: Physician Assistant

## 2024-10-05 ENCOUNTER — Ambulatory Visit

## 2024-10-05 ENCOUNTER — Other Ambulatory Visit: Payer: Self-pay | Admitting: Physician Assistant

## 2024-10-05 ENCOUNTER — Ambulatory Visit: Payer: Self-pay | Admitting: Cardiology

## 2024-10-05 DIAGNOSIS — Z1231 Encounter for screening mammogram for malignant neoplasm of breast: Secondary | ICD-10-CM

## 2024-10-05 DIAGNOSIS — E782 Mixed hyperlipidemia: Secondary | ICD-10-CM

## 2024-10-05 MED ORDER — EZETIMIBE 10 MG PO TABS: 10.0000 mg | ORAL_TABLET | Freq: Every day | ORAL | 3 refills | Status: DC

## 2024-10-06 ENCOUNTER — Other Ambulatory Visit

## 2024-10-06 MED ORDER — METOPROLOL SUCCINATE ER 25 MG PO TB24: 12.5000 mg | ORAL_TABLET | Freq: Every day | ORAL | 3 refills | Status: DC

## 2024-10-07 ENCOUNTER — Ambulatory Visit

## 2024-10-07 DIAGNOSIS — M25551 Pain in right hip: Secondary | ICD-10-CM

## 2024-10-07 LAB — PTH, INTACT AND CALCIUM
Calcium: 11.2 mg/dL — ABNORMAL HIGH (ref 8.7–10.2)
PTH: 26 pg/mL (ref 15–65)

## 2024-10-07 NOTE — Therapy (Addendum)
 OUTPATIENT PHYSICAL THERAPY TREATMENT   Patient Name: Sarah [name] MRN: [id number] DOB:01-25-1976, 48 y.o., female Today's Date: 10/07/2024  END OF SESSION:  PT End of Session - 10/07/24 0827     Visit Number 4    Number of Visits 16    Date for Recertification  11/17/24    Authorization Type BCBS    PT Start Time [phone]    PT Stop Time 0906    PT Time Calculation (min) 40 min    Activity Tolerance Patient tolerated treatment well    Behavior During Therapy [facility] for tasks assessed/performed         Past Medical History:  Diagnosis Date   Carpal tunnel syndrome    Fibromyalgia    GERD (gastroesophageal reflux disease)    Hernia, hiatal    Hypercalcemia 01/06/2021   Left breast mass 01/26/2021   Liver cyst    Malaise 01/06/2021   Need for tetanus, diphtheria, and acellular pertussis (Tdap) vaccine 01/06/2021   Palpitations 01/26/2021   Polyuria 01/06/2021   Scoliosis    Weight loss 01/02/2021   Past Surgical History:  Procedure Laterality Date   CARPAL TUNNEL RELEASE Right 07/01/2023   Procedure: right carpal tunnel release;  Surgeon: Murrell Drivers, MD;  Location: [facility];  Service: Orthopedics;  Laterality: Right;   CARPAL TUNNEL RELEASE Left 11/11/2023   Procedure: LEFT CARPAL TUNNEL RELEASE;  Surgeon: Murrell Drivers, MD;  Location: [facility];  Service: Orthopedics;  Laterality: Left;  30 MIN   CERVICAL SPINE SURGERY     Left fooscrew and plate [id number]      TUBAL LIGATION     Patient Active Problem List   Diagnosis Date Noted   Peripheral neuropathy 03/12/2024   Bilateral knee pain 08/23/2023   Flank pain 03/18/2023   Family history of elevated blood lipids 03/18/2023   Screening for colon cancer 09/10/2022   Gastroesophageal reflux disease without esophagitis 09/10/2022   Needs flu shot 09/10/2022   Vitamin D  deficiency 08/13/2022   Pain in right hip 08/13/2022   Facial rash 02/08/2022   Fibromyalgia 11/09/2021   Fibromyalgia  syndrome 11/09/2021   Positive ANA (antinuclear antibody) 10/12/2021   Impingement syndrome of right shoulder 07/07/2021   Labral tear of shoulder, degenerative, right 07/07/2021   Carpal tunnel syndrome, bilateral 07/07/2021   DDD (degenerative disc disease), cervical 07/07/2021   Other fatigue 05/08/2021   Leg cramps 05/08/2021   Frequency of urination 05/08/2021   Irregular menses 05/08/2021   Scoliosis    Palpitations 01/26/2021   Left breast mass 01/26/2021   Polyuria 01/06/2021   Hypercalcemia 01/06/2021   Malaise 01/06/2021   Need for tetanus, diphtheria, and acellular pertussis (Tdap) vaccine 01/06/2021   Weight loss 01/02/2021   Hyperglycemia 01/02/2021    PCP: Nicholaus Credit, PA-C  REFERRING PROVIDER:  Jeannetta Lonni ORN, MD     REFERRING DIAG:  M77.9 (ICD-10-CM) - Tendinitis  M70.71 (ICD-10-CM) - Bursitis of right hip, unspecified bursa  M25.551 (ICD-10-CM) - Pain in right hip    THERAPY DIAG:  Pain in right hip  Rationale for Evaluation and Treatment: Rehabilitation  ONSET DATE: >6 months   SUBJECTIVE:   SUBJECTIVE STATEMENT: Patient stated that overall her hip feels okay, other than when she sits for long periods of time. Patient states that her legs have been aching. She also states that she was put onto two new medications, once for cholesterol and one for her heart, unsure of names.  Eval: Patient reports  to PT d/t R hip pain that has been present for >6 months. She does not recall any hip related injuries. She notes that she has started to notice her R foot rolling in (pronation) with any standing. She states that this has already been happening on her L foot for years. She has history of knee pain. Her understanding is that she has some rotation and bone spurs on R side.     PERTINENT HISTORY: Relevant PMHx includes fibromyalgia, scoliosis, peripheral neuropathy, BIL knee pain, vitamin D  deficiency  PAIN:  Are you having pain? Yes: NPRS scale:  3-4/10 current, 5/10 worst  Pain location: R hip and thigh  Pain description: sore Aggravating factors: prolonged standing, driving, sitting with legs crossed on ground Relieving factors: changing positions  PRECAUTIONS: None  RED FLAGS: None   WEIGHT BEARING RESTRICTIONS: No  FALLS:  Has patient fallen in last 6 months? Yes. Number of falls >3  LIVING ENVIRONMENT: Lives with: lives with their family Lives in: House/apartment Stairs: No Has following equipment at home: None  OCCUPATION: unemployed  PLOF: Independent  PATIENT GOALS: to have less pain in R hip with daily activities including standing, walking, prolonged sitting   NEXT MD VISIT: 10/01/2024  OBJECTIVE:  Note: Objective measures were completed at Evaluation unless otherwise noted.  DIAGNOSTIC FINDINGS:   Per Dr. Jeannetta, Hip xray looked good with no significant osteoarthritis or structural damage.  PATIENT SURVEYS:  LEFS  Extreme difficulty/unable (0), Quite a bit of difficulty (1), Moderate difficulty (2), Little difficulty (3), No difficulty (4) Survey date:  09/22/2024   Any of your usual work, housework or school activities 2  2. Usual hobbies, recreational or sporting activities 1  3. Getting into/out of the bath 3  4. Walking between rooms 4  5. Putting on socks/shoes 4  6. Squatting  2  7. Lifting an object, like a bag of groceries from the floor 4  8. Performing light activities around your home 4  9. Performing heavy activities around your home 4  10. Getting into/out of a car 2  11. Walking 2 blocks 2  12. Walking 1 mile 2  13. Going up/down 10 stairs (1 flight) 2  14. Standing for 1 hour 1  15.  sitting for 1 hour 1  16. Running on even ground 2  17. Running on uneven ground 1  18. Making sharp turns while running fast 0  19. Hopping  0  20. Rolling over in bed 3  Score total:  44/80     THE PATIENT SPECIFIC FUNCTIONAL SCALE  Place score of 0-10 (0 = unable to perform activity and  10 = able to perform activity at the same level as before injury or problem)  Activity Date: 09/22/24    Long Distance Driving (~2 hours) 5    2.     3.     4.      Total Score 5      Total Score = Sum of activity scores/number of activities  Minimally Detectable Change: 3 points (for single activity); 2 points (for average score)  [facility] (nd). The Patient Specific Functional Scale . Retrieved from [email or web address]   COGNITION: Overall cognitive status: Within functional limits for tasks assessed     SENSATION: Not tested  LOWER EXTREMITY MMT:  MMT Right eval Left eval  Hip flexion 4- 4  Hip extension 3 3+  Hip abduction 3 4-  Hip adduction    Hip internal rotation  4- 4  Hip external rotation 3+ 4-  Knee flexion 4 4  Knee extension 3+ 4-  Ankle dorsiflexion 4 4  Ankle plantarflexion    Ankle inversion    Ankle eversion     (Blank rows = not tested)   LOWER EXTREMITY SPECIAL TESTS:  Hip special tests: deferred  FUNCTIONAL TESTS:  Deferred    Balance  09/22/24  Romberg EO on floor  30s  Romberg EC on floor 30s, mod sway  Romberg EO on airex   Romberg EC on airex     Balance Right 09/22/24 Left 09/22/24   Tandem on floor 20s 20s  SLS on floor      DGI Mild impairment: walks 20', uses assistive devices, slower speed, mild gait deviations. (2) Normal: Able to smoothly change walking speed without loss of balance or gait deviation. Shows significant difference in walking speeds between normal, fast and slow paces. (3) Moderate impairment: Performs head turns with moderate change in gait velocity, slows down, staggers, but recovers, can continue to walk. (1) Moderate impairment: Performs head turns with moderate change in gait velocity, slows down, staggers, but recovers, can continue to walk. (1) Mild impairment: pivot turns safely in >3 seconds and stops with no loss of balance.  (2) Mild impairment: Is able to step over shoe box, but must slow down and adjust steps to clear box safely. (2) Normal: Is able to walk safely around cones safely without changing gait speed; no evidence of imbalance. (3) Moderate impairment: Two feet to a stair, must use rail. (1) Total Score: 15/24 Scores below 19 suggest that the subject assessed has a higher risk of prospective falls                                                                                                                                 TREATMENT DATE:  [facility] Adult PT Treatment:                                                DATE: 10/07/24 Therapeutic Exercise: Bike level 3 x 5 mins Standing 3 way hip x10 BIL Standing heel/toe raises 2x10 Neuromuscular re-ed: Tandem stance BIL 2x30 SLS BIL x30 (UE support PRN) Cone taps x3 SLS 2x30 BIL  Therapeutic Activity: Step-ups anterior/lateral 4 BIL 2x10 Side stepping airex balance beam with heel hang x4 laps Side stepping airex balance beam with toe hang x4 laps  [facility] Adult PT Treatment:                                                DATE: 09/30/2024  Therapeutic Exercise: Bike level 2 x 5 mins Standing hip abduction/extension  2x10  Neuromuscular re-ed: Tandem stance BIL 2x30 SLS BIL x30 (UE support PRN) Toe Taps to 6 step   Therapeutic Activity: Step-ups anterior/lateral 4 BIL 2x10 Side stepping with heel hang Side stepping with toe hang   [facility] Adult PT Treatment:                                                DATE: 09/28/24 Therapeutic Exercise: Bike level 3 x 5 mins Standing hip abduction/extension 2x10 Supine bridges 2x10 Straight leg raise 2x10 RLE Seated FAQ with hip adduction ball squeeze Neuromuscular re-ed: Tandem stance BIL 2x30 SLS BIL x30 (UE support PRN) Therapeutic Activity: DGI Step-ups anterior/lateral 4 BIL 2x10      PATIENT EDUCATION:  Education details: reviewed initial home exercise program; discussion of POC,  prognosis and goals for skilled PT   Person educated: Patient Education method: Explanation, Demonstration, and Handouts Education comprehension: verbalized understanding, returned demonstration, and needs further education  HOME EXERCISE PROGRAM: Access Code: [email or web address] URL: [email or web address] Date: 09/22/2024 Prepared by: Marko Molt  Exercises - Clamshell  - 1 x daily - 4 x weekly - 2 sets - 10 reps - Supine Active Straight Leg Raise  - 1 x daily - 4 x weekly - 2 sets - 10 reps - Supine Bridge  - 1 x daily - 4 x weekly - 2 sets - 10 reps - Supine Hip Adduction Isometric with Ball  - 1 x daily - 4 x weekly - 2 sets - 10 reps - 3 sec hold - Seated Long Arc Quad  - 1 x daily - 4 x weekly - 2 sets - 10 reps - 3 sec hold - Heel Toe Raises with Counter Support  - 1 x daily - 4 x weekly - 2 sets - 10 reps  ASSESSMENT:  CLINICAL IMPRESSION: Patient presented to PT reporting having more of an aching feeling in her legs today. Today's session focused on balance activities and LE strengthening. She had the most difficulty with tandem stance RLE in the back, and required occasional UE support with all balance exercises. Patient tolerated all exercises with increased challenge, some increase in soreness in the R hip. Patient will benefit from skilled PT in order to increase functional mobility.   Eval: Loghan is a 48 y.o. female who was seen today for physical therapy evaluation and treatment for persisted R hip pain with strength deficits. She has decreased MMT scores BIL. She is demonstrating diminished static standing balance with narrow BOS. Dynamic balance to be further assessed and f/u visit. She has related pain and difficulty with standing, walking, driving, and sitting with legs crossed. She requires skilled PT services at this time to address relevant deficits and improve overall function.     OBJECTIVE IMPAIRMENTS: decreased activity tolerance, decreased balance,  decreased endurance, decreased strength, and pain.   ACTIVITY LIMITATIONS: carrying, sitting, standing, stairs, and locomotion level  PARTICIPATION LIMITATIONS: cleaning, interpersonal relationship, driving, shopping, community activity, and occupation  PERSONAL FACTORS: Time since onset of injury/illness/exacerbation and 3+ comorbidities: Relevant PMHx includes fibromyalgia, scoliosis, peripheral neuropathy, BIL knee pain, vitamin D  deficiency are also affecting patient's functional outcome.   REHAB POTENTIAL: Fair    CLINICAL DECISION MAKING: Evolving/moderate complexity  EVALUATION COMPLEXITY: Moderate   GOALS: Goals reviewed with patient? YES  SHORT TERM GOALS: Target date: 10/20/2024   Patient will be independent  with initial home program at least 3 days/week.  Baseline: provided at eval Goal Status: INITIAL   2.  Patient will demonstrate improved BIL LE MMT scores to at least 4+/5.  Baseline:  Goal Status: INITIAL     LONG TERM GOALS: Target date: 11/17/2024   Patient will report improved overall functional ability with LEFS score of 60/80 or greater.  Baseline: 44/80 Goal Status: INITIAL    2.  Patient will report 7/10 or greater PSFS score for long distance driving.  Baseline: 5/10 Goal status: INITIAL  3.  Patient will have 20/24 on DGI assessment in order to demonstrate decreased fall risk.  Baseline: to be assessed at f/u visit Goal status: INITIAL      PLAN:  PT FREQUENCY: 1-2x/week  PT DURATION: 8 weeks  PLANNED INTERVENTIONS: [id number]- PT Re-evaluation, 97750- Physical Performance Testing, 97110-Therapeutic exercises, 97530- Therapeutic activity, 97112- Neuromuscular re-education, 97535- Self Care, [id number]- Manual therapy, [phone]- Aquatic Therapy, [id number]- Electrical stimulation (unattended), [phone]- Electrical stimulation (manual), 20560 (1-2 muscles), 20561 (3+ muscles)- Dry Needling, Patient/Family education, Balance training, Taping, Joint  mobilization, Cryotherapy, and Moist heat  PLAN FOR NEXT SESSION: DGI assessment; address LQ strength, pain modulation with manual therapy, modalities and low-impact aerobic activity    Corean Pouch PTA  10/07/2024 9:03 AM

## 2024-10-12 ENCOUNTER — Ambulatory Visit: Payer: Self-pay | Admitting: Physician Assistant

## 2024-10-12 ENCOUNTER — Ambulatory Visit

## 2024-10-12 DIAGNOSIS — M25551 Pain in right hip: Secondary | ICD-10-CM | POA: Diagnosis not present

## 2024-10-12 NOTE — Therapy (Addendum)
 OUTPATIENT PHYSICAL THERAPY TREATMENT   Patient Name: Sarah [name] MRN: [id number] DOB:02/14/1976, 48 y.o., female Today's Date: 10/12/2024  END OF SESSION:  PT End of Session - 10/12/24 1132     Visit Number 5    Number of Visits 16    Date for Recertification  11/17/24    Authorization Type BCBS    PT Start Time 1131    PT Stop Time 1211    PT Time Calculation (min) 40 min    Activity Tolerance Patient tolerated treatment well    Behavior During Therapy [facility] for tasks assessed/performed          Past Medical History:  Diagnosis Date   Carpal tunnel syndrome    Fibromyalgia    GERD (gastroesophageal reflux disease)    Hernia, hiatal    Hypercalcemia 01/06/2021   Left breast mass 01/26/2021   Liver cyst    Malaise 01/06/2021   Need for tetanus, diphtheria, and acellular pertussis (Tdap) vaccine 01/06/2021   Palpitations 01/26/2021   Polyuria 01/06/2021   Scoliosis    Weight loss 01/02/2021   Past Surgical History:  Procedure Laterality Date   CARPAL TUNNEL RELEASE Right 07/01/2023   Procedure: right carpal tunnel release;  Surgeon: Murrell Drivers, MD;  Location: [facility];  Service: Orthopedics;  Laterality: Right;   CARPAL TUNNEL RELEASE Left 11/11/2023   Procedure: LEFT CARPAL TUNNEL RELEASE;  Surgeon: Murrell Drivers, MD;  Location:  SURGERY CENTER;  Service: Orthopedics;  Laterality: Left;  30 MIN   CERVICAL SPINE SURGERY     Left fooscrew and plate [id number]      TUBAL LIGATION     Patient Active Problem List   Diagnosis Date Noted   Peripheral neuropathy 03/12/2024   Bilateral knee pain 08/23/2023   Flank pain 03/18/2023   Family history of elevated blood lipids 03/18/2023   Screening for colon cancer 09/10/2022   Gastroesophageal reflux disease without esophagitis 09/10/2022   Needs flu shot 09/10/2022   Vitamin D  deficiency 08/13/2022   Pain in right hip 08/13/2022   Facial rash 02/08/2022   Fibromyalgia 11/09/2021   Fibromyalgia  syndrome 11/09/2021   Positive ANA (antinuclear antibody) 10/12/2021   Impingement syndrome of right shoulder 07/07/2021   Labral tear of shoulder, degenerative, right 07/07/2021   Carpal tunnel syndrome, bilateral 07/07/2021   DDD (degenerative disc disease), cervical 07/07/2021   Other fatigue 05/08/2021   Leg cramps 05/08/2021   Frequency of urination 05/08/2021   Irregular menses 05/08/2021   Scoliosis    Palpitations 01/26/2021   Left breast mass 01/26/2021   Polyuria 01/06/2021   Hypercalcemia 01/06/2021   Malaise 01/06/2021   Need for tetanus, diphtheria, and acellular pertussis (Tdap) vaccine 01/06/2021   Weight loss 01/02/2021   Hyperglycemia 01/02/2021    PCP: Nicholaus Credit, PA-C  REFERRING PROVIDER:  Jeannetta Lonni ORN, MD     REFERRING DIAG:  M77.9 (ICD-10-CM) - Tendinitis  M70.71 (ICD-10-CM) - Bursitis of right hip, unspecified bursa  M25.551 (ICD-10-CM) - Pain in right hip    THERAPY DIAG:  Pain in right hip  Rationale for Evaluation and Treatment: Rehabilitation  ONSET DATE: >6 months   SUBJECTIVE:   SUBJECTIVE STATEMENT: Patient states that she was sore after her last session. She also states that she has been having a hard time sleeping.  Eval: Patient reports to PT d/t R hip pain that has been present for >6 months. She does not recall any hip related injuries. She notes that she  has started to notice her R foot rolling in (pronation) with any standing. She states that this has already been happening on her L foot for years. She has history of knee pain. Her understanding is that she has some rotation and bone spurs on R side.     PERTINENT HISTORY: Relevant PMHx includes fibromyalgia, scoliosis, peripheral neuropathy, BIL knee pain, vitamin D  deficiency  PAIN:  Are you having pain? Yes: NPRS scale: 3-4/10 current, 5/10 worst  Pain location: R hip and thigh  Pain description: sore Aggravating factors: prolonged standing, driving, sitting with  legs crossed on ground Relieving factors: changing positions  PRECAUTIONS: None  RED FLAGS: None   WEIGHT BEARING RESTRICTIONS: No  FALLS:  Has patient fallen in last 6 months? Yes. Number of falls >3  LIVING ENVIRONMENT: Lives with: lives with their family Lives in: House/apartment Stairs: No Has following equipment at home: None  OCCUPATION: unemployed  PLOF: Independent  PATIENT GOALS: to have less pain in R hip with daily activities including standing, walking, prolonged sitting   NEXT MD VISIT: 10/01/2024  OBJECTIVE:  Note: Objective measures were completed at Evaluation unless otherwise noted.  DIAGNOSTIC FINDINGS:   Per Dr. Jeannetta, Hip xray looked good with no significant osteoarthritis or structural damage.  PATIENT SURVEYS:  LEFS  Extreme difficulty/unable (0), Quite a bit of difficulty (1), Moderate difficulty (2), Little difficulty (3), No difficulty (4) Survey date:  09/22/2024   Any of your usual work, housework or school activities 2  2. Usual hobbies, recreational or sporting activities 1  3. Getting into/out of the bath 3  4. Walking between rooms 4  5. Putting on socks/shoes 4  6. Squatting  2  7. Lifting an object, like a bag of groceries from the floor 4  8. Performing light activities around your home 4  9. Performing heavy activities around your home 4  10. Getting into/out of a car 2  11. Walking 2 blocks 2  12. Walking 1 mile 2  13. Going up/down 10 stairs (1 flight) 2  14. Standing for 1 hour 1  15.  sitting for 1 hour 1  16. Running on even ground 2  17. Running on uneven ground 1  18. Making sharp turns while running fast 0  19. Hopping  0  20. Rolling over in bed 3  Score total:  44/80     THE PATIENT SPECIFIC FUNCTIONAL SCALE  Place score of 0-10 (0 = unable to perform activity and 10 = able to perform activity at the same level as before injury or problem)  Activity Date: 09/22/24    Long Distance Driving (~2 hours) 5     2.     3.     4.      Total Score 5      Total Score = Sum of activity scores/number of activities  Minimally Detectable Change: 3 points (for single activity); 2 points (for average score)  [facility] (nd). The Patient Specific Functional Scale . Retrieved from [email or web address]   COGNITION: Overall cognitive status: Within functional limits for tasks assessed     SENSATION: Not tested  LOWER EXTREMITY MMT:  MMT Right eval Left eval  Hip flexion 4- 4  Hip extension 3 3+  Hip abduction 3 4-  Hip adduction    Hip internal rotation 4- 4  Hip external rotation 3+ 4-  Knee flexion 4 4  Knee extension 3+ 4-  Ankle dorsiflexion 4 4  Ankle  plantarflexion    Ankle inversion    Ankle eversion     (Blank rows = not tested)   LOWER EXTREMITY SPECIAL TESTS:  Hip special tests: deferred  FUNCTIONAL TESTS:  Deferred    Balance  09/22/24  Romberg EO on floor  30s  Romberg EC on floor 30s, mod sway  Romberg EO on airex   Romberg EC on airex     Balance Right 09/22/24 Left 09/22/24   Tandem on floor 20s 20s  SLS on floor      DGI Mild impairment: walks 20', uses assistive devices, slower speed, mild gait deviations. (2) Normal: Able to smoothly change walking speed without loss of balance or gait deviation. Shows significant difference in walking speeds between normal, fast and slow paces. (3) Moderate impairment: Performs head turns with moderate change in gait velocity, slows down, staggers, but recovers, can continue to walk. (1) Moderate impairment: Performs head turns with moderate change in gait velocity, slows down, staggers, but recovers, can continue to walk. (1) Mild impairment: pivot turns safely in >3 seconds and stops with no loss of balance. (2) Mild impairment: Is able to step over shoe box, but must slow down and adjust steps to clear box safely. (2) Normal: Is able to walk safely  around cones safely without changing gait speed; no evidence of imbalance. (3) Moderate impairment: Two feet to a stair, must use rail. (1) Total Score: 15/24 Scores below 19 suggest that the subject assessed has a higher risk of prospective falls                                                                                                                                 TREATMENT DATE:  [facility] Adult PT Treatment:                                                DATE: 10/12/24 Therapeutic Exercise: Bike level 3 x 5 mins Standing 3 way hip x10 BIL Standing heel raises x20 Neuromuscular re-ed: Tandem stance BIL 2x30 SLS BIL x30  Cone taps x3 SLS 2x30 BIL  Therapeutic Activity: Step-ups anterior/lateral 4 BIL 2x10 Side stepping airex balance beam with heel hang x4 laps Side stepping airex balance beam with toe hang x4 laps Fwd walking airex balance beam with 2 hurdles x4 laps   [facility] Adult PT Treatment:                                                DATE: 10/07/24 Therapeutic Exercise: Bike level 3 x 5 mins Standing 3 way hip x10 BIL Standing heel/toe raises 2x10 Neuromuscular re-ed: Tandem stance BIL 2x30 SLS BIL x30 (UE  support PRN) Cone taps x3 SLS 2x30 BIL  Therapeutic Activity: Step-ups anterior/lateral 4 BIL 2x10 Side stepping airex balance beam with heel hang x4 laps Side stepping airex balance beam with toe hang x4 laps  [facility] Adult PT Treatment:                                                DATE: 09/30/2024  Therapeutic Exercise: Bike level 2 x 5 mins Standing hip abduction/extension 2x10  Neuromuscular re-ed: Tandem stance BIL 2x30 SLS BIL x30 (UE support PRN) Toe Taps to 6 step   Therapeutic Activity: Step-ups anterior/lateral 4 BIL 2x10 Side stepping with heel hang Side stepping with toe hang    PATIENT EDUCATION:  Education details: reviewed initial home exercise program; discussion of POC, prognosis and goals for skilled PT   Person  educated: Patient Education method: Explanation, Demonstration, and Handouts Education comprehension: verbalized understanding, returned demonstration, and needs further education  HOME EXERCISE PROGRAM: Access Code: [email or web address] URL: [email or web address] Date: 09/22/2024 Prepared by: Marko Molt  Exercises - Clamshell  - 1 x daily - 4 x weekly - 2 sets - 10 reps - Supine Active Straight Leg Raise  - 1 x daily - 4 x weekly - 2 sets - 10 reps - Supine Bridge  - 1 x daily - 4 x weekly - 2 sets - 10 reps - Supine Hip Adduction Isometric with Ball  - 1 x daily - 4 x weekly - 2 sets - 10 reps - 3 sec hold - Seated Long Arc Quad  - 1 x daily - 4 x weekly - 2 sets - 10 reps - 3 sec hold - Heel Toe Raises with Counter Support  - 1 x daily - 4 x weekly - 2 sets - 10 reps  ASSESSMENT:  CLINICAL IMPRESSION: Patient presented to PT reporting soreness from last session. Today's session focuses on balance activities and LE strengthening. Patient demonstrated needing less upper extremity support while performing balance activities. Patient tolerated all exercises well, with increased challenge. Patient reported some increase in pain and soreness in her R hip at the end of session. Patient will benefit from skilled PT in order to increase functional abilities and return to prior level of function.   Eval: Aeris is a 48 y.o. female who was seen today for physical therapy evaluation and treatment for persisted R hip pain with strength deficits. She has decreased MMT scores BIL. She is demonstrating diminished static standing balance with narrow BOS. Dynamic balance to be further assessed and f/u visit. She has related pain and difficulty with standing, walking, driving, and sitting with legs crossed. She requires skilled PT services at this time to address relevant deficits and improve overall function.     OBJECTIVE IMPAIRMENTS: decreased activity tolerance, decreased balance, decreased  endurance, decreased strength, and pain.   ACTIVITY LIMITATIONS: carrying, sitting, standing, stairs, and locomotion level  PARTICIPATION LIMITATIONS: cleaning, interpersonal relationship, driving, shopping, community activity, and occupation  PERSONAL FACTORS: Time since onset of injury/illness/exacerbation and 3+ comorbidities: Relevant PMHx includes fibromyalgia, scoliosis, peripheral neuropathy, BIL knee pain, vitamin D  deficiency are also affecting patient's functional outcome.   REHAB POTENTIAL: Fair    CLINICAL DECISION MAKING: Evolving/moderate complexity  EVALUATION COMPLEXITY: Moderate   GOALS: Goals reviewed with patient? YES  SHORT TERM GOALS: Target date: 10/20/2024   Patient will be independent with  initial home program at least 3 days/week.  Baseline: provided at eval Goal Status: INITIAL   2.  Patient will demonstrate improved BIL LE MMT scores to at least 4+/5.  Baseline:  Goal Status: INITIAL     LONG TERM GOALS: Target date: 11/17/2024   Patient will report improved overall functional ability with LEFS score of 60/80 or greater.  Baseline: 44/80 Goal Status: INITIAL    2.  Patient will report 7/10 or greater PSFS score for long distance driving.  Baseline: 5/10 Goal status: INITIAL  3.  Patient will have 20/24 on DGI assessment in order to demonstrate decreased fall risk.  Baseline: to be assessed at f/u visit Goal status: INITIAL      PLAN:  PT FREQUENCY: 1-2x/week  PT DURATION: 8 weeks  PLANNED INTERVENTIONS: 97164- PT Re-evaluation, 97750- Physical Performance Testing, 97110-Therapeutic exercises, 97530- Therapeutic activity, 97112- Neuromuscular re-education, 97535- Self Care, [id number]- Manual therapy, [phone]- Aquatic Therapy, [id number]- Electrical stimulation (unattended), [phone]- Electrical stimulation (manual), 20560 (1-2 muscles), 20561 (3+ muscles)- Dry Needling, Patient/Family education, Balance training, Taping, Joint mobilization,  Cryotherapy, and Moist heat  PLAN FOR NEXT SESSION: DGI assessment; address LQ strength, pain modulation with manual therapy, modalities and low-impact aerobic activity    Shanda Code, SPTA 10/12/2024 12:15 PM

## 2024-10-14 ENCOUNTER — Ambulatory Visit

## 2024-10-14 DIAGNOSIS — M25551 Pain in right hip: Secondary | ICD-10-CM | POA: Diagnosis not present

## 2024-10-14 NOTE — Therapy (Addendum)
 OUTPATIENT PHYSICAL THERAPY TREATMENT   Patient Name: FREDERICKA BOTTCHER MRN: [id number] DOB:23-Jan-1976, 48 y.o., female Today's Date: 10/14/2024  END OF SESSION:  PT End of Session - 10/14/24 0950     Visit Number 6    Number of Visits 16    Date for Recertification  11/17/24    Authorization Type BC[employer]    PT Start Time [phone]    PT Stop Time 1035    PT Time Calculation (min) 40 min    Activity Tolerance Patient tolerated treatment well    Behavior During Therapy [facility] for tasks assessed/performed          Past Medical History:  Diagnosis Date   Carpal tunnel syndrome    Fibromyalgia    GERD (gastroesophageal reflux disease)    Hernia, hiatal    Hypercalcemia 01/06/2021   Left breast mass 01/26/2021   Liver cyst    Malaise 01/06/2021   Need for tetanus, diphtheria, and acellular pertussis (Tdap) vaccine 01/06/2021   Palpitations 01/26/2021   Polyuria 01/06/2021   Scoliosis    Weight loss 01/02/2021   Past Surgical History:  Procedure Laterality Date   CARPAL TUNNEL RELEASE Right 07/01/2023   Procedure: right carpal tunnel release;  Surgeon: Murrell Drivers, MD;  Location: [facility];  Service: Orthopedics;  Laterality: Right;   CARPAL TUNNEL RELEASE Left 11/11/2023   Procedure: LEFT CARPAL TUNNEL RELEASE;  Surgeon: Murrell Drivers, MD;  Location: [facility];  Service: Orthopedics;  Laterality: Left;  30 MIN   CERVICAL SPINE SURGERY     Left fooscrew and plate [id number]      TUBAL LIGATION     Patient Active Problem List   Diagnosis Date Noted   Peripheral neuropathy 03/12/2024   Bilateral knee pain 08/23/2023   Flank pain 03/18/2023   Family history of elevated blood lipids 03/18/2023   Screening for colon cancer 09/10/2022   Gastroesophageal reflux disease without esophagitis 09/10/2022   Needs flu shot 09/10/2022   Vitamin D  deficiency 08/13/2022   Pain in right hip 08/13/2022   Facial rash 02/08/2022   Fibromyalgia 11/09/2021   Fibromyalgia  syndrome 11/09/2021   Positive ANA (antinuclear antibody) 10/12/2021   Impingement syndrome of right shoulder 07/07/2021   Labral tear of shoulder, degenerative, right 07/07/2021   Carpal tunnel syndrome, bilateral 07/07/2021   DDD (degenerative disc disease), cervical 07/07/2021   Other fatigue 05/08/2021   Leg cramps 05/08/2021   Frequency of urination 05/08/2021   Irregular menses 05/08/2021   Scoliosis    Palpitations 01/26/2021   Left breast mass 01/26/2021   Polyuria 01/06/2021   Hypercalcemia 01/06/2021   Malaise 01/06/2021   Need for tetanus, diphtheria, and acellular pertussis (Tdap) vaccine 01/06/2021   Weight loss 01/02/2021   Hyperglycemia 01/02/2021    PCP: Nicholaus Credit, PA-C  REFERRING PROVIDER:  Jeannetta Lonni ORN, MD     REFERRING DIAG:  M77.9 (ICD-10-CM) - Tendinitis  M70.71 (ICD-10-CM) - Bursitis of right hip, unspecified bursa  M25.551 (ICD-10-CM) - Pain in right hip    THERAPY DIAG:  Pain in right hip  Rationale for Evaluation and Treatment: Rehabilitation  ONSET DATE: >6 months   SUBJECTIVE:   SUBJECTIVE STATEMENT: Patient states that her hip was very sore after her last session. She also states that her knees are feeling stiff today as well.  Eval: Patient reports to PT d/t R hip pain that has been present for >6 months. She does not recall any hip related injuries. She notes  that she has started to notice her R foot rolling in (pronation) with any standing. She states that this has already been happening on her L foot for years. She has history of knee pain. Her understanding is that she has some rotation and bone spurs on R side.     PERTINENT HISTORY: Relevant PMHx includes fibromyalgia, scoliosis, peripheral neuropathy, BIL knee pain, vitamin D  deficiency  PAIN:  Are you having pain? Yes: NPRS scale: 3-4/10 current, 5/10 worst  Pain location: R hip and thigh  Pain description: sore Aggravating factors: prolonged standing, driving,  sitting with legs crossed on ground Relieving factors: changing positions  PRECAUTIONS: None  RED FLAGS: None   WEIGHT BEARING RESTRICTIONS: No  FALLS:  Has patient fallen in last 6 months? Yes. Number of falls >3  LIVING ENVIRONMENT: Lives with: lives with their family Lives in: House/apartment Stairs: No Has following equipment at home: None  OCCUPATION: unemployed  PLOF: Independent  PATIENT GOALS: to have less pain in R hip with daily activities including standing, walking, prolonged sitting   NEXT MD VISIT: 10/01/2024  OBJECTIVE:  Note: Objective measures were completed at Evaluation unless otherwise noted.  DIAGNOSTIC FINDINGS:   Per Dr. Jeannetta, Hip xray looked good with no significant osteoarthritis or structural damage.  PATIENT SURVEYS:  LEFS  Extreme difficulty/unable (0), Quite a bit of difficulty (1), Moderate difficulty (2), Little difficulty (3), No difficulty (4) Survey date:  09/22/2024   Any of your usual work, housework or school activities 2  2. Usual hobbies, recreational or sporting activities 1  3. Getting into/out of the bath 3  4. Walking between rooms 4  5. Putting on socks/shoes 4  6. Squatting  2  7. Lifting an object, like a bag of groceries from the floor 4  8. Performing light activities around your home 4  9. Performing heavy activities around your home 4  10. Getting into/out of a car 2  11. Walking 2 blocks 2  12. Walking 1 mile 2  13. Going up/down 10 stairs (1 flight) 2  14. Standing for 1 hour 1  15.  sitting for 1 hour 1  16. Running on even ground 2  17. Running on uneven ground 1  18. Making sharp turns while running fast 0  19. Hopping  0  20. Rolling over in bed 3  Score total:  44/80     THE PATIENT SPECIFIC FUNCTIONAL SCALE  Place score of 0-10 (0 = unable to perform activity and 10 = able to perform activity at the same level as before injury or problem)  Activity Date: 09/22/24    Long Distance Driving (~2  hours) 5    2.     3.     4.      Total Score 5      Total Score = Sum of activity scores/number of activities  Minimally Detectable Change: 3 points (for single activity); 2 points (for average score)  [facility] (nd). The Patient Specific Functional Scale . Retrieved from [email or web address]   COGNITION: Overall cognitive status: Within functional limits for tasks assessed     SENSATION: Not tested  LOWER EXTREMITY MMT:  MMT Right eval Left eval  Hip flexion 4- 4  Hip extension 3 3+  Hip abduction 3 4-  Hip adduction    Hip internal rotation 4- 4  Hip external rotation 3+ 4-  Knee flexion 4 4  Knee extension 3+ 4-  Ankle dorsiflexion 4 4  Ankle plantarflexion    Ankle inversion    Ankle eversion     (Blank rows = not tested)   LOWER EXTREMITY SPECIAL TESTS:  Hip special tests: deferred  FUNCTIONAL TESTS:  Deferred    Balance  09/22/24  Romberg EO on floor  30s  Romberg EC on floor 30s, mod sway  Romberg EO on airex   Romberg EC on airex     Balance Right 09/22/24 Left 09/22/24   Tandem on floor 20s 20s  SLS on floor      DGI Mild impairment: walks 20', uses assistive devices, slower speed, mild gait deviations. (2) Normal: Able to smoothly change walking speed without loss of balance or gait deviation. Shows significant difference in walking speeds between normal, fast and slow paces. (3) Moderate impairment: Performs head turns with moderate change in gait velocity, slows down, staggers, but recovers, can continue to walk. (1) Moderate impairment: Performs head turns with moderate change in gait velocity, slows down, staggers, but recovers, can continue to walk. (1) Mild impairment: pivot turns safely in >3 seconds and stops with no loss of balance. (2) Mild impairment: Is able to step over shoe box, but must slow down and adjust steps to clear box safely. (2) Normal: Is able to  walk safely around cones safely without changing gait speed; no evidence of imbalance. (3) Moderate impairment: Two feet to a stair, must use rail. (1) Total Score: 15/24 Scores below 19 suggest that the subject assessed has a higher risk of prospective falls                                                                                                                                 TREATMENT DATE:  [facility] Adult PT Treatment:                                                DATE: 10/14/24 Therapeutic Exercise: Bike level 3 x 5 mins Standing 3 way hip x10 BIL Standing heel raises x20 Neuromuscular re-ed: Tandem stance airex pad BIL x30 (UE support PRN) SLS BIL 2x30  Cone taps x3 SLS 2x30 BIL  Therapeutic Activity: Step-ups fwd/lat 6 BIL 2x10 Side stepping airex balance beam with heel hang x4 laps Side stepping airex balance beam with toe hang x4 laps Fwd walking airex balance beam with 2 hurdles x4 laps  [facility] Adult PT Treatment:                                                DATE: 10/12/24 Therapeutic Exercise: Bike level 3 x 5 mins Standing 3 way hip x10 BIL Standing heel raises x20 Neuromuscular re-ed: Tandem stance BIL  2x30 SLS BIL x30  Cone taps x3 SLS 2x30 BIL  Therapeutic Activity: Step-ups anterior/lateral 4 BIL 2x10 Side stepping airex balance beam x2 hurdles with heel hang x4 laps Side stepping airex balance beam x2 hurdles with toe hang x4 laps Fwd walking airex balance beam with 2 hurdles x4 laps   [facility] Adult PT Treatment:                                                DATE: 10/07/24 Therapeutic Exercise: Bike level 3 x 5 mins Standing 3 way hip x10 BIL Standing heel/toe raises 2x10 Neuromuscular re-ed: Tandem stance BIL 2x30 SLS BIL x30 (UE support PRN) Cone taps x3 SLS 2x30 BIL  Therapeutic Activity: Step-ups anterior/lateral 4 BIL 2x10 Side stepping airex balance beam with heel hang x4 laps Side stepping airex balance beam with toe hang x4  laps    PATIENT EDUCATION:  Education details: reviewed initial home exercise program; discussion of POC, prognosis and goals for skilled PT   Person educated: Patient Education method: Explanation, Demonstration, and Handouts Education comprehension: verbalized understanding, returned demonstration, and needs further education  HOME EXERCISE PROGRAM: Access Code: [email or web address] URL: [email or web address] Date: 09/22/2024 Prepared by: Marko Molt  Exercises - Clamshell  - 1 x daily - 4 x weekly - 2 sets - 10 reps - Supine Active Straight Leg Raise  - 1 x daily - 4 x weekly - 2 sets - 10 reps - Supine Bridge  - 1 x daily - 4 x weekly - 2 sets - 10 reps - Supine Hip Adduction Isometric with Ball  - 1 x daily - 4 x weekly - 2 sets - 10 reps - 3 sec hold - Seated Long Arc Quad  - 1 x daily - 4 x weekly - 2 sets - 10 reps - 3 sec hold - Heel Toe Raises with Counter Support  - 1 x daily - 4 x weekly - 2 sets - 10 reps  ASSESSMENT:  CLINICAL IMPRESSION: Patient presents to PT reporting more hip soreness from last session, as well as her knees being sore. Today's session focused on increasing balance activities, patient had more difficulty with right LE with increased UE support during exercises. Patient tolerated all exercise well. Next session plan to focus on strengthening and stretching of RLE. Patient will benefit from continued PT to increase functional independence    Eval: Brytnee is a 48 y.o. female who was seen today for physical therapy evaluation and treatment for persisted R hip pain with strength deficits. She has decreased MMT scores BIL. She is demonstrating diminished static standing balance with narrow BOS. Dynamic balance to be further assessed and f/u visit. She has related pain and difficulty with standing, walking, driving, and sitting with legs crossed. She requires skilled PT services at this time to address relevant deficits and improve overall function.      OBJECTIVE IMPAIRMENTS: decreased activity tolerance, decreased balance, decreased endurance, decreased strength, and pain.   ACTIVITY LIMITATIONS: carrying, sitting, standing, stairs, and locomotion level  PARTICIPATION LIMITATIONS: cleaning, interpersonal relationship, driving, shopping, community activity, and occupation  PERSONAL FACTORS: Time since onset of injury/illness/exacerbation and 3+ comorbidities: Relevant PMHx includes fibromyalgia, scoliosis, peripheral neuropathy, BIL knee pain, vitamin D  deficiency are also affecting patient's functional outcome.   REHAB POTENTIAL: Fair    CLINICAL DECISION MAKING: Evolving/moderate complexity  EVALUATION  COMPLEXITY: Moderate   GOALS: Goals reviewed with patient? YES  SHORT TERM GOALS: Target date: 10/20/2024   Patient will be independent with initial home program at least 3 days/week.  Baseline: provided at eval Goal Status: INITIAL   2.  Patient will demonstrate improved BIL LE MMT scores to at least 4+/5.  Baseline:  Goal Status: INITIAL     LONG TERM GOALS: Target date: 11/17/2024   Patient will report improved overall functional ability with LEFS score of 60/80 or greater.  Baseline: 44/80 Goal Status: INITIAL    2.  Patient will report 7/10 or greater PSFS score for long distance driving.  Baseline: 5/10 Goal status: INITIAL  3.  Patient will have 20/24 on DGI assessment in order to demonstrate decreased fall risk.  Baseline: to be assessed at f/u visit Goal status: INITIAL      PLAN:  PT FREQUENCY: 1-2x/week  PT DURATION: 8 weeks  PLANNED INTERVENTIONS: 97164- PT Re-evaluation, 97750- Physical Performance Testing, 97110-Therapeutic exercises, 97530- Therapeutic activity, 97112- Neuromuscular re-education, 97535- Self Care, [id number]- Manual therapy, [phone]- Aquatic Therapy, [id number]- Electrical stimulation (unattended), [phone]- Electrical stimulation (manual), 20560 (1-2 muscles), 20561 (3+ muscles)- Dry  Needling, Patient/Family education, Balance training, Taping, Joint mobilization, Cryotherapy, and Moist heat  PLAN FOR NEXT SESSION: DGI assessment; address LQ strength, pain modulation with manual therapy, modalities and low-impact aerobic activity    Shanda Code, SPTA 10/14/2024 9:51 AM

## 2024-10-15 ENCOUNTER — Other Ambulatory Visit: Payer: Self-pay | Admitting: Physician Assistant

## 2024-10-16 ENCOUNTER — Ambulatory Visit

## 2024-10-19 ENCOUNTER — Ambulatory Visit

## 2024-10-19 DIAGNOSIS — M25551 Pain in right hip: Secondary | ICD-10-CM | POA: Diagnosis not present

## 2024-10-19 NOTE — Therapy (Signed)
 OUTPATIENT PHYSICAL THERAPY TREATMENT   Patient Name: Sarah [name] MRN: [id number] DOB:08-20-1976, 48 y.o., female Today's Date: 10/19/2024  END OF SESSION:  PT End of Session - 10/19/24 1044     Visit Number 7    Number of Visits 16    Date for Recertification  11/17/24    Authorization Type BCBS    PT Start Time 1045    PT Stop Time 1125    PT Time Calculation (min) 40 min    Activity Tolerance Patient tolerated treatment well    Behavior During Therapy [facility] for tasks assessed/performed           Past Medical History:  Diagnosis Date   Carpal tunnel syndrome    Fibromyalgia    GERD (gastroesophageal reflux disease)    Hernia, hiatal    Hypercalcemia 01/06/2021   Left breast mass 01/26/2021   Liver cyst    Malaise 01/06/2021   Need for tetanus, diphtheria, and acellular pertussis (Tdap) vaccine 01/06/2021   Palpitations 01/26/2021   Polyuria 01/06/2021   Scoliosis    Weight loss 01/02/2021   Past Surgical History:  Procedure Laterality Date   CARPAL TUNNEL RELEASE Right 07/01/2023   Procedure: right carpal tunnel release;  Surgeon: Murrell Drivers, MD;  Location: [facility];  Service: Orthopedics;  Laterality: Right;   CARPAL TUNNEL RELEASE Left 11/11/2023   Procedure: LEFT CARPAL TUNNEL RELEASE;  Surgeon: Murrell Drivers, MD;  Location: [facility];  Service: Orthopedics;  Laterality: Left;  30 MIN   CERVICAL SPINE SURGERY     Left fooscrew and plate [id number]      TUBAL LIGATION     Patient Active Problem List   Diagnosis Date Noted   Peripheral neuropathy 03/12/2024   Bilateral knee pain 08/23/2023   Flank pain 03/18/2023   Family history of elevated blood lipids 03/18/2023   Screening for colon cancer 09/10/2022   Gastroesophageal reflux disease without esophagitis 09/10/2022   Needs flu shot 09/10/2022   Vitamin D  deficiency 08/13/2022   Pain in right hip 08/13/2022   Facial rash 02/08/2022   Fibromyalgia 11/09/2021   Fibromyalgia  syndrome 11/09/2021   Positive ANA (antinuclear antibody) 10/12/2021   Impingement syndrome of right shoulder 07/07/2021   Labral tear of shoulder, degenerative, right 07/07/2021   Carpal tunnel syndrome, bilateral 07/07/2021   DDD (degenerative disc disease), cervical 07/07/2021   Other fatigue 05/08/2021   Leg cramps 05/08/2021   Frequency of urination 05/08/2021   Irregular menses 05/08/2021   Scoliosis    Palpitations 01/26/2021   Left breast mass 01/26/2021   Polyuria 01/06/2021   Hypercalcemia 01/06/2021   Malaise 01/06/2021   Need for tetanus, diphtheria, and acellular pertussis (Tdap) vaccine 01/06/2021   Weight loss 01/02/2021   Hyperglycemia 01/02/2021    PCP: Nicholaus Credit, PA-C  REFERRING PROVIDER:  Jeannetta Lonni ORN, MD     REFERRING DIAG:  M77.9 (ICD-10-CM) - Tendinitis  M70.71 (ICD-10-CM) - Bursitis of right hip, unspecified bursa  M25.551 (ICD-10-CM) - Pain in right hip    THERAPY DIAG:  Pain in right hip  Rationale for Evaluation and Treatment: Rehabilitation  ONSET DATE: >6 months   SUBJECTIVE:   SUBJECTIVE STATEMENT: Patient states that she has been more sore than usual and it is causing her to have a difficult time sleeping.  Eval: Patient reports to PT d/t R hip pain that has been present for >6 months. She does not recall any hip related injuries. She notes that she  has started to notice her R foot rolling in (pronation) with any standing. She states that this has already been happening on her L foot for years. She has history of knee pain. Her understanding is that she has some rotation and bone spurs on R side.     PERTINENT HISTORY: Relevant PMHx includes fibromyalgia, scoliosis, peripheral neuropathy, BIL knee pain, vitamin D  deficiency  PAIN:  Are you having pain? Yes: NPRS scale: 3-4/10 current, 5/10 worst  Pain location: R hip and thigh  Pain description: sore Aggravating factors: prolonged standing, driving, sitting with legs  crossed on ground Relieving factors: changing positions  PRECAUTIONS: None  RED FLAGS: None   WEIGHT BEARING RESTRICTIONS: No  FALLS:  Has patient fallen in last 6 months? Yes. Number of falls >3  LIVING ENVIRONMENT: Lives with: lives with their family Lives in: House/apartment Stairs: No Has following equipment at home: None  OCCUPATION: unemployed  PLOF: Independent  PATIENT GOALS: to have less pain in R hip with daily activities including standing, walking, prolonged sitting   NEXT MD VISIT: 10/01/2024  OBJECTIVE:  Note: Objective measures were completed at Evaluation unless otherwise noted.  DIAGNOSTIC FINDINGS:   Per Dr. Jeannetta, Hip xray looked good with no significant osteoarthritis or structural damage.  PATIENT SURVEYS:  LEFS  Extreme difficulty/unable (0), Quite a bit of difficulty (1), Moderate difficulty (2), Little difficulty (3), No difficulty (4) Survey date:  09/22/2024   Any of your usual work, housework or school activities 2  2. Usual hobbies, recreational or sporting activities 1  3. Getting into/out of the bath 3  4. Walking between rooms 4  5. Putting on socks/shoes 4  6. Squatting  2  7. Lifting an object, like a bag of groceries from the floor 4  8. Performing light activities around your home 4  9. Performing heavy activities around your home 4  10. Getting into/out of a car 2  11. Walking 2 blocks 2  12. Walking 1 mile 2  13. Going up/down 10 stairs (1 flight) 2  14. Standing for 1 hour 1  15.  sitting for 1 hour 1  16. Running on even ground 2  17. Running on uneven ground 1  18. Making sharp turns while running fast 0  19. Hopping  0  20. Rolling over in bed 3  Score total:  44/80     THE PATIENT SPECIFIC FUNCTIONAL SCALE  Place score of 0-10 (0 = unable to perform activity and 10 = able to perform activity at the same level as before injury or problem)  Activity Date: 09/22/24    Long Distance Driving (~2 hours) 5    2.      3.     4.      Total Score 5      Total Score = Sum of activity scores/number of activities  Minimally Detectable Change: 3 points (for single activity); 2 points (for average score)  [facility] (nd). The Patient Specific Functional Scale . Retrieved from [email or web address]   COGNITION: Overall cognitive status: Within functional limits for tasks assessed     SENSATION: Not tested  LOWER EXTREMITY MMT:  MMT Right eval Left eval  Hip flexion 4- 4  Hip extension 3 3+  Hip abduction 3 4-  Hip adduction    Hip internal rotation 4- 4  Hip external rotation 3+ 4-  Knee flexion 4 4  Knee extension 3+ 4-  Ankle dorsiflexion 4 4  Ankle  plantarflexion    Ankle inversion    Ankle eversion     (Blank rows = not tested)   LOWER EXTREMITY SPECIAL TESTS:  Hip special tests: deferred  FUNCTIONAL TESTS:  Deferred    Balance  09/22/24  Romberg EO on floor  30s  Romberg EC on floor 30s, mod sway  Romberg EO on airex   Romberg EC on airex     Balance Right 09/22/24 Left 09/22/24   Tandem on floor 20s 20s  SLS on floor      DGI Mild impairment: walks 20', uses assistive devices, slower speed, mild gait deviations. (2) Normal: Able to smoothly change walking speed without loss of balance or gait deviation. Shows significant difference in walking speeds between normal, fast and slow paces. (3) Moderate impairment: Performs head turns with moderate change in gait velocity, slows down, staggers, but recovers, can continue to walk. (1) Moderate impairment: Performs head turns with moderate change in gait velocity, slows down, staggers, but recovers, can continue to walk. (1) Mild impairment: pivot turns safely in >3 seconds and stops with no loss of balance. (2) Mild impairment: Is able to step over shoe box, but must slow down and adjust steps to clear box safely. (2) Normal: Is able to walk safely around  cones safely without changing gait speed; no evidence of imbalance. (3) Moderate impairment: Two feet to a stair, must use rail. (1) Total Score: 15/24 Scores below 19 suggest that the subject assessed has a higher risk of prospective falls                                                                                                                                 TREATMENT DATE: [facility] Adult PT Treatment:                                                DATE: 10/19/24 Therapeutic Exercise: Bike level 3 x 5 mins Supine firgure 4 stretch with LTR 2x30 Modified thomas stretch 2x30 RLE  FAQ with ball 3x15 Neuromuscular re-ed: SLS BIL 2x30 Therapeutic Activity: Step ups fwd 8 BIL 2x10 Standing hip abduction/extension 2x10 BIL Standing heel raises 2x20  [facility] Adult PT Treatment:                                                DATE: 10/14/24 Therapeutic Exercise: Bike level 3 x 5 mins Standing 3 way hip x10 BIL Standing heel raises x20 Neuromuscular re-ed: Tandem stance airex pad BIL x30 (UE support PRN) SLS BIL 2x30  Cone taps x3 SLS 2x30 BIL  Therapeutic Activity: Step-ups fwd/lat 6 BIL 2x10 Side stepping airex balance beam with heel hang  x4 laps Side stepping airex balance beam with toe hang x4 laps Fwd walking airex balance beam with 2 hurdles x4 laps  [facility] Adult PT Treatment:                                                DATE: 10/12/24 Therapeutic Exercise: Bike level 3 x 5 mins Standing 3 way hip x10 BIL Standing heel raises x20 Neuromuscular re-ed: Tandem stance BIL 2x30 SLS BIL x30  Cone taps x3 SLS 2x30 BIL  Therapeutic Activity: Step-ups anterior/lateral 4 BIL 2x10 Side stepping airex balance beam x2 hurdles with heel hang x4 laps Side stepping airex balance beam x2 hurdles with toe hang x4 laps Fwd walking airex balance beam with 2 hurdles x4 laps   [facility] Adult PT Treatment:                                                DATE: 10/07/24 Therapeutic  Exercise: Bike level 3 x 5 mins Standing 3 way hip x10 BIL Standing heel/toe raises 2x10 Neuromuscular re-ed: Tandem stance BIL 2x30 SLS BIL x30 (UE support PRN) Cone taps x3 SLS 2x30 BIL  Therapeutic Activity: Step-ups anterior/lateral 4 BIL 2x10 Side stepping airex balance beam with heel hang x4 laps Side stepping airex balance beam with toe hang x4 laps    PATIENT EDUCATION:  Education details: reviewed initial home exercise program; discussion of POC, prognosis and goals for skilled PT   Person educated: Patient Education method: Explanation, Demonstration, and Handouts Education comprehension: verbalized understanding, returned demonstration, and needs further education  HOME EXERCISE PROGRAM: Access Code: [email or web address] URL: [email or web address] Date: 09/22/2024 Prepared by: Marko Molt  Exercises - Clamshell  - 1 x daily - 4 x weekly - 2 sets - 10 reps - Supine Active Straight Leg Raise  - 1 x daily - 4 x weekly - 2 sets - 10 reps - Supine Bridge  - 1 x daily - 4 x weekly - 2 sets - 10 reps - Supine Hip Adduction Isometric with Ball  - 1 x daily - 4 x weekly - 2 sets - 10 reps - 3 sec hold - Seated Long Arc Quad  - 1 x daily - 4 x weekly - 2 sets - 10 reps - 3 sec hold - Heel Toe Raises with Counter Support  - 1 x daily - 4 x weekly - 2 sets - 10 reps  ASSESSMENT:  CLINICAL IMPRESSION: Patient presents to PT reporting that she is still having some soreness in her hip, it has started to affect her sleeping. Due to patients reported soreness some exercises were decreased in resistance to not increase patients pain. Today's session focused on hip strengthening and stretching in increase hip mobility. Patient will benefit from continued PT to increase functional independence.    Eval: Talyn is a 48 y.o. female who was seen today for physical therapy evaluation and treatment for persisted R hip pain with strength deficits. She has decreased MMT scores BIL.  She is demonstrating diminished static standing balance with narrow BOS. Dynamic balance to be further assessed and f/u visit. She has related pain and difficulty with standing, walking, driving, and sitting with legs crossed. She requires skilled PT services at  this time to address relevant deficits and improve overall function.     OBJECTIVE IMPAIRMENTS: decreased activity tolerance, decreased balance, decreased endurance, decreased strength, and pain.   ACTIVITY LIMITATIONS: carrying, sitting, standing, stairs, and locomotion level  PARTICIPATION LIMITATIONS: cleaning, interpersonal relationship, driving, shopping, community activity, and occupation  PERSONAL FACTORS: Time since onset of injury/illness/exacerbation and 3+ comorbidities: Relevant PMHx includes fibromyalgia, scoliosis, peripheral neuropathy, BIL knee pain, vitamin D  deficiency are also affecting patient's functional outcome.   REHAB POTENTIAL: Fair    CLINICAL DECISION MAKING: Evolving/moderate complexity  EVALUATION COMPLEXITY: Moderate   GOALS: Goals reviewed with patient? YES  SHORT TERM GOALS: Target date: 10/20/2024   Patient will be independent with initial home program at least 3 days/week.  Baseline: provided at eval Goal Status: INITIAL   2.  Patient will demonstrate improved BIL LE MMT scores to at least 4+/5.  Baseline:  Goal Status: INITIAL     LONG TERM GOALS: Target date: 11/17/2024   Patient will report improved overall functional ability with LEFS score of 60/80 or greater.  Baseline: 44/80 Goal Status: INITIAL    2.  Patient will report 7/10 or greater PSFS score for long distance driving.  Baseline: 5/10 Goal status: INITIAL  3.  Patient will have 20/24 on DGI assessment in order to demonstrate decreased fall risk.  Baseline: to be assessed at f/u visit Goal status: INITIAL      PLAN:  PT FREQUENCY: 1-2x/week  PT DURATION: 8 weeks  PLANNED INTERVENTIONS: 97164- PT  Re-evaluation, 97750- Physical Performance Testing, 97110-Therapeutic exercises, 97530- Therapeutic activity, 97112- Neuromuscular re-education, 97535- Self Care, [id number]- Manual therapy, [phone]- Aquatic Therapy, [id number]- Electrical stimulation (unattended), [phone]- Electrical stimulation (manual), 20560 (1-2 muscles), 20561 (3+ muscles)- Dry Needling, Patient/Family education, Balance training, Taping, Joint mobilization, Cryotherapy, and Moist heat  PLAN FOR NEXT SESSION: DGI assessment; address LQ strength, pain modulation with manual therapy, modalities and low-impact aerobic activity    Shanda Code, SPTA 10/19/2024 10:45 AM

## 2024-10-21 ENCOUNTER — Ambulatory Visit

## 2024-10-21 DIAGNOSIS — M25551 Pain in right hip: Secondary | ICD-10-CM | POA: Diagnosis not present

## 2024-10-21 NOTE — Therapy (Signed)
 OUTPATIENT PHYSICAL THERAPY TREATMENT   Patient Name: Sarah [name] MRN: [id number] DOB:Jun 12, 1976, 48 y.o., female Today's Date: 10/21/2024  END OF SESSION:  PT End of Session - 10/21/24 0958     Visit Number 8    Number of Visits 16    Date for Recertification  11/17/24    Authorization Type BCBS    PT Start Time 1000    PT Stop Time 1040    PT Time Calculation (min) 40 min    Activity Tolerance Patient tolerated treatment well    Behavior During Therapy [facility] for tasks assessed/performed          Past Medical History:  Diagnosis Date   Carpal tunnel syndrome    Fibromyalgia    GERD (gastroesophageal reflux disease)    Hernia, hiatal    Hypercalcemia 01/06/2021   Left breast mass 01/26/2021   Liver cyst    Malaise 01/06/2021   Need for tetanus, diphtheria, and acellular pertussis (Tdap) vaccine 01/06/2021   Palpitations 01/26/2021   Polyuria 01/06/2021   Scoliosis    Weight loss 01/02/2021   Past Surgical History:  Procedure Laterality Date   CARPAL TUNNEL RELEASE Right 07/01/2023   Procedure: right carpal tunnel release;  Surgeon: Murrell Drivers, MD;  Location: [facility];  Service: Orthopedics;  Laterality: Right;   CARPAL TUNNEL RELEASE Left 11/11/2023   Procedure: LEFT CARPAL TUNNEL RELEASE;  Surgeon: Murrell Drivers, MD;  Location: [facility];  Service: Orthopedics;  Laterality: Left;  30 MIN   CERVICAL SPINE SURGERY     Left fooscrew and plate [id number]      TUBAL LIGATION     Patient Active Problem List   Diagnosis Date Noted   Peripheral neuropathy 03/12/2024   Bilateral knee pain 08/23/2023   Flank pain 03/18/2023   Family history of elevated blood lipids 03/18/2023   Screening for colon cancer 09/10/2022   Gastroesophageal reflux disease without esophagitis 09/10/2022   Needs flu shot 09/10/2022   Vitamin D  deficiency 08/13/2022   Pain in right hip 08/13/2022   Facial rash 02/08/2022   Fibromyalgia 11/09/2021   Fibromyalgia  syndrome 11/09/2021   Positive ANA (antinuclear antibody) 10/12/2021   Impingement syndrome of right shoulder 07/07/2021   Labral tear of shoulder, degenerative, right 07/07/2021   Carpal tunnel syndrome, bilateral 07/07/2021   DDD (degenerative disc disease), cervical 07/07/2021   Other fatigue 05/08/2021   Leg cramps 05/08/2021   Frequency of urination 05/08/2021   Irregular menses 05/08/2021   Scoliosis    Palpitations 01/26/2021   Left breast mass 01/26/2021   Polyuria 01/06/2021   Hypercalcemia 01/06/2021   Malaise 01/06/2021   Need for tetanus, diphtheria, and acellular pertussis (Tdap) vaccine 01/06/2021   Weight loss 01/02/2021   Hyperglycemia 01/02/2021    PCP: Nicholaus Credit, PA-C  REFERRING PROVIDER:  Jeannetta Lonni ORN, MD     REFERRING DIAG:  M77.9 (ICD-10-CM) - Tendinitis  M70.71 (ICD-10-CM) - Bursitis of right hip, unspecified bursa  M25.551 (ICD-10-CM) - Pain in right hip    THERAPY DIAG:  Pain in right hip  Rationale for Evaluation and Treatment: Rehabilitation  ONSET DATE: >6 months   SUBJECTIVE:   SUBJECTIVE STATEMENT: Patient states that she feels a bit better than last time. The soreness isn't as bad today.  Eval: Patient reports to PT d/t R hip pain that has been present for >6 months. She does not recall any hip related injuries. She notes that she has started to notice her  R foot rolling in (pronation) with any standing. She states that this has already been happening on her L foot for years. She has history of knee pain. Her understanding is that she has some rotation and bone spurs on R side.     PERTINENT HISTORY: Relevant PMHx includes fibromyalgia, scoliosis, peripheral neuropathy, BIL knee pain, vitamin D  deficiency  PAIN:  Are you having pain? Yes: NPRS scale: 3-4/10 current, 5/10 worst  Pain location: R hip and thigh  Pain description: sore Aggravating factors: prolonged standing, driving, sitting with legs crossed on  ground Relieving factors: changing positions  PRECAUTIONS: None  RED FLAGS: None   WEIGHT BEARING RESTRICTIONS: No  FALLS:  Has patient fallen in last 6 months? Yes. Number of falls >3  LIVING ENVIRONMENT: Lives with: lives with their family Lives in: House/apartment Stairs: No Has following equipment at home: None  OCCUPATION: unemployed  PLOF: Independent  PATIENT GOALS: to have less pain in R hip with daily activities including standing, walking, prolonged sitting   NEXT MD VISIT: 10/01/2024  OBJECTIVE:  Note: Objective measures were completed at Evaluation unless otherwise noted.  DIAGNOSTIC FINDINGS:   Per Dr. Jeannetta, Hip xray looked good with no significant osteoarthritis or structural damage.  PATIENT SURVEYS:  LEFS  Extreme difficulty/unable (0), Quite a bit of difficulty (1), Moderate difficulty (2), Little difficulty (3), No difficulty (4) Survey date:  09/22/2024   Any of your usual work, housework or school activities 2  2. Usual hobbies, recreational or sporting activities 1  3. Getting into/out of the bath 3  4. Walking between rooms 4  5. Putting on socks/shoes 4  6. Squatting  2  7. Lifting an object, like a bag of groceries from the floor 4  8. Performing light activities around your home 4  9. Performing heavy activities around your home 4  10. Getting into/out of a car 2  11. Walking 2 blocks 2  12. Walking 1 mile 2  13. Going up/down 10 stairs (1 flight) 2  14. Standing for 1 hour 1  15.  sitting for 1 hour 1  16. Running on even ground 2  17. Running on uneven ground 1  18. Making sharp turns while running fast 0  19. Hopping  0  20. Rolling over in bed 3  Score total:  44/80     THE PATIENT SPECIFIC FUNCTIONAL SCALE  Place score of 0-10 (0 = unable to perform activity and 10 = able to perform activity at the same level as before injury or problem)  Activity Date: 09/22/24    Long Distance Driving (~2 hours) 5    2.     3.      4.      Total Score 5      Total Score = Sum of activity scores/number of activities  Minimally Detectable Change: 3 points (for single activity); 2 points (for average score)  [facility] (nd). The Patient Specific Functional Scale . Retrieved from [email or web address]   COGNITION: Overall cognitive status: Within functional limits for tasks assessed     SENSATION: Not tested  LOWER EXTREMITY MMT:  MMT Right eval Left eval  Hip flexion 4- 4  Hip extension 3 3+  Hip abduction 3 4-  Hip adduction    Hip internal rotation 4- 4  Hip external rotation 3+ 4-  Knee flexion 4 4  Knee extension 3+ 4-  Ankle dorsiflexion 4 4  Ankle plantarflexion    Ankle  inversion    Ankle eversion     (Blank rows = not tested)   LOWER EXTREMITY SPECIAL TESTS:  Hip special tests: deferred  FUNCTIONAL TESTS:  Deferred    Balance  09/22/24  Romberg EO on floor  30s  Romberg EC on floor 30s, mod sway  Romberg EO on airex   Romberg EC on airex     Balance Right 09/22/24 Left 09/22/24   Tandem on floor 20s 20s  SLS on floor      DGI Mild impairment: walks 20', uses assistive devices, slower speed, mild gait deviations. (2) Normal: Able to smoothly change walking speed without loss of balance or gait deviation. Shows significant difference in walking speeds between normal, fast and slow paces. (3) Moderate impairment: Performs head turns with moderate change in gait velocity, slows down, staggers, but recovers, can continue to walk. (1) Moderate impairment: Performs head turns with moderate change in gait velocity, slows down, staggers, but recovers, can continue to walk. (1) Mild impairment: pivot turns safely in >3 seconds and stops with no loss of balance. (2) Mild impairment: Is able to step over shoe box, but must slow down and adjust steps to clear box safely. (2) Normal: Is able to walk safely around cones  safely without changing gait speed; no evidence of imbalance. (3) Moderate impairment: Two feet to a stair, must use rail. (1) Total Score: 15/24 Scores below 19 suggest that the subject assessed has a higher risk of prospective falls                                                                                                                                 TREATMENT DATE: [facility] Adult PT Treatment:                                                DATE: 10/21/24 Therapeutic Exercise: Bike level 3 x 8 mins Standing hip abduction/extension 2x10 BIL Standing heel raises 2x20 Supine figure 4 stretch with LTR 2x30 Modified thomas stretch 2x30 RLE SLR RLE x10 Neuromuscular re-ed: Tandem stance airex pad BIL x30  SLS BIL 2x30  Therapeutic Activity: Step-ups fwd/lat 8 BIL 2x10  [facility] Adult PT Treatment:                                                DATE: 10/19/24 Therapeutic Exercise: Bike level 3 x 5 mins Supine firgure 4 stretch with LTR 2x30 Modified thomas stretch 2x30 RLE  FAQ with ball 3x15 Neuromuscular re-ed: SLS BIL 2x30 Therapeutic Activity: Step ups fwd 8 BIL 2x10 Standing hip abduction/extension 2x10 BIL Standing heel raises 2x20  [facility] Adult PT Treatment:  DATE: 10/14/24 Therapeutic Exercise: Bike level 3 x 5 mins Standing 3 way hip x10 BIL Standing heel raises x20 Neuromuscular re-ed: Tandem stance airex pad BIL x30 (UE support PRN) SLS BIL 2x30  Cone taps x3 SLS 2x30 BIL  Therapeutic Activity: Step-ups fwd/lat 6 BIL 2x10 Side stepping airex balance beam with heel hang x4 laps Side stepping airex balance beam with toe hang x4 laps Fwd walking airex balance beam with 2 hurdles x4 laps   PATIENT EDUCATION:  Education details: reviewed initial home exercise program; discussion of POC, prognosis and goals for skilled PT   Person educated: Patient Education method: Explanation, Demonstration, and  Handouts Education comprehension: verbalized understanding, returned demonstration, and needs further education  HOME EXERCISE PROGRAM: Access Code: [email or web address] URL: [email or web address] Date: 09/22/2024 Prepared by: Marko Molt  Exercises - Clamshell  - 1 x daily - 4 x weekly - 2 sets - 10 reps - Supine Active Straight Leg Raise  - 1 x daily - 4 x weekly - 2 sets - 10 reps - Supine Bridge  - 1 x daily - 4 x weekly - 2 sets - 10 reps - Supine Hip Adduction Isometric with Ball  - 1 x daily - 4 x weekly - 2 sets - 10 reps - 3 sec hold - Seated Long Arc Quad  - 1 x daily - 4 x weekly - 2 sets - 10 reps - 3 sec hold - Heel Toe Raises with Counter Support  - 1 x daily - 4 x weekly - 2 sets - 10 reps  ASSESSMENT:  CLINICAL IMPRESSION: Patient presents to PT reporting that she is feeling better today than she was last time. Her hip isn't as sore today. Today's session focused on balance exercises and hip strengthening. Patient is demonstrating improved balance, needing less UE support. Patient tolerated exercises well, with no increase in hip pain. Patient will benefit from skilled PT in order to increase functional independence and hip strength and mobility.   Eval: Sarah [name] is a 47 y.o. female who was seen today for physical therapy evaluation and treatment for persisted R hip pain with strength deficits. She has decreased MMT scores BIL. She is demonstrating diminished static standing balance with narrow BOS. Dynamic balance to be further assessed and f/u visit. She has related pain and difficulty with standing, walking, driving, and sitting with legs crossed. She requires skilled PT services at this time to address relevant deficits and improve overall function.     OBJECTIVE IMPAIRMENTS: decreased activity tolerance, decreased balance, decreased endurance, decreased strength, and pain.   ACTIVITY LIMITATIONS: carrying, sitting, standing, stairs, and locomotion  level  PARTICIPATION LIMITATIONS: cleaning, interpersonal relationship, driving, shopping, community activity, and occupation  PERSONAL FACTORS: Time since onset of injury/illness/exacerbation and 3+ comorbidities: Relevant PMHx includes fibromyalgia, scoliosis, peripheral neuropathy, BIL knee pain, vitamin D  deficiency are also affecting patient's functional outcome.   REHAB POTENTIAL: Fair    CLINICAL DECISION MAKING: Evolving/moderate complexity  EVALUATION COMPLEXITY: Moderate   GOALS: Goals reviewed with patient? YES  SHORT TERM GOALS: Target date: 10/20/2024   Patient will be independent with initial home program at least 3 days/week.  Baseline: provided at eval Goal Status: INITIAL   2.  Patient will demonstrate improved BIL LE MMT scores to at least 4+/5.  Baseline:  Goal Status: INITIAL     LONG TERM GOALS: Target date: 11/17/2024   Patient will report improved overall functional ability with LEFS score of 60/80 or greater.  Baseline: 44/80 Goal Status:  INITIAL    2.  Patient will report 7/10 or greater PSFS score for long distance driving.  Baseline: 5/10 Goal status: INITIAL  3.  Patient will have 20/24 on DGI assessment in order to demonstrate decreased fall risk.  Baseline: to be assessed at f/u visit Goal status: INITIAL      PLAN:  PT FREQUENCY: 1-2x/week  PT DURATION: 8 weeks  PLANNED INTERVENTIONS: 97164- PT Re-evaluation, 97750- Physical Performance Testing, 97110-Therapeutic exercises, 97530- Therapeutic activity, 97112- Neuromuscular re-education, 97535- Self Care, [id number]- Manual therapy, [phone]- Aquatic Therapy, [id number]- Electrical stimulation (unattended), [phone]- Electrical stimulation (manual), 20560 (1-2 muscles), 20561 (3+ muscles)- Dry Needling, Patient/Family education, Balance training, Taping, Joint mobilization, Cryotherapy, and Moist heat  PLAN FOR NEXT SESSION: DGI assessment; address LQ strength, pain modulation with manual  therapy, modalities and low-impact aerobic activity    Shanda Code, SPTA 10/21/2024 10:46 AM

## 2024-10-27 ENCOUNTER — Ambulatory Visit

## 2024-10-27 DIAGNOSIS — M25551 Pain in right hip: Secondary | ICD-10-CM | POA: Diagnosis present

## 2024-10-27 NOTE — Therapy (Signed)
 OUTPATIENT PHYSICAL THERAPY TREATMENT   Patient Name: Sarah [name] MRN: [id number] DOB:[birth year], 48 y.o., female Today's Date: 10/27/2024  END OF SESSION:  PT End of Session - 10/27/24 1353     Visit Number 9    Number of Visits 16    Date for Recertification  11/17/24    Authorization Type BC[employer]    PT Start Time 1400    PT Stop Time 1438    PT Time Calculation (min) 38 min    Activity Tolerance Patient tolerated treatment well    Behavior During Therapy [facility] for tasks assessed/performed          Past Medical History:  Diagnosis Date   Carpal tunnel syndrome    Fibromyalgia    GERD (gastroesophageal reflux disease)    Hernia, hiatal    Hypercalcemia 01/06/2021   Left breast mass 01/26/2021   Liver cyst    Malaise 01/06/2021   Need for tetanus, diphtheria, and acellular pertussis (Tdap) vaccine 01/06/2021   Palpitations 01/26/2021   Polyuria 01/06/2021   Scoliosis    Weight loss 01/02/2021   Past Surgical History:  Procedure Laterality Date   CARPAL TUNNEL RELEASE Right 07/01/2023   Procedure: right carpal tunnel release;  Surgeon: Murrell Drivers, MD;  Location: [facility];  Service: Orthopedics;  Laterality: Right;   CARPAL TUNNEL RELEASE Left 11/11/2023   Procedure: LEFT CARPAL TUNNEL RELEASE;  Surgeon: Murrell Drivers, MD;  Location: [facility];  Service: Orthopedics;  Laterality: Left;  30 MIN   CERVICAL SPINE SURGERY     Left fooscrew and plate [id number]      TUBAL LIGATION     Patient Active Problem List   Diagnosis Date Noted   Peripheral neuropathy 03/12/2024   Bilateral knee pain 08/23/2023   Flank pain 03/18/2023   Family history of elevated blood lipids 03/18/2023   Screening for colon cancer 09/10/2022   Gastroesophageal reflux disease without esophagitis 09/10/2022   Needs flu shot 09/10/2022   Vitamin D  deficiency 08/13/2022   Pain in right hip 08/13/2022   Facial rash 02/08/2022   Fibromyalgia 11/09/2021   Fibromyalgia  syndrome 11/09/2021   Positive ANA (antinuclear antibody) 10/12/2021   Impingement syndrome of right shoulder 07/07/2021   Labral tear of shoulder, degenerative, right 07/07/2021   Carpal tunnel syndrome, bilateral 07/07/2021   DDD (degenerative disc disease), cervical 07/07/2021   Other fatigue 05/08/2021   Leg cramps 05/08/2021   Frequency of urination 05/08/2021   Irregular menses 05/08/2021   Scoliosis    Palpitations 01/26/2021   Left breast mass 01/26/2021   Polyuria 01/06/2021   Hypercalcemia 01/06/2021   Malaise 01/06/2021   Need for tetanus, diphtheria, and acellular pertussis (Tdap) vaccine 01/06/2021   Weight loss 01/02/2021   Hyperglycemia 01/02/2021    PCP: Nicholaus Credit, PA-C  REFERRING PROVIDER:  Jeannetta Lonni ORN, MD     REFERRING DIAG:  M77.9 (ICD-10-CM) - Tendinitis  M70.71 (ICD-10-CM) - Bursitis of right hip, unspecified bursa  M25.551 (ICD-10-CM) - Pain in right hip    THERAPY DIAG:  Pain in right hip  Rationale for Evaluation and Treatment: Rehabilitation  ONSET DATE: >6 months   SUBJECTIVE:   SUBJECTIVE STATEMENT: Patient reports pain during and after her long drive over the weekend.   Eval: Patient reports to PT d/t R hip pain that has been present for >6 months. She does not recall any hip related injuries. She notes that she has started to notice her R foot rolling in (  pronation) with any standing. She states that this has already been happening on her L foot for years. She has history of knee pain. Her understanding is that she has some rotation and bone spurs on R side.     PERTINENT HISTORY: Relevant PMHx includes fibromyalgia, scoliosis, peripheral neuropathy, BIL knee pain, vitamin D  deficiency  PAIN:  Are you having pain? Yes: NPRS scale: 3-4/10 current, 5/10 worst  Pain location: R hip and thigh  Pain description: sore Aggravating factors: prolonged standing, driving, sitting with legs crossed on ground Relieving factors:  changing positions  PRECAUTIONS: None  RED FLAGS: None   WEIGHT BEARING RESTRICTIONS: No  FALLS:  Has patient fallen in last 6 months? Yes. Number of falls >3  LIVING ENVIRONMENT: Lives with: lives with their family Lives in: House/apartment Stairs: No Has following equipment at home: None  OCCUPATION: unemployed  PLOF: Independent  PATIENT GOALS: to have less pain in R hip with daily activities including standing, walking, prolonged sitting   NEXT MD VISIT: 10/01/2024  OBJECTIVE:  Note: Objective measures were completed at Evaluation unless otherwise noted.  DIAGNOSTIC FINDINGS:   Per Dr. Jeannetta, Hip xray looked good with no significant osteoarthritis or structural damage.  PATIENT SURVEYS:  LEFS  Extreme difficulty/unable (0), Quite a bit of difficulty (1), Moderate difficulty (2), Little difficulty (3), No difficulty (4) Survey date:  09/22/2024   Any of your usual work, housework or school activities 2  2. Usual hobbies, recreational or sporting activities 1  3. Getting into/out of the bath 3  4. Walking between rooms 4  5. Putting on socks/shoes 4  6. Squatting  2  7. Lifting an object, like a bag of groceries from the floor 4  8. Performing light activities around your home 4  9. Performing heavy activities around your home 4  10. Getting into/out of a car 2  11. Walking 2 blocks 2  12. Walking 1 mile 2  13. Going up/down 10 stairs (1 flight) 2  14. Standing for 1 hour 1  15.  sitting for 1 hour 1  16. Running on even ground 2  17. Running on uneven ground 1  18. Making sharp turns while running fast 0  19. Hopping  0  20. Rolling over in bed 3  Score total:  44/80     THE PATIENT SPECIFIC FUNCTIONAL SCALE  Place score of 0-10 (0 = unable to perform activity and 10 = able to perform activity at the same level as before injury or problem)  Activity Date: 09/22/24    Long Distance Driving (~2 hours) 5    2.     3.     4.      Total Score 5       Total Score = Sum of activity scores/number of activities  Minimally Detectable Change: 3 points (for single activity); 2 points (for average score)  [facility] (nd). The Patient Specific Functional Scale . Retrieved from [email or web address]   COGNITION: Overall cognitive status: Within functional limits for tasks assessed     SENSATION: Not tested  LOWER EXTREMITY MMT:  MMT Right eval Left eval  Hip flexion 4- 4  Hip extension 3 3+  Hip abduction 3 4-  Hip adduction    Hip internal rotation 4- 4  Hip external rotation 3+ 4-  Knee flexion 4 4  Knee extension 3+ 4-  Ankle dorsiflexion 4 4  Ankle plantarflexion    Ankle inversion  Ankle eversion     (Blank rows = not tested)   LOWER EXTREMITY SPECIAL TESTS:  Hip special tests: deferred  FUNCTIONAL TESTS:  Deferred    Balance  09/22/24  Romberg EO on floor  30s  Romberg EC on floor 30s, mod sway  Romberg EO on airex   Romberg EC on airex     Balance Right 09/22/24 Left 09/22/24   Tandem on floor 20s 20s  SLS on floor      DGI Mild impairment: walks 20', uses assistive devices, slower speed, mild gait deviations. (2) Normal: Able to smoothly change walking speed without loss of balance or gait deviation. Shows significant difference in walking speeds between normal, fast and slow paces. (3) Moderate impairment: Performs head turns with moderate change in gait velocity, slows down, staggers, but recovers, can continue to walk. (1) Moderate impairment: Performs head turns with moderate change in gait velocity, slows down, staggers, but recovers, can continue to walk. (1) Mild impairment: pivot turns safely in >3 seconds and stops with no loss of balance. (2) Mild impairment: Is able to step over shoe box, but must slow down and adjust steps to clear box safely. (2) Normal: Is able to walk safely around cones safely without changing gait  speed; no evidence of imbalance. (3) Moderate impairment: Two feet to a stair, must use rail. (1) Total Score: 15/24 Scores below 19 suggest that the subject assessed has a higher risk of prospective falls                                                                                                                                 TREATMENT DATE: [facility] Adult PT Treatment:                                                DATE: 10/27/24 Therapeutic Exercise: NuStep level 5 x 8 mins Seated knee extension 10lbs x 10, 5lbs x 10   Seated hamstring curl 15lbs x 10, 20lbs x 10  SLS clock x 4 B  Neuromuscular re-ed: Obstacle course with hurdles, foam, balance beam, bosu ball, wobble board x 20 mins    [facility] Adult PT Treatment:                                                DATE: 10/19/24 Therapeutic Exercise: Bike level 3 x 5 mins Supine firgure 4 stretch with LTR 2x30 Modified thomas stretch 2x30 RLE  FAQ with ball 3x15 Neuromuscular re-ed: SLS BIL 2x30 Therapeutic Activity: Step ups fwd 8 BIL 2x10 Standing hip abduction/extension 2x10 BIL Standing heel raises 2x20  [facility] Adult PT Treatment:  DATE: 10/14/24 Therapeutic Exercise: Bike level 3 x 5 mins Standing 3 way hip x10 BIL Standing heel raises x20 Neuromuscular re-ed: Tandem stance airex pad BIL x30 (UE support PRN) SLS BIL 2x30  Cone taps x3 SLS 2x30 BIL  Therapeutic Activity: Step-ups fwd/lat 6 BIL 2x10 Side stepping airex balance beam with heel hang x4 laps Side stepping airex balance beam with toe hang x4 laps Fwd walking airex balance beam with 2 hurdles x4 laps   PATIENT EDUCATION:  Education details: reviewed initial home exercise program; discussion of POC, prognosis and goals for skilled PT   Person educated: Patient Education method: Explanation, Demonstration, and Handouts Education comprehension: verbalized understanding, returned demonstration, and needs  further education  HOME EXERCISE PROGRAM: Access Code: [email or web address] URL: [email or web address] Date: 09/22/2024 Prepared by: Marko Molt  Exercises - Clamshell  - 1 x daily - 4 x weekly - 2 sets - 10 reps - Supine Active Straight Leg Raise  - 1 x daily - 4 x weekly - 2 sets - 10 reps - Supine Bridge  - 1 x daily - 4 x weekly - 2 sets - 10 reps - Supine Hip Adduction Isometric with Ball  - 1 x daily - 4 x weekly - 2 sets - 10 reps - 3 sec hold - Seated Long Arc Quad  - 1 x daily - 4 x weekly - 2 sets - 10 reps - 3 sec hold - Heel Toe Raises with Counter Support  - 1 x daily - 4 x weekly - 2 sets - 10 reps  ASSESSMENT:  CLINICAL IMPRESSION: Pt demonstrates poor hip and knee stability with balance exercises. She tends to ER during hamstring curls which could mean either weakness of the medial hamstring or tightness of the lateral hamstring/ITB. Pt reports concern with hip pop when walking, leg length should be assessed next session.  Patient will benefit from skilled PT in order to increase functional independence and hip strength and mobility.   Eval: Raiden is a 48 y.o. female who was seen today for physical therapy evaluation and treatment for persisted R hip pain with strength deficits. She has decreased MMT scores BIL. She is demonstrating diminished static standing balance with narrow BOS. Dynamic balance to be further assessed and f/u visit. She has related pain and difficulty with standing, walking, driving, and sitting with legs crossed. She requires skilled PT services at this time to address relevant deficits and improve overall function.     OBJECTIVE IMPAIRMENTS: decreased activity tolerance, decreased balance, decreased endurance, decreased strength, and pain.   ACTIVITY LIMITATIONS: carrying, sitting, standing, stairs, and locomotion level  PARTICIPATION LIMITATIONS: cleaning, interpersonal relationship, driving, shopping, community activity, and  occupation  PERSONAL FACTORS: Time since onset of injury/illness/exacerbation and 3+ comorbidities: Relevant PMHx includes fibromyalgia, scoliosis, peripheral neuropathy, BIL knee pain, vitamin D  deficiency are also affecting patient's functional outcome.   REHAB POTENTIAL: Fair    CLINICAL DECISION MAKING: Evolving/moderate complexity  EVALUATION COMPLEXITY: Moderate   GOALS: Goals reviewed with patient? YES  SHORT TERM GOALS: Target date: 10/20/2024   Patient will be independent with initial home program at least 3 days/week.  Baseline: provided at eval Goal Status: INITIAL   2.  Patient will demonstrate improved BIL LE MMT scores to at least 4+/5.  Baseline:  Goal Status: INITIAL     LONG TERM GOALS: Target date: 11/17/2024   Patient will report improved overall functional ability with LEFS score of 60/80 or greater.  Baseline: 44/80 Goal Status: INITIAL  2.  Patient will report 7/10 or greater PSFS score for long distance driving.  Baseline: 5/10 Goal status: INITIAL  3.  Patient will have 20/24 on DGI assessment in order to demonstrate decreased fall risk.  Baseline: to be assessed at f/u visit Goal status: INITIAL      PLAN:  PT FREQUENCY: 1-2x/week  PT DURATION: 8 weeks  PLANNED INTERVENTIONS: 97164- PT Re-evaluation, 97750- Physical Performance Testing, 97110-Therapeutic exercises, 97530- Therapeutic activity, [id number]- Neuromuscular re-education, 97535- Self Care, [id number]- Manual therapy, [phone]- Aquatic Therapy, [id number]- Electrical stimulation (unattended), [phone]- Electrical stimulation (manual), 20560 (1-2 muscles), 20561 (3+ muscles)- Dry Needling, Patient/Family education, Balance training, Taping, Joint mobilization, Cryotherapy, and Moist heat  PLAN FOR NEXT SESSION: check leg length    Marijo Berber PT, DPT 10/27/2024 2:52 PM

## 2024-11-03 ENCOUNTER — Ambulatory Visit

## 2024-11-03 DIAGNOSIS — M25551 Pain in right hip: Secondary | ICD-10-CM | POA: Diagnosis not present

## 2024-11-03 NOTE — Therapy (Signed)
 OUTPATIENT PHYSICAL THERAPY TREATMENT   Patient Name: MODEAN MCCULLUM MRN: [id number] DOB:09-27-1976, 48 y.o., female Today's Date: 11/03/2024  END OF SESSION:  PT End of Session - 11/03/24 1403     Visit Number 10    Number of Visits 16    Date for Recertification  11/17/24    Authorization Type BCBS    PT Start Time 1400    PT Stop Time 1450    PT Time Calculation (min) 50 min           Past Medical History:  Diagnosis Date   Carpal tunnel syndrome    Fibromyalgia    GERD (gastroesophageal reflux disease)    Hernia, hiatal    Hypercalcemia 01/06/2021   Left breast mass 01/26/2021   Liver cyst    Malaise 01/06/2021   Need for tetanus, diphtheria, and acellular pertussis (Tdap) vaccine 01/06/2021   Palpitations 01/26/2021   Polyuria 01/06/2021   Scoliosis    Weight loss 01/02/2021   Past Surgical History:  Procedure Laterality Date   CARPAL TUNNEL RELEASE Right 07/01/2023   Procedure: right carpal tunnel release;  Surgeon: Murrell Drivers, MD;  Location: [facility];  Service: Orthopedics;  Laterality: Right;   CARPAL TUNNEL RELEASE Left 11/11/2023   Procedure: LEFT CARPAL TUNNEL RELEASE;  Surgeon: Murrell Drivers, MD;  Location: [facility];  Service: Orthopedics;  Laterality: Left;  30 MIN   CERVICAL SPINE SURGERY     Left fooscrew and plate [id number]      TUBAL LIGATION     Patient Active Problem List   Diagnosis Date Noted   Peripheral neuropathy 03/12/2024   Bilateral knee pain 08/23/2023   Flank pain 03/18/2023   Family history of elevated blood lipids 03/18/2023   Screening for colon cancer 09/10/2022   Gastroesophageal reflux disease without esophagitis 09/10/2022   Needs flu shot 09/10/2022   Vitamin D  deficiency 08/13/2022   Pain in right hip 08/13/2022   Facial rash 02/08/2022   Fibromyalgia 11/09/2021   Fibromyalgia syndrome 11/09/2021   Positive ANA (antinuclear antibody) 10/12/2021   Impingement syndrome of right shoulder  07/07/2021   Labral tear of shoulder, degenerative, right 07/07/2021   Carpal tunnel syndrome, bilateral 07/07/2021   DDD (degenerative disc disease), cervical 07/07/2021   Other fatigue 05/08/2021   Leg cramps 05/08/2021   Frequency of urination 05/08/2021   Irregular menses 05/08/2021   Scoliosis    Palpitations 01/26/2021   Left breast mass 01/26/2021   Polyuria 01/06/2021   Hypercalcemia 01/06/2021   Malaise 01/06/2021   Need for tetanus, diphtheria, and acellular pertussis (Tdap) vaccine 01/06/2021   Weight loss 01/02/2021   Hyperglycemia 01/02/2021    PCP: Nicholaus Credit, PA-C  REFERRING PROVIDER:  Jeannetta Lonni ORN, MD     REFERRING DIAG:  M77.9 (ICD-10-CM) - Tendinitis  M70.71 (ICD-10-CM) - Bursitis of right hip, unspecified bursa  M25.551 (ICD-10-CM) - Pain in right hip    THERAPY DIAG:  Pain in right hip  Rationale for Evaluation and Treatment: Rehabilitation  ONSET DATE: >6 months   SUBJECTIVE:   SUBJECTIVE STATEMENT: Patient reports reduced pain today. She was sore after last visit. Pt notes brain fog that has been an issue for a few months. She is frustrated with her continued health issues.   Eval: Patient reports to PT d/t R hip pain that has been present for >6 months. She does not recall any hip related injuries. She notes that she has started to notice her R foot  rolling in (pronation) with any standing. She states that this has already been happening on her L foot for years. She has history of knee pain. Her understanding is that she has some rotation and bone spurs on R side.     PERTINENT HISTORY: Relevant PMHx includes fibromyalgia, scoliosis, peripheral neuropathy, BIL knee pain, vitamin D  deficiency  PAIN:  Are you having pain? Yes: NPRS scale: 3-4/10 current, 5/10 worst  Pain location: R hip and thigh  Pain description: sore Aggravating factors: prolonged standing, driving, sitting with legs crossed on ground Relieving factors: changing  positions  PRECAUTIONS: None  RED FLAGS: None   WEIGHT BEARING RESTRICTIONS: No  FALLS:  Has patient fallen in last 6 months? Yes. Number of falls >3  LIVING ENVIRONMENT: Lives with: lives with their family Lives in: House/apartment Stairs: No Has following equipment at home: None  OCCUPATION: unemployed  PLOF: Independent  PATIENT GOALS: to have less pain in R hip with daily activities including standing, walking, prolonged sitting   NEXT MD VISIT: 10/01/2024  OBJECTIVE:  Note: Objective measures were completed at Evaluation unless otherwise noted.  DIAGNOSTIC FINDINGS:   Per Dr. Jeannetta, Hip xray looked good with no significant osteoarthritis or structural damage.  PATIENT SURVEYS:  LEFS  Extreme difficulty/unable (0), Quite a bit of difficulty (1), Moderate difficulty (2), Little difficulty (3), No difficulty (4) Survey date:  09/22/2024   Any of your usual work, housework or school activities 2  2. Usual hobbies, recreational or sporting activities 1  3. Getting into/out of the bath 3  4. Walking between rooms 4  5. Putting on socks/shoes 4  6. Squatting  2  7. Lifting an object, like a bag of groceries from the floor 4  8. Performing light activities around your home 4  9. Performing heavy activities around your home 4  10. Getting into/out of a car 2  11. Walking 2 blocks 2  12. Walking 1 mile 2  13. Going up/down 10 stairs (1 flight) 2  14. Standing for 1 hour 1  15.  sitting for 1 hour 1  16. Running on even ground 2  17. Running on uneven ground 1  18. Making sharp turns while running fast 0  19. Hopping  0  20. Rolling over in bed 3  Score total:  44/80     THE PATIENT SPECIFIC FUNCTIONAL SCALE  Place score of 0-10 (0 = unable to perform activity and 10 = able to perform activity at the same level as before injury or problem)  Activity Date: 09/22/24    Long Distance Driving (~2 hours) 5    2.     3.     4.      Total Score 5      Total  Score = Sum of activity scores/number of activities  Minimally Detectable Change: 3 points (for single activity); 2 points (for average score)  [facility] (nd). The Patient Specific Functional Scale . Retrieved from [email or web address]   COGNITION: Overall cognitive status: Within functional limits for tasks assessed     SENSATION: Not tested  LOWER EXTREMITY MMT:  MMT Right eval Left eval  Hip flexion 4- 4  Hip extension 3 3+  Hip abduction 3 4-  Hip adduction    Hip internal rotation 4- 4  Hip external rotation 3+ 4-  Knee flexion 4 4  Knee extension 3+ 4-  Ankle dorsiflexion 4 4  Ankle plantarflexion    Ankle inversion  Ankle eversion     (Blank rows = not tested)   LOWER EXTREMITY SPECIAL TESTS:  Hip special tests: deferred  FUNCTIONAL TESTS:  Deferred    Balance  09/22/24  Romberg EO on floor  30s  Romberg EC on floor 30s, mod sway  Romberg EO on airex   Romberg EC on airex     Balance Right 09/22/24 Left 09/22/24   Tandem on floor 20s 20s  SLS on floor      DGI Mild impairment: walks 20', uses assistive devices, slower speed, mild gait deviations. (2) Normal: Able to smoothly change walking speed without loss of balance or gait deviation. Shows significant difference in walking speeds between normal, fast and slow paces. (3) Moderate impairment: Performs head turns with moderate change in gait velocity, slows down, staggers, but recovers, can continue to walk. (1) Moderate impairment: Performs head turns with moderate change in gait velocity, slows down, staggers, but recovers, can continue to walk. (1) Mild impairment: pivot turns safely in >3 seconds and stops with no loss of balance. (2) Mild impairment: Is able to step over shoe box, but must slow down and adjust steps to clear box safely. (2) Normal: Is able to walk safely around cones safely without changing gait speed; no  evidence of imbalance. (3) Moderate impairment: Two feet to a stair, must use rail. (1) Total Score: 15/24 Scores below 19 suggest that the subject assessed has a higher risk of prospective falls                                                                                                                                 TREATMENT DATE: [facility] Adult PT Treatment:                                                DATE: 11/03/24 Therapeutic Exercise: NuStep level 5 x 10 mins SLR 2x8-10 B Seated gluteal set x 5  Self Care/Home Management:  Discussion of lack of muscle engagement with quads and glutes; homunculus and its role in the brain; how PT can address the poor muscle engagement  [facility] Adult PT Treatment:                                                DATE: 10/27/24 Therapeutic Exercise: NuStep level 5 x 8 mins Seated knee extension 10lbs x 10, 5lbs x 10   Seated hamstring curl 15lbs x 10, 20lbs x 10  SLS clock x 4 B  Neuromuscular re-ed: Obstacle course with hurdles, foam, balance beam, bosu ball, wobble board x 20 mins    [facility] Adult PT Treatment:  DATE: 10/19/24 Therapeutic Exercise: Bike level 3 x 5 mins Supine firgure 4 stretch with LTR 2x30 Modified thomas stretch 2x30 RLE  FAQ with ball 3x15 Neuromuscular re-ed: SLS BIL 2x30 Therapeutic Activity: Step ups fwd 8 BIL 2x10 Standing hip abduction/extension 2x10 BIL Standing heel raises 2x20  [facility] Adult PT Treatment:                                                DATE: 10/14/24 Therapeutic Exercise: Bike level 3 x 5 mins Standing 3 way hip x10 BIL Standing heel raises x20 Neuromuscular re-ed: Tandem stance airex pad BIL x30 (UE support PRN) SLS BIL 2x30  Cone taps x3 SLS 2x30 BIL  Therapeutic Activity: Step-ups fwd/lat 6 BIL 2x10 Side stepping airex balance beam with heel hang x4 laps Side stepping airex balance beam with toe hang x4 laps Fwd walking airex balance  beam with 2 hurdles x4 laps   PATIENT EDUCATION:  Education details: reviewed initial home exercise program; discussion of POC, prognosis and goals for skilled PT   Person educated: Patient Education method: Explanation, Demonstration, and Handouts Education comprehension: verbalized understanding, returned demonstration, and needs further education  HOME EXERCISE PROGRAM: Access Code: [email or web address] URL: [email or web address] Date: 09/22/2024 Prepared by: Marko Molt  Exercises - Clamshell  - 1 x daily - 4 x weekly - 2 sets - 10 reps - Supine Active Straight Leg Raise  - 1 x daily - 4 x weekly - 2 sets - 10 reps - Supine Bridge  - 1 x daily - 4 x weekly - 2 sets - 10 reps - Supine Hip Adduction Isometric with Ball  - 1 x daily - 4 x weekly - 2 sets - 10 reps - 3 sec hold - Seated Long Arc Quad  - 1 x daily - 4 x weekly - 2 sets - 10 reps - 3 sec hold - Heel Toe Raises with Counter Support  - 1 x daily - 4 x weekly - 2 sets - 10 reps  ASSESSMENT:  CLINICAL IMPRESSION: Pt does not demonstrate a leg length discrepancy but she does demonstrate poor quadriceps and gluteal activation. She has difficulty performing a SLR and cannot perform a prone gluteal set. Pt and PT discussed brain to body connection, homunculus, and more listed above. Pt expressed understanding of topics.  Patient will benefit from skilled PT in order to increase functional independence and hip strength and mobility.   Eval: Kateria is a 48 y.o. female who was seen today for physical therapy evaluation and treatment for persisted R hip pain with strength deficits. She has decreased MMT scores BIL. She is demonstrating diminished static standing balance with narrow BOS. Dynamic balance to be further assessed and f/u visit. She has related pain and difficulty with standing, walking, driving, and sitting with legs crossed. She requires skilled PT services at this time to address relevant deficits and improve  overall function.     OBJECTIVE IMPAIRMENTS: decreased activity tolerance, decreased balance, decreased endurance, decreased strength, and pain.   ACTIVITY LIMITATIONS: carrying, sitting, standing, stairs, and locomotion level  PARTICIPATION LIMITATIONS: cleaning, interpersonal relationship, driving, shopping, community activity, and occupation  PERSONAL FACTORS: Time since onset of injury/illness/exacerbation and 3+ comorbidities: Relevant PMHx includes fibromyalgia, scoliosis, peripheral neuropathy, BIL knee pain, vitamin D  deficiency are also affecting patient's functional outcome.   REHAB POTENTIAL: Fair  CLINICAL DECISION MAKING: Evolving/moderate complexity  EVALUATION COMPLEXITY: Moderate   GOALS: Goals reviewed with patient? YES  SHORT TERM GOALS: Target date: 10/20/2024   Patient will be independent with initial home program at least 3 days/week.  Baseline: provided at eval Goal Status: INITIAL   2.  Patient will demonstrate improved BIL LE MMT scores to at least 4+/5.  Baseline:  Goal Status: INITIAL     LONG TERM GOALS: Target date: 11/17/2024   Patient will report improved overall functional ability with LEFS score of 60/80 or greater.  Baseline: 44/80 Goal Status: INITIAL    2.  Patient will report 7/10 or greater PSFS score for long distance driving.  Baseline: 5/10 Goal status: INITIAL  3.  Patient will have 20/24 on DGI assessment in order to demonstrate decreased fall risk.  Baseline: to be assessed at f/u visit Goal status: INITIAL      PLAN:  PT FREQUENCY: 1-2x/week  PT DURATION: 8 weeks  PLANNED INTERVENTIONS: 97164- PT Re-evaluation, 97750- Physical Performance Testing, 97110-Therapeutic exercises, 97530- Therapeutic activity, 97112- Neuromuscular re-education, 97535- Self Care, [id number]- Manual therapy, [phone]- Aquatic Therapy, [id number]- Electrical stimulation (unattended), [phone]- Electrical stimulation (manual), 20560 (1-2 muscles), 20561  (3+ muscles)- Dry Needling, Patient/Family education, Balance training, Taping, Joint mobilization, Cryotherapy, and Moist heat  PLAN FOR NEXT SESSION: quadriceps and gluteal activation (high volume)   Marijo Berber PT, DPT 11/03/2024 3:25 PM

## 2024-11-09 ENCOUNTER — Ambulatory Visit

## 2024-11-09 DIAGNOSIS — M25551 Pain in right hip: Secondary | ICD-10-CM | POA: Diagnosis not present

## 2024-11-09 NOTE — Therapy (Signed)
 OUTPATIENT PHYSICAL THERAPY TREATMENT   Patient Name: Sarah [name] MRN: [id number] DOB:[birth year], 48 y.o., female Today's Date: 11/09/2024  END OF SESSION:  PT End of Session - 11/09/24 1034     Visit Number 11    Number of Visits 16    Date for Recertification  11/17/24    Authorization Type BCBS    PT Start Time 1048    PT Stop Time 1128    PT Time Calculation (min) 40 min    Activity Tolerance Patient tolerated treatment well    Behavior During Therapy [facility] for tasks assessed/performed            Past Medical History:  Diagnosis Date   Carpal tunnel syndrome    Fibromyalgia    GERD (gastroesophageal reflux disease)    Hernia, hiatal    Hypercalcemia 01/06/2021   Left breast mass 01/26/2021   Liver cyst    Malaise 01/06/2021   Need for tetanus, diphtheria, and acellular pertussis (Tdap) vaccine 01/06/2021   Palpitations 01/26/2021   Polyuria 01/06/2021   Scoliosis    Weight loss 01/02/2021   Past Surgical History:  Procedure Laterality Date   CARPAL TUNNEL RELEASE Right 07/01/2023   Procedure: right carpal tunnel release;  Surgeon: Murrell Drivers, MD;  Location: [facility];  Service: Orthopedics;  Laterality: Right;   CARPAL TUNNEL RELEASE Left 11/11/2023   Procedure: LEFT CARPAL TUNNEL RELEASE;  Surgeon: Murrell Drivers, MD;  Location: [facility];  Service: Orthopedics;  Laterality: Left;  30 MIN   CERVICAL SPINE SURGERY     Left fooscrew and plate [id number]      TUBAL LIGATION     Patient Active Problem List   Diagnosis Date Noted   Peripheral neuropathy 03/12/2024   Bilateral knee pain 08/23/2023   Flank pain 03/18/2023   Family history of elevated blood lipids 03/18/2023   Screening for colon cancer 09/10/2022   Gastroesophageal reflux disease without esophagitis 09/10/2022   Needs flu shot 09/10/2022   Vitamin D  deficiency 08/13/2022   Pain in right hip 08/13/2022   Facial rash 02/08/2022   Fibromyalgia 11/09/2021    Fibromyalgia syndrome 11/09/2021   Positive ANA (antinuclear antibody) 10/12/2021   Impingement syndrome of right shoulder 07/07/2021   Labral tear of shoulder, degenerative, right 07/07/2021   Carpal tunnel syndrome, bilateral 07/07/2021   DDD (degenerative disc disease), cervical 07/07/2021   Other fatigue 05/08/2021   Leg cramps 05/08/2021   Frequency of urination 05/08/2021   Irregular menses 05/08/2021   Scoliosis    Palpitations 01/26/2021   Left breast mass 01/26/2021   Polyuria 01/06/2021   Hypercalcemia 01/06/2021   Malaise 01/06/2021   Need for tetanus, diphtheria, and acellular pertussis (Tdap) vaccine 01/06/2021   Weight loss 01/02/2021   Hyperglycemia 01/02/2021    PCP: Nicholaus Credit, PA-C  REFERRING PROVIDER:  Jeannetta Lonni ORN, MD     REFERRING DIAG:  M77.9 (ICD-10-CM) - Tendinitis  M70.71 (ICD-10-CM) - Bursitis of right hip, unspecified bursa  M25.551 (ICD-10-CM) - Pain in right hip    THERAPY DIAG:  Pain in right hip  Rationale for Evaluation and Treatment: Rehabilitation  ONSET DATE: >6 months   SUBJECTIVE:   SUBJECTIVE STATEMENT: Patient reports increased pain due to the cold weather.  Eval: Patient reports to PT d/t R hip pain that has been present for >6 months. She does not recall any hip related injuries. She notes that she has started to notice her R foot rolling in (pronation) with  any standing. She states that this has already been happening on her L foot for years. She has history of knee pain. Her understanding is that she has some rotation and bone spurs on R side.     PERTINENT HISTORY: Relevant PMHx includes fibromyalgia, scoliosis, peripheral neuropathy, BIL knee pain, vitamin D  deficiency  PAIN:  Are you having pain? Yes: NPRS scale: 3-4/10 current, 5/10 worst  Pain location: R hip and thigh  Pain description: sore Aggravating factors: prolonged standing, driving, sitting with legs crossed on ground Relieving factors: changing  positions  PRECAUTIONS: None  RED FLAGS: None   WEIGHT BEARING RESTRICTIONS: No  FALLS:  Has patient fallen in last 6 months? Yes. Number of falls >3  LIVING ENVIRONMENT: Lives with: lives with their family Lives in: House/apartment Stairs: No Has following equipment at home: None  OCCUPATION: unemployed  PLOF: Independent  PATIENT GOALS: to have less pain in R hip with daily activities including standing, walking, prolonged sitting   NEXT MD VISIT: 10/01/2024  OBJECTIVE:  Note: Objective measures were completed at Evaluation unless otherwise noted.  DIAGNOSTIC FINDINGS:   Per Dr. Jeannetta, Hip xray looked good with no significant osteoarthritis or structural damage.  PATIENT SURVEYS:  LEFS  Extreme difficulty/unable (0), Quite a bit of difficulty (1), Moderate difficulty (2), Little difficulty (3), No difficulty (4) Survey date:  09/22/2024   Any of your usual work, housework or school activities 2  2. Usual hobbies, recreational or sporting activities 1  3. Getting into/out of the bath 3  4. Walking between rooms 4  5. Putting on socks/shoes 4  6. Squatting  2  7. Lifting an object, like a bag of groceries from the floor 4  8. Performing light activities around your home 4  9. Performing heavy activities around your home 4  10. Getting into/out of a car 2  11. Walking 2 blocks 2  12. Walking 1 mile 2  13. Going up/down 10 stairs (1 flight) 2  14. Standing for 1 hour 1  15.  sitting for 1 hour 1  16. Running on even ground 2  17. Running on uneven ground 1  18. Making sharp turns while running fast 0  19. Hopping  0  20. Rolling over in bed 3  Score total:  44/80     THE PATIENT SPECIFIC FUNCTIONAL SCALE  Place score of 0-10 (0 = unable to perform activity and 10 = able to perform activity at the same level as before injury or problem)  Activity Date: 09/22/24    Long Distance Driving (~2 hours) 5    2.     3.     4.      Total Score 5      Total  Score = Sum of activity scores/number of activities  Minimally Detectable Change: 3 points (for single activity); 2 points (for average score)  [facility] (nd). The Patient Specific Functional Scale . Retrieved from [email or web address]   COGNITION: Overall cognitive status: Within functional limits for tasks assessed     SENSATION: Not tested  LOWER EXTREMITY MMT:  MMT Right eval Left eval  Hip flexion 4- 4  Hip extension 3 3+  Hip abduction 3 4-  Hip adduction    Hip internal rotation 4- 4  Hip external rotation 3+ 4-  Knee flexion 4 4  Knee extension 3+ 4-  Ankle dorsiflexion 4 4  Ankle plantarflexion    Ankle inversion    Ankle eversion     (  Blank rows = not tested)   LOWER EXTREMITY SPECIAL TESTS:  Hip special tests: deferred  FUNCTIONAL TESTS:  Deferred    Balance  09/22/24  Romberg EO on floor  30s  Romberg EC on floor 30s, mod sway  Romberg EO on airex   Romberg EC on airex     Balance Right 09/22/24 Left 09/22/24   Tandem on floor 20s 20s  SLS on floor      DGI Mild impairment: walks 20', uses assistive devices, slower speed, mild gait deviations. (2) Normal: Able to smoothly change walking speed without loss of balance or gait deviation. Shows significant difference in walking speeds between normal, fast and slow paces. (3) Moderate impairment: Performs head turns with moderate change in gait velocity, slows down, staggers, but recovers, can continue to walk. (1) Moderate impairment: Performs head turns with moderate change in gait velocity, slows down, staggers, but recovers, can continue to walk. (1) Mild impairment: pivot turns safely in >3 seconds and stops with no loss of balance. (2) Mild impairment: Is able to step over shoe box, but must slow down and adjust steps to clear box safely. (2) Normal: Is able to walk safely around cones safely without changing gait speed; no  evidence of imbalance. (3) Moderate impairment: Two feet to a stair, must use rail. (1) Total Score: 15/24 Scores below 19 suggest that the subject assessed has a higher risk of prospective falls                                                                                                                                 TREATMENT DATE: [facility] Adult PT Treatment:                                                DATE: 11/09/24 Therapeutic Exercise: NuStep level 5 x 8 mins Neuro Re-Ed: Gluteal set 30x5 hold  Bridge 2x15 S/L hip ABD 2x15 SLR 3x10  [facility] Adult PT Treatment:                                                DATE: 10/27/24 Therapeutic Exercise: NuStep level 5 x 8 mins Seated knee extension 10lbs x 10, 5lbs x 10   Seated hamstring curl 15lbs x 10, 20lbs x 10  SLS clock x 4 B  Neuromuscular re-ed: Obstacle course with hurdles, foam, balance beam, bosu ball, wobble board x 20 mins    [facility] Adult PT Treatment:  DATE: 10/19/24 Therapeutic Exercise: Bike level 3 x 5 mins Supine firgure 4 stretch with LTR 2x30 Modified thomas stretch 2x30 RLE  FAQ with ball 3x15 Neuromuscular re-ed: SLS BIL 2x30 Therapeutic Activity: Step ups fwd 8 BIL 2x10 Standing hip abduction/extension 2x10 BIL Standing heel raises 2x20  [facility] Adult PT Treatment:                                                DATE: 10/14/24 Therapeutic Exercise: Bike level 3 x 5 mins Standing 3 way hip x10 BIL Standing heel raises x20 Neuromuscular re-ed: Tandem stance airex pad BIL x30 (UE support PRN) SLS BIL 2x30  Cone taps x3 SLS 2x30 BIL  Therapeutic Activity: Step-ups fwd/lat 6 BIL 2x10 Side stepping airex balance beam with heel hang x4 laps Side stepping airex balance beam with toe hang x4 laps Fwd walking airex balance beam with 2 hurdles x4 laps   PATIENT EDUCATION:  Education details: reviewed initial home exercise program; discussion of POC,  prognosis and goals for skilled PT   Person educated: Patient Education method: Explanation, Demonstration, and Handouts Education comprehension: verbalized understanding, returned demonstration, and needs further education  HOME EXERCISE PROGRAM: Access Code: [email or web address] URL: [email or web address] Date: 09/22/2024 Prepared by: Marko Molt  Exercises - Clamshell  - 1 x daily - 4 x weekly - 2 sets - 10 reps - Supine Active Straight Leg Raise  - 1 x daily - 4 x weekly - 2 sets - 10 reps - Supine Bridge  - 1 x daily - 4 x weekly - 2 sets - 10 reps - Supine Hip Adduction Isometric with Ball  - 1 x daily - 4 x weekly - 2 sets - 10 reps - 3 sec hold - Seated Long Arc Quad  - 1 x daily - 4 x weekly - 2 sets - 10 reps - 3 sec hold - Heel Toe Raises with Counter Support  - 1 x daily - 4 x weekly - 2 sets - 10 reps  ASSESSMENT:  CLINICAL IMPRESSION: Pt guided through high volume gluteal and quad strengthening exercises. She does not connect to her glutes well as she says she cannot feel them contract. Pt has good contraction of her quadriceps. The lack of gluteal contract is leading to weakness and poor body mechanics.  Patient will benefit from skilled PT in order to increase functional independence and hip strength and mobility.   Eval: Sarah [name] is a 48 y.o. female who was seen today for physical therapy evaluation and treatment for persisted R hip pain with strength deficits. She has decreased MMT scores BIL. She is demonstrating diminished static standing balance with narrow BOS. Dynamic balance to be further assessed and f/u visit. She has related pain and difficulty with standing, walking, driving, and sitting with legs crossed. She requires skilled PT services at this time to address relevant deficits and improve overall function.     OBJECTIVE IMPAIRMENTS: decreased activity tolerance, decreased balance, decreased endurance, decreased strength, and pain.   ACTIVITY  LIMITATIONS: carrying, sitting, standing, stairs, and locomotion level  PARTICIPATION LIMITATIONS: cleaning, interpersonal relationship, driving, shopping, community activity, and occupation  PERSONAL FACTORS: Time since onset of injury/illness/exacerbation and 3+ comorbidities: Relevant PMHx includes fibromyalgia, scoliosis, peripheral neuropathy, BIL knee pain, vitamin D  deficiency are also affecting patient's functional outcome.   REHAB POTENTIAL: Fair    CLINICAL  DECISION MAKING: Evolving/moderate complexity  EVALUATION COMPLEXITY: Moderate   GOALS: Goals reviewed with patient? YES  SHORT TERM GOALS: Target date: 10/20/2024   Patient will be independent with initial home program at least 3 days/week.  Baseline: provided at eval Goal Status: INITIAL   2.  Patient will demonstrate improved BIL LE MMT scores to at least 4+/5.  Baseline:  Goal Status: INITIAL     LONG TERM GOALS: Target date: 11/17/2024   Patient will report improved overall functional ability with LEFS score of 60/80 or greater.  Baseline: 44/80 Goal Status: INITIAL    2.  Patient will report 7/10 or greater PSFS score for long distance driving.  Baseline: 5/10 Goal status: INITIAL  3.  Patient will have 20/24 on DGI assessment in order to demonstrate decreased fall risk.  Baseline: to be assessed at f/u visit Goal status: INITIAL      PLAN:  PT FREQUENCY: 1-2x/week  PT DURATION: 8 weeks  PLANNED INTERVENTIONS: 97164- PT Re-evaluation, 97750- Physical Performance Testing, 97110-Therapeutic exercises, 97530- Therapeutic activity, 97112- Neuromuscular re-education, 97535- Self Care, [id number]- Manual therapy, [phone]- Aquatic Therapy, [id number]- Electrical stimulation (unattended), [phone]- Electrical stimulation (manual), 20560 (1-2 muscles), 20561 (3+ muscles)- Dry Needling, Patient/Family education, Balance training, Taping, Joint mobilization, Cryotherapy, and Moist heat  PLAN FOR NEXT SESSION:  quadriceps and gluteal activation (high volume)   Marijo Berber PT, DPT 11/09/2024 11:19 AM

## 2024-11-16 ENCOUNTER — Encounter: Payer: Self-pay | Admitting: Internal Medicine

## 2024-11-16 ENCOUNTER — Encounter: Payer: Self-pay | Admitting: Cardiology

## 2024-11-16 ENCOUNTER — Ambulatory Visit

## 2024-11-16 DIAGNOSIS — M25551 Pain in right hip: Secondary | ICD-10-CM

## 2024-11-16 NOTE — Therapy (Unsigned)
 " OUTPATIENT PHYSICAL THERAPY TREATMENT   Patient Name: Sarah [name] MRN: [id number] DOB:24-Dec-1975, 48 y.o., female Today's Date: 11/16/2024  END OF SESSION:  PT End of Session - 11/16/24 1023     Visit Number 12    Number of Visits 16    Date for Recertification  11/17/24    Authorization Type BCBS    PT Start Time 1001    PT Stop Time 1043    PT Time Calculation (min) 42 min    Activity Tolerance Patient tolerated treatment well    Behavior During Therapy [facility] for tasks assessed/performed          Past Medical History:  Diagnosis Date   Carpal tunnel syndrome    Fibromyalgia    GERD (gastroesophageal reflux disease)    Hernia, hiatal    Hypercalcemia 01/06/2021   Left breast mass 01/26/2021   Liver cyst    Malaise 01/06/2021   Need for tetanus, diphtheria, and acellular pertussis (Tdap) vaccine 01/06/2021   Palpitations 01/26/2021   Polyuria 01/06/2021   Scoliosis    Weight loss 01/02/2021   Past Surgical History:  Procedure Laterality Date   CARPAL TUNNEL RELEASE Right 07/01/2023   Procedure: right carpal tunnel release;  Surgeon: Murrell Drivers, MD;  Location: [facility];  Service: Orthopedics;  Laterality: Right;   CARPAL TUNNEL RELEASE Left 11/11/2023   Procedure: LEFT CARPAL TUNNEL RELEASE;  Surgeon: Murrell Drivers, MD;  Location: [facility];  Service: Orthopedics;  Laterality: Left;  30 MIN   CERVICAL SPINE SURGERY     Left fooscrew and plate [id number]      TUBAL LIGATION     Patient Active Problem List   Diagnosis Date Noted   Peripheral neuropathy 03/12/2024   Bilateral knee pain 08/23/2023   Flank pain 03/18/2023   Family history of elevated blood lipids 03/18/2023   Screening for colon cancer 09/10/2022   Gastroesophageal reflux disease without esophagitis 09/10/2022   Needs flu shot 09/10/2022   Vitamin D  deficiency 08/13/2022   Pain in right hip 08/13/2022   Facial rash 02/08/2022   Fibromyalgia 11/09/2021   Fibromyalgia  syndrome 11/09/2021   Positive ANA (antinuclear antibody) 10/12/2021   Impingement syndrome of right shoulder 07/07/2021   Labral tear of shoulder, degenerative, right 07/07/2021   Carpal tunnel syndrome, bilateral 07/07/2021   DDD (degenerative disc disease), cervical 07/07/2021   Other fatigue 05/08/2021   Leg cramps 05/08/2021   Frequency of urination 05/08/2021   Irregular menses 05/08/2021   Scoliosis    Palpitations 01/26/2021   Left breast mass 01/26/2021   Polyuria 01/06/2021   Hypercalcemia 01/06/2021   Malaise 01/06/2021   Need for tetanus, diphtheria, and acellular pertussis (Tdap) vaccine 01/06/2021   Weight loss 01/02/2021   Hyperglycemia 01/02/2021    PCP: Nicholaus Credit, PA-C  REFERRING PROVIDER:  Jeannetta Lonni ORN, MD     REFERRING DIAG:  M77.9 (ICD-10-CM) - Tendinitis  M70.71 (ICD-10-CM) - Bursitis of right hip, unspecified bursa  M25.551 (ICD-10-CM) - Pain in right hip    THERAPY DIAG:  Pain in right hip  Rationale for Evaluation and Treatment: Rehabilitation  ONSET DATE: >6 months   SUBJECTIVE:   SUBJECTIVE STATEMENT: Progress Note: Patient reports no change in her hip pain since the start of PT. Pt continues to have pain when crossing legs and squatting. She also notes feeling wobbly and shakey when squatting. Pt reports feeling off balance when standing on one leg.   Eval: Patient reports to  PT d/t R hip pain that has been present for >6 months. She does not recall any hip related injuries. She notes that she has started to notice her R foot rolling in (pronation) with any standing. She states that this has already been happening on her L foot for years. She has history of knee pain. Her understanding is that she has some rotation and bone spurs on R side.     PERTINENT HISTORY: Relevant PMHx includes fibromyalgia, scoliosis, peripheral neuropathy, BIL knee pain, vitamin D  deficiency  PAIN:  Are you having pain? Yes: NPRS scale: 3-4/10  current, 5/10 worst  Pain location: R hip and thigh  Pain description: sore Aggravating factors: prolonged standing, driving, sitting with legs crossed on ground Relieving factors: changing positions  PRECAUTIONS: None  RED FLAGS: None   WEIGHT BEARING RESTRICTIONS: No  FALLS:  Has patient fallen in last 6 months? Yes. Number of falls >3  LIVING ENVIRONMENT: Lives with: lives with their family Lives in: House/apartment Stairs: No Has following equipment at home: None  OCCUPATION: unemployed  PLOF: Independent  PATIENT GOALS: to have less pain in R hip with daily activities including standing, walking, prolonged sitting   NEXT MD VISIT: multi provider follow up   OBJECTIVE:  Note: Objective measures were completed at Evaluation unless otherwise noted.  DIAGNOSTIC FINDINGS:   Per Dr. Jeannetta, Hip xray looked good with no significant osteoarthritis or structural damage.  PATIENT SURVEYS:  LEFS  Extreme difficulty/unable (0), Quite a bit of difficulty (1), Moderate difficulty (2), Little difficulty (3), No difficulty (4) Survey date:  09/22/2024  11/16/2024  Any of your usual work, housework or school activities 2 2  2. Usual hobbies, recreational or sporting activities 1 2  3. Getting into/out of the bath 3 2  4. Walking between rooms 4 3  5. Putting on socks/shoes 4 3  6. Squatting  2 1  7. Lifting an object, like a bag of groceries from the floor 4 3  8. Performing light activities around your home 4 3  9. Performing heavy activities around your home 4 1   10. Getting into/out of a car 2 2  11. Walking 2 blocks 2 2  12. Walking 1 mile 2 1  13. Going up/down 10 stairs (1 flight) 2 2  14. Standing for 1 hour 1 2  15.  sitting for 1 hour 1 2  16. Running on even ground 2 0  17. Running on uneven ground 1 0  18. Making sharp turns while running fast 0 1  19. Hopping  0 2  20. Rolling over in bed 3 2  Score total:  44/80 36/80     THE PATIENT SPECIFIC  FUNCTIONAL SCALE  Place score of 0-10 (0 = unable to perform activity and 10 = able to perform activity at the same level as before injury or problem)  Activity Date: 09/22/24    Long Distance Driving (~2 hours) 5    2.     3.     4.      Total Score 5      Total Score = Sum of activity scores/number of activities  Minimally Detectable Change: 3 points (for single activity); 2 points (for average score)  [facility] (nd). The Patient Specific Functional Scale . Retrieved from [email or web address]   COGNITION: Overall cognitive status: Within functional limits for tasks assessed     SENSATION: Not tested  LOWER EXTREMITY MMT:  MMT Right eval  Left eval Right  11/16/2024 Left 11/16/2024  Hip flexion 4- 4 4- 4  Hip extension 3 3+ 3 3+  Hip abduction 3 4- 3 4-  Hip adduction      Hip internal rotation 4- 4 4- 4  Hip external rotation 3+ 4- 3+ 4-  Knee flexion 4 4 4 4   Knee extension 3+ 4- 3+ 4-  Ankle dorsiflexion 4 4 4 4   Ankle plantarflexion      Ankle inversion      Ankle eversion       (Blank rows = not tested)   LOWER EXTREMITY SPECIAL TESTS:  Hip special tests: deferred  FUNCTIONAL TESTS:  Deferred    Balance  09/22/24  Romberg EO on floor  30s  Romberg EC on floor 30s, mod sway  Romberg EO on airex   Romberg EC on airex     Balance Right 09/22/24 Left 09/22/24   Tandem on floor 20s 20s  SLS on floor      DGI Mild impairment: walks 20', uses assistive devices, slower speed, mild gait deviations. (2) Normal: Able to smoothly change walking speed without loss of balance or gait deviation. Shows significant difference in walking speeds between normal, fast and slow paces. (3) Moderate impairment: Performs head turns with moderate change in gait velocity, slows down, staggers, but recovers, can continue to walk. (1) Moderate impairment: Performs head turns with moderate change in gait  velocity, slows down, staggers, but recovers, can continue to walk. (1) Mild impairment: pivot turns safely in >3 seconds and stops with no loss of balance. (2) Mild impairment: Is able to step over shoe box, but must slow down and adjust steps to clear box safely. (2) Normal: Is able to walk safely around cones safely without changing gait speed; no evidence of imbalance. (3) Moderate impairment: Two feet to a stair, must use rail. (1) Total Score: 15/24 Scores below 19 suggest that the subject assessed has a higher risk of prospective falls                                                                                                                                 TREATMENT DATE: [facility] Adult PT Treatment:                                                DATE: 11/16/24 Therapeutic Activity: Tests and measures for progress note Neuro Re-Ed: Russian estim 10/50, 50mA, gluteal sets, bridge, bridge with knees extended x 20 mins   [facility] Adult PT Treatment:  DATE: 10/27/24 Therapeutic Exercise: NuStep level 5 x 8 mins Seated knee extension 10lbs x 10, 5lbs x 10   Seated hamstring curl 15lbs x 10, 20lbs x 10  SLS clock x 4 B  Neuromuscular re-ed: Obstacle course with hurdles, foam, balance beam, bosu ball, wobble board x 20 mins    [facility] Adult PT Treatment:                                                DATE: 10/19/24 Therapeutic Exercise: Bike level 3 x 5 mins Supine firgure 4 stretch with LTR 2x30 Modified thomas stretch 2x30 RLE  FAQ with ball 3x15 Neuromuscular re-ed: SLS BIL 2x30 Therapeutic Activity: Step ups fwd 8 BIL 2x10 Standing hip abduction/extension 2x10 BIL Standing heel raises 2x20  [facility] Adult PT Treatment:                                                DATE: 10/14/24 Therapeutic Exercise: Bike level 3 x 5 mins Standing 3 way hip x10 BIL Standing heel raises x20 Neuromuscular re-ed: Tandem stance airex pad BIL x30  (UE support PRN) SLS BIL 2x30  Cone taps x3 SLS 2x30 BIL  Therapeutic Activity: Step-ups fwd/lat 6 BIL 2x10 Side stepping airex balance beam with heel hang x4 laps Side stepping airex balance beam with toe hang x4 laps Fwd walking airex balance beam with 2 hurdles x4 laps   PATIENT EDUCATION:  Education details: reviewed initial home exercise program; discussion of POC, prognosis and goals for skilled PT   Person educated: Patient Education method: Explanation, Demonstration, and Handouts Education comprehension: verbalized understanding, returned demonstration, and needs further education  HOME EXERCISE PROGRAM: Access Code: [email or web address] URL: [email or web address] Date: 09/22/2024 Prepared by: Marko Molt  Exercises - Clamshell  - 1 x daily - 4 x weekly - 2 sets - 10 reps - Supine Active Straight Leg Raise  - 1 x daily - 4 x weekly - 2 sets - 10 reps - Supine Bridge  - 1 x daily - 4 x weekly - 2 sets - 10 reps - Supine Hip Adduction Isometric with Ball  - 1 x daily - 4 x weekly - 2 sets - 10 reps - 3 sec hold - Seated Long Arc Quad  - 1 x daily - 4 x weekly - 2 sets - 10 reps - 3 sec hold - Heel Toe Raises with Counter Support  - 1 x daily - 4 x weekly - 2 sets - 10 reps  ASSESSMENT:  CLINICAL IMPRESSION:  Progress Note: Patient will benefit from skilled PT in order to increase functional independence and hip strength and mobility.   Eval: Sarah [name] is a 48 y.o. female who was seen today for physical therapy evaluation and treatment for persisted R hip pain with strength deficits. She has decreased MMT scores BIL. She is demonstrating diminished static standing balance with narrow BOS. Dynamic balance to be further assessed and f/u visit. She has related pain and difficulty with standing, walking, driving, and sitting with legs crossed. She requires skilled PT services at this time to address relevant deficits and improve overall function.     OBJECTIVE  IMPAIRMENTS: decreased activity tolerance, decreased balance, decreased endurance, decreased strength, and  pain.   ACTIVITY LIMITATIONS: carrying, sitting, standing, stairs, and locomotion level  PARTICIPATION LIMITATIONS: cleaning, interpersonal relationship, driving, shopping, community activity, and occupation  PERSONAL FACTORS: Time since onset of injury/illness/exacerbation and 3+ comorbidities: Relevant PMHx includes fibromyalgia, scoliosis, peripheral neuropathy, BIL knee pain, vitamin D  deficiency are also affecting patient's functional outcome.   REHAB POTENTIAL: Fair    CLINICAL DECISION MAKING: Evolving/moderate complexity  EVALUATION COMPLEXITY: Moderate   GOALS: Goals reviewed with patient? YES  SHORT TERM GOALS: Target date: 10/20/2024   Patient will be independent with initial home program at least 3 days/week.  Baseline: provided at eval Goal Status: MET  2.  Patient will demonstrate improved BIL LE MMT scores to at least 4+/5.  Baseline:  Goal Status: PROGRESSING    LONG TERM GOALS: Target date: 12/07/2024   Patient will report improved overall functional ability with LEFS score of 60/80 or greater.  Baseline: 44/80 11/16/2024: 36/80 Goal Status: PROGRESSING    2.  Patient will report 7/10 or greater PSFS score for long distance driving.  Baseline: 5/10 Goal status: INITIAL  3.  Patient will have 20/24 on DGI assessment in order to demonstrate decreased fall risk.  Baseline: to be assessed at f/u visit Goal status: INITIAL      PLAN:  PT FREQUENCY: 1-2x/week  PT DURATION: 8 weeks  PLANNED INTERVENTIONS: 97164- PT Re-evaluation, 97750- Physical Performance Testing, 97110-Therapeutic exercises, 97530- Therapeutic activity, 97112- Neuromuscular re-education, 97535- Self Care, [id number]- Manual therapy, [phone]- Aquatic Therapy, [id number]- Electrical stimulation (unattended), [phone]- Electrical stimulation (manual), 20560 (1-2 muscles), 20561 (3+ muscles)-  Dry Needling, Patient/Family education, Balance training, Taping, Joint mobilization, Cryotherapy, and Moist heat  PLAN FOR NEXT SESSION: quadriceps and gluteal activation (high volume)   Marijo Berber PT, DPT 11/16/2024 10:24 AM   "

## 2024-11-24 ENCOUNTER — Ambulatory Visit

## 2024-11-24 DIAGNOSIS — M25551 Pain in right hip: Secondary | ICD-10-CM

## 2024-11-24 NOTE — Therapy (Signed)
 " OUTPATIENT PHYSICAL THERAPY TREATMENT NOTE   Patient Name: KRISTEN FROMM MRN: [id number] DOB:[birth year], 48 y.o., female Today's Date: 11/24/2024  END OF SESSION:  PT End of Session - 11/24/24 1356     Visit Number 13    Number of Visits 16    Date for Recertification  12/07/24    Authorization Type BCBS    PT Start Time 1400    PT Stop Time 1438    PT Time Calculation (min) 38 min    Activity Tolerance Patient tolerated treatment well    Behavior During Therapy [facility] for tasks assessed/performed           Past Medical History:  Diagnosis Date   Carpal tunnel syndrome    Fibromyalgia    GERD (gastroesophageal reflux disease)    Hernia, hiatal    Hypercalcemia 01/06/2021   Left breast mass 01/26/2021   Liver cyst    Malaise 01/06/2021   Need for tetanus, diphtheria, and acellular pertussis (Tdap) vaccine 01/06/2021   Palpitations 01/26/2021   Polyuria 01/06/2021   Scoliosis    Weight loss 01/02/2021   Past Surgical History:  Procedure Laterality Date   CARPAL TUNNEL RELEASE Right 07/01/2023   Procedure: right carpal tunnel release;  Surgeon: Murrell Drivers, MD;  Location: [facility];  Service: Orthopedics;  Laterality: Right;   CARPAL TUNNEL RELEASE Left 11/11/2023   Procedure: LEFT CARPAL TUNNEL RELEASE;  Surgeon: Murrell Drivers, MD;  Location: [facility];  Service: Orthopedics;  Laterality: Left;  30 MIN   CERVICAL SPINE SURGERY     Left fooscrew and plate [id number]      TUBAL LIGATION     Patient Active Problem List   Diagnosis Date Noted   Peripheral neuropathy 03/12/2024   Bilateral knee pain 08/23/2023   Flank pain 03/18/2023   Family history of elevated blood lipids 03/18/2023   Screening for colon cancer 09/10/2022   Gastroesophageal reflux disease without esophagitis 09/10/2022   Needs flu shot 09/10/2022   Vitamin D  deficiency 08/13/2022   Pain in right hip 08/13/2022   Facial rash 02/08/2022   Fibromyalgia 11/09/2021    Fibromyalgia syndrome 11/09/2021   Positive ANA (antinuclear antibody) 10/12/2021   Impingement syndrome of right shoulder 07/07/2021   Labral tear of shoulder, degenerative, right 07/07/2021   Carpal tunnel syndrome, bilateral 07/07/2021   DDD (degenerative disc disease), cervical 07/07/2021   Other fatigue 05/08/2021   Leg cramps 05/08/2021   Frequency of urination 05/08/2021   Irregular menses 05/08/2021   Scoliosis    Palpitations 01/26/2021   Left breast mass 01/26/2021   Polyuria 01/06/2021   Hypercalcemia 01/06/2021   Malaise 01/06/2021   Need for tetanus, diphtheria, and acellular pertussis (Tdap) vaccine 01/06/2021   Weight loss 01/02/2021   Hyperglycemia 01/02/2021    PCP: Nicholaus Credit, PA-C  REFERRING PROVIDER:  Jeannetta Lonni ORN, MD     REFERRING DIAG:  M77.9 (ICD-10-CM) - Tendinitis  M70.71 (ICD-10-CM) - Bursitis of right hip, unspecified bursa  M25.551 (ICD-10-CM) - Pain in right hip    THERAPY DIAG:  Pain in right hip  Rationale for Evaluation and Treatment: Rehabilitation  ONSET DATE: >6 months   SUBJECTIVE:   SUBJECTIVE STATEMENT: Patient reports that she generally does not feel well, still having pain in the hip.   Eval: Patient reports to PT d/t R hip pain that has been present for >6 months. She does not recall any hip related injuries. She notes that she has started to  notice her R foot rolling in (pronation) with any standing. She states that this has already been happening on her L foot for years. She has history of knee pain. Her understanding is that she has some rotation and bone spurs on R side.     PERTINENT HISTORY: Relevant PMHx includes fibromyalgia, scoliosis, peripheral neuropathy, BIL knee pain, vitamin D  deficiency  PAIN:  Are you having pain? Yes: NPRS scale: 3-4/10 current, 5/10 worst  Pain location: R hip and thigh  Pain description: sore Aggravating factors: prolonged standing, driving, sitting with legs crossed on  ground Relieving factors: changing positions  PRECAUTIONS: None  RED FLAGS: None   WEIGHT BEARING RESTRICTIONS: No  FALLS:  Has patient fallen in last 6 months? Yes. Number of falls >3  LIVING ENVIRONMENT: Lives with: lives with their family Lives in: House/apartment Stairs: No Has following equipment at home: None  OCCUPATION: unemployed  PLOF: Independent  PATIENT GOALS: to have less pain in R hip with daily activities including standing, walking, prolonged sitting   NEXT MD VISIT: multi provider follow up   OBJECTIVE:  Note: Objective measures were completed at Evaluation unless otherwise noted.  DIAGNOSTIC FINDINGS:   Per Dr. Jeannetta, Hip xray looked good with no significant osteoarthritis or structural damage.  PATIENT SURVEYS:  LEFS  Extreme difficulty/unable (0), Quite a bit of difficulty (1), Moderate difficulty (2), Little difficulty (3), No difficulty (4) Survey date:  09/22/2024  11/16/2024  Any of your usual work, housework or school activities 2 2  2. Usual hobbies, recreational or sporting activities 1 2  3. Getting into/out of the bath 3 2  4. Walking between rooms 4 3  5. Putting on socks/shoes 4 3  6. Squatting  2 1  7. Lifting an object, like a bag of groceries from the floor 4 3  8. Performing light activities around your home 4 3  9. Performing heavy activities around your home 4 1   10. Getting into/out of a car 2 2  11. Walking 2 blocks 2 2  12. Walking 1 mile 2 1  13. Going up/down 10 stairs (1 flight) 2 2  14. Standing for 1 hour 1 2  15.  sitting for 1 hour 1 2  16. Running on even ground 2 0  17. Running on uneven ground 1 0  18. Making sharp turns while running fast 0 1  19. Hopping  0 2  20. Rolling over in bed 3 2  Score total:  44/80 36/80     THE PATIENT SPECIFIC FUNCTIONAL SCALE  Place score of 0-10 (0 = unable to perform activity and 10 = able to perform activity at the same level as before injury or problem)  Activity  Date: 09/22/24    Long Distance Driving (~2 hours) 5    2.     3.     4.      Total Score 5      Total Score = Sum of activity scores/number of activities  Minimally Detectable Change: 3 points (for single activity); 2 points (for average score)  [facility] (nd). The Patient Specific Functional Scale . Retrieved from [email or web address]   COGNITION: Overall cognitive status: Within functional limits for tasks assessed     SENSATION: Not tested  LOWER EXTREMITY MMT:  MMT Right eval Left eval Right  11/16/2024 Left 11/16/2024  Hip flexion 4- 4 4- 4  Hip extension 3 3+ 3 3+  Hip abduction 3 4- 3  4-  Hip adduction      Hip internal rotation 4- 4 4- 4  Hip external rotation 3+ 4- 3+ 4-  Knee flexion 4 4 4 4   Knee extension 3+ 4- 3+ 4-  Ankle dorsiflexion 4 4 4 4   Ankle plantarflexion      Ankle inversion      Ankle eversion       (Blank rows = not tested)   LOWER EXTREMITY SPECIAL TESTS:  Hip special tests: deferred  FUNCTIONAL TESTS:  Deferred    Balance  09/22/24  Romberg EO on floor  30s  Romberg EC on floor 30s, mod sway  Romberg EO on airex   Romberg EC on airex     Balance Right 09/22/24 Left 09/22/24   Tandem on floor 20s 20s  SLS on floor      DGI Mild impairment: walks 20', uses assistive devices, slower speed, mild gait deviations. (2) Normal: Able to smoothly change walking speed without loss of balance or gait deviation. Shows significant difference in walking speeds between normal, fast and slow paces. (3) Moderate impairment: Performs head turns with moderate change in gait velocity, slows down, staggers, but recovers, can continue to walk. (1) Moderate impairment: Performs head turns with moderate change in gait velocity, slows down, staggers, but recovers, can continue to walk. (1) Mild impairment: pivot turns safely in >3 seconds and stops with no loss of balance.  (2) Mild impairment: Is able to step over shoe box, but must slow down and adjust steps to clear box safely. (2) Normal: Is able to walk safely around cones safely without changing gait speed; no evidence of imbalance. (3) Moderate impairment: Two feet to a stair, must use rail. (1) Total Score: 15/24 Scores below 19 suggest that the subject assessed has a higher risk of prospective falls                                                                                                                                TREATMENT DATE: [facility] Adult PT Treatment:                                                DATE: 11/24/24 Therapeutic Exercise: Elliptical x 5 mins Knee extension 10# 2x10 Toes raises back against wall 2x10 Palloff press 7# 2x10 BIL Neuromuscular re-ed: Brion marc Bridge with heels on pball legs extended 2x10 Therapeutic Activity: Mini squats with UE support 2x10 Side stepping at counter RTB at ankles x 4 laps Pre-STS rock with glute activation x10  [facility] Adult PT Treatment:  DATE: 11/16/24 Therapeutic Activity: Tests and measures for progress note Neuro Re-Ed: Russian estim 10/50, 50mA, gluteal sets, bridge, bridge with knees extended x 20 mins   [facility] Adult PT Treatment:                                                DATE: 10/27/24 Therapeutic Exercise: NuStep level 5 x 8 mins Seated knee extension 10lbs x 10, 5lbs x 10   Seated hamstring curl 15lbs x 10, 20lbs x 10  SLS clock x 4 B  Neuromuscular re-ed: Obstacle course with hurdles, foam, balance beam, bosu ball, wobble board x 20 mins    PATIENT EDUCATION:  Education details: reviewed initial home exercise program; discussion of POC, prognosis and goals for skilled PT   Person educated: Patient Education method: Explanation, Demonstration, and Handouts Education comprehension: verbalized understanding, returned demonstration, and needs further education  HOME EXERCISE  PROGRAM: Access Code: [email or web address] URL: [email or web address] Date: 09/22/2024 Prepared by: Marko Molt  Exercises - Clamshell  - 1 x daily - 4 x weekly - 2 sets - 10 reps - Supine Active Straight Leg Raise  - 1 x daily - 4 x weekly - 2 sets - 10 reps - Supine Bridge  - 1 x daily - 4 x weekly - 2 sets - 10 reps - Supine Hip Adduction Isometric with Ball  - 1 x daily - 4 x weekly - 2 sets - 10 reps - 3 sec hold - Seated Long Arc Quad  - 1 x daily - 4 x weekly - 2 sets - 10 reps - 3 sec hold - Heel Toe Raises with Counter Support  - 1 x daily - 4 x weekly - 2 sets - 10 reps  ASSESSMENT:  CLINICAL IMPRESSION: Patient reports that her hip continues to bother her, has been feeling generally unwell lately. Session today continued to focus on lateral hip and glute activation. She continues to have difficulty noting if glutes are activating though she does report muscular fatigue with  Patient continues to benefit from skilled PT services and should be progressed as able to improve functional independence.   Progress Note: Pt has been seen for 12 PT visits to address her hip pain. She demonstrates no significant improvement subjectively or objectively. She continues to have pain in the hip and balance deficits. She has met 1 out of 2 short term goals. The pt wishes to finish her final 4 visits and scheduling with her referring provider in the meantime.  Patient will benefit from skilled PT in order to increase functional independence and hip strength and mobility.   Eval: Teliyah is a 48 y.o. female who was seen today for physical therapy evaluation and treatment for persisted R hip pain with strength deficits. She has decreased MMT scores BIL. She is demonstrating diminished static standing balance with narrow BOS. Dynamic balance to be further assessed and f/u visit. She has related pain and difficulty with standing, walking, driving, and sitting with legs crossed. She requires skilled  PT services at this time to address relevant deficits and improve overall function.     OBJECTIVE IMPAIRMENTS: decreased activity tolerance, decreased balance, decreased endurance, decreased strength, and pain.   ACTIVITY LIMITATIONS: carrying, sitting, standing, stairs, and locomotion level  PARTICIPATION LIMITATIONS: cleaning, interpersonal relationship, driving, shopping, community activity, and occupation  PERSONAL FACTORS: Time since onset  of injury/illness/exacerbation and 3+ comorbidities: Relevant PMHx includes fibromyalgia, scoliosis, peripheral neuropathy, BIL knee pain, vitamin D  deficiency are also affecting patient's functional outcome.   REHAB POTENTIAL: Fair    CLINICAL DECISION MAKING: Evolving/moderate complexity  EVALUATION COMPLEXITY: Moderate   GOALS: Goals reviewed with patient? YES  SHORT TERM GOALS: Target date: 10/20/2024   Patient will be independent with initial home program at least 3 days/week.  Baseline: provided at eval Goal Status: MET  2.  Patient will demonstrate improved BIL LE MMT scores to at least 4+/5.  Baseline:  Goal Status: PROGRESSING    LONG TERM GOALS: Target date: 12/07/2024   Patient will report improved overall functional ability with LEFS score of 60/80 or greater.  Baseline: 44/80 11/16/2024: 36/80 Goal Status: PROGRESSING    2.  Patient will report 7/10 or greater PSFS score for long distance driving.  Baseline: 5/10 Goal status: INITIAL  3.  Patient will have 20/24 on DGI assessment in order to demonstrate decreased fall risk.  Baseline: to be assessed at f/u visit Goal status: INITIAL    PLAN:  PT FREQUENCY: 1-2x/week  PT DURATION: 3 weeks  PLANNED INTERVENTIONS: 97164- PT Re-evaluation, 97750- Physical Performance Testing, 97110-Therapeutic exercises, 97530- Therapeutic activity, 97112- Neuromuscular re-education, 97535- Self Care, [id number]- Manual therapy, [phone]- Aquatic Therapy, [id number]- Electrical  stimulation (unattended), [phone]- Electrical stimulation (manual), 20560 (1-2 muscles), 20561 (3+ muscles)- Dry Needling, Patient/Family education, Balance training, Taping, Joint mobilization, Cryotherapy, and Moist heat  PLAN FOR NEXT SESSION: quadriceps and gluteal activation (high volume)   Corean Pouch PTA  11/24/2024 2:38 PM   "

## 2024-11-30 ENCOUNTER — Ambulatory Visit: Payer: Self-pay | Attending: Internal Medicine

## 2024-11-30 DIAGNOSIS — M25551 Pain in right hip: Secondary | ICD-10-CM | POA: Insufficient documentation

## 2024-11-30 NOTE — Therapy (Signed)
 " OUTPATIENT PHYSICAL THERAPY TREATMENT NOTE   Patient Name: Sarah [name] MRN: [id number] DOB:26-Apr-1976, 49 y.o., female Today's Date: 11/30/2024  END OF SESSION:  PT End of Session - 11/30/24 0903     Visit Number 14    Number of Visits 16    Date for Recertification  12/07/24    Authorization Type BCBS    PT Start Time 0915    PT Stop Time 0955    PT Time Calculation (min) 40 min    Activity Tolerance Patient tolerated treatment well    Behavior During Therapy [facility] for tasks assessed/performed         Past Medical History:  Diagnosis Date   Carpal tunnel syndrome    Fibromyalgia    GERD (gastroesophageal reflux disease)    Hernia, hiatal    Hypercalcemia 01/06/2021   Left breast mass 01/26/2021   Liver cyst    Malaise 01/06/2021   Need for tetanus, diphtheria, and acellular pertussis (Tdap) vaccine 01/06/2021   Palpitations 01/26/2021   Polyuria 01/06/2021   Scoliosis    Weight loss 01/02/2021   Past Surgical History:  Procedure Laterality Date   CARPAL TUNNEL RELEASE Right 07/01/2023   Procedure: right carpal tunnel release;  Surgeon: Murrell Drivers, MD;  Location: [facility];  Service: Orthopedics;  Laterality: Right;   CARPAL TUNNEL RELEASE Left 11/11/2023   Procedure: LEFT CARPAL TUNNEL RELEASE;  Surgeon: Murrell Drivers, MD;  Location: [facility];  Service: Orthopedics;  Laterality: Left;  30 MIN   CERVICAL SPINE SURGERY     Left fooscrew and plate [id number]      TUBAL LIGATION     Patient Active Problem List   Diagnosis Date Noted   Peripheral neuropathy 03/12/2024   Bilateral knee pain 08/23/2023   Flank pain 03/18/2023   Family history of elevated blood lipids 03/18/2023   Screening for colon cancer 09/10/2022   Gastroesophageal reflux disease without esophagitis 09/10/2022   Needs flu shot 09/10/2022   Vitamin D  deficiency 08/13/2022   Pain in right hip 08/13/2022   Facial rash 02/08/2022   Fibromyalgia 11/09/2021   Fibromyalgia  syndrome 11/09/2021   Positive ANA (antinuclear antibody) 10/12/2021   Impingement syndrome of right shoulder 07/07/2021   Labral tear of shoulder, degenerative, right 07/07/2021   Carpal tunnel syndrome, bilateral 07/07/2021   DDD (degenerative disc disease), cervical 07/07/2021   Other fatigue 05/08/2021   Leg cramps 05/08/2021   Frequency of urination 05/08/2021   Irregular menses 05/08/2021   Scoliosis    Palpitations 01/26/2021   Left breast mass 01/26/2021   Polyuria 01/06/2021   Hypercalcemia 01/06/2021   Malaise 01/06/2021   Need for tetanus, diphtheria, and acellular pertussis (Tdap) vaccine 01/06/2021   Weight loss 01/02/2021   Hyperglycemia 01/02/2021    PCP: Nicholaus Credit, PA-C  REFERRING PROVIDER:  Jeannetta Lonni ORN, MD     REFERRING DIAG:  M77.9 (ICD-10-CM) - Tendinitis  M70.71 (ICD-10-CM) - Bursitis of right hip, unspecified bursa  M25.551 (ICD-10-CM) - Pain in right hip    THERAPY DIAG:  Pain in right hip  Rationale for Evaluation and Treatment: Rehabilitation  ONSET DATE: >6 months   SUBJECTIVE:   SUBJECTIVE STATEMENT: Patient reports that she has a headache today, has been very tired lately. She had some soreness after previous session.   Eval: Patient reports to PT d/t R hip pain that has been present for >6 months. She does not recall any hip related injuries. She notes that she  has started to notice her R foot rolling in (pronation) with any standing. She states that this has already been happening on her L foot for years. She has history of knee pain. Her understanding is that she has some rotation and bone spurs on R side.     PERTINENT HISTORY: Relevant PMHx includes fibromyalgia, scoliosis, peripheral neuropathy, BIL knee pain, vitamin D  deficiency  PAIN:  Are you having pain? Yes: NPRS scale: 3-4/10 current, 5/10 worst  Pain location: R hip and thigh  Pain description: sore Aggravating factors: prolonged standing, driving, sitting with  legs crossed on ground Relieving factors: changing positions  PRECAUTIONS: None  RED FLAGS: None   WEIGHT BEARING RESTRICTIONS: No  FALLS:  Has patient fallen in last 6 months? Yes. Number of falls >3  LIVING ENVIRONMENT: Lives with: lives with their family Lives in: House/apartment Stairs: No Has following equipment at home: None  OCCUPATION: unemployed  PLOF: Independent  PATIENT GOALS: to have less pain in R hip with daily activities including standing, walking, prolonged sitting   NEXT MD VISIT: multi provider follow up   OBJECTIVE:  Note: Objective measures were completed at Evaluation unless otherwise noted.  DIAGNOSTIC FINDINGS:   Per Dr. Jeannetta, Hip xray looked good with no significant osteoarthritis or structural damage.  PATIENT SURVEYS:  LEFS  Extreme difficulty/unable (0), Quite a bit of difficulty (1), Moderate difficulty (2), Little difficulty (3), No difficulty (4) Survey date:  09/22/2024  11/16/2024  Any of your usual work, housework or school activities 2 2  2. Usual hobbies, recreational or sporting activities 1 2  3. Getting into/out of the bath 3 2  4. Walking between rooms 4 3  5. Putting on socks/shoes 4 3  6. Squatting  2 1  7. Lifting an object, like a bag of groceries from the floor 4 3  8. Performing light activities around your home 4 3  9. Performing heavy activities around your home 4 1   10. Getting into/out of a car 2 2  11. Walking 2 blocks 2 2  12. Walking 1 mile 2 1  13. Going up/down 10 stairs (1 flight) 2 2  14. Standing for 1 hour 1 2  15.  sitting for 1 hour 1 2  16. Running on even ground 2 0  17. Running on uneven ground 1 0  18. Making sharp turns while running fast 0 1  19. Hopping  0 2  20. Rolling over in bed 3 2  Score total:  44/80 36/80     THE PATIENT SPECIFIC FUNCTIONAL SCALE  Place score of 0-10 (0 = unable to perform activity and 10 = able to perform activity at the same level as before injury or  problem)  Activity Date: 09/22/24    Long Distance Driving (~2 hours) 5    2.     3.     4.      Total Score 5      Total Score = Sum of activity scores/number of activities  Minimally Detectable Change: 3 points (for single activity); 2 points (for average score)  [facility] (nd). The Patient Specific Functional Scale . Retrieved from [email or web address]   COGNITION: Overall cognitive status: Within functional limits for tasks assessed     SENSATION: Not tested  LOWER EXTREMITY MMT:  MMT Right eval Left eval Right  11/16/2024 Left 11/16/2024  Hip flexion 4- 4 4- 4  Hip extension 3 3+ 3 3+  Hip abduction  3 4- 3 4-  Hip adduction      Hip internal rotation 4- 4 4- 4  Hip external rotation 3+ 4- 3+ 4-  Knee flexion 4 4 4 4   Knee extension 3+ 4- 3+ 4-  Ankle dorsiflexion 4 4 4 4   Ankle plantarflexion      Ankle inversion      Ankle eversion       (Blank rows = not tested)   LOWER EXTREMITY SPECIAL TESTS:  Hip special tests: deferred  FUNCTIONAL TESTS:  Deferred    Balance  09/22/24  Romberg EO on floor  30s  Romberg EC on floor 30s, mod sway  Romberg EO on airex   Romberg EC on airex     Balance Right 09/22/24 Left 09/22/24   Tandem on floor 20s 20s  SLS on floor      DGI Mild impairment: walks 20', uses assistive devices, slower speed, mild gait deviations. (2) Normal: Able to smoothly change walking speed without loss of balance or gait deviation. Shows significant difference in walking speeds between normal, fast and slow paces. (3) Moderate impairment: Performs head turns with moderate change in gait velocity, slows down, staggers, but recovers, can continue to walk. (1) Moderate impairment: Performs head turns with moderate change in gait velocity, slows down, staggers, but recovers, can continue to walk. (1) Mild impairment: pivot turns safely in >3 seconds and stops with no  loss of balance. (2) Mild impairment: Is able to step over shoe box, but must slow down and adjust steps to clear box safely. (2) Normal: Is able to walk safely around cones safely without changing gait speed; no evidence of imbalance. (3) Moderate impairment: Two feet to a stair, must use rail. (1) Total Score: 15/24 Scores below 19 suggest that the subject assessed has a higher risk of prospective falls                                                                                                                                TREATMENT DATE: [facility] Adult PT Treatment:                                                DATE: 11/30/24 Therapeutic Exercise: Elliptical x 5 mins Knee extension 10# 2x10 Toes raises back against wall 2x10 Palloff press 7# 2x10 BIL Neuromuscular re-ed: Bridge 2x10 Bridge with heels on pball legs extended 2x10 Therapeutic Activity: Mini squats with UE support 2x10 Side stepping at counter RTB at ankles x 4 laps Pre-STS rock with glute activation 2x10  [facility] Adult PT Treatment:  DATE: 11/24/24 Therapeutic Exercise: Elliptical x 5 mins Knee extension 10# 2x10 Toes raises back against wall 2x10 Palloff press 7# 2x10 BIL Neuromuscular re-ed: Brion marc Bridge with heels on pball legs extended 2x10 Therapeutic Activity: Mini squats with UE support 2x10 Side stepping at counter RTB at ankles x 4 laps Pre-STS rock with glute activation x10  [facility] Adult PT Treatment:                                                DATE: 11/16/24 Therapeutic Activity: Tests and measures for progress note Neuro Re-Ed: Russian estim 10/50, 50mA, gluteal sets, bridge, bridge with knees extended x 20 mins    PATIENT EDUCATION:  Education details: reviewed initial home exercise program; discussion of POC, prognosis and goals for skilled PT   Person educated: Patient Education method: Explanation, Demonstration, and Handouts Education  comprehension: verbalized understanding, returned demonstration, and needs further education  HOME EXERCISE PROGRAM: Access Code: [email or web address] URL: [email or web address] Date: 09/22/2024 Prepared by: Marko Molt  Exercises - Clamshell  - 1 x daily - 4 x weekly - 2 sets - 10 reps - Supine Active Straight Leg Raise  - 1 x daily - 4 x weekly - 2 sets - 10 reps - Supine Bridge  - 1 x daily - 4 x weekly - 2 sets - 10 reps - Supine Hip Adduction Isometric with Ball  - 1 x daily - 4 x weekly - 2 sets - 10 reps - 3 sec hold - Seated Long Arc Quad  - 1 x daily - 4 x weekly - 2 sets - 10 reps - 3 sec hold - Heel Toe Raises with Counter Support  - 1 x daily - 4 x weekly - 2 sets - 10 reps  ASSESSMENT:  CLINICAL IMPRESSION: Patient presents to PT reporting that she had some soreness after previous session and that she has a headache today.   Patient reports that her hip continues to bother her, has been feeling generally unwell lately. Session today continued to focus on lateral hip and glute activation. She continues to have difficulty noting if glutes are activating though she does report muscular fatigue with  Patient continues to benefit from skilled PT services and should be progressed as able to improve functional independence.   Progress Note: Pt has been seen for 12 PT visits to address her hip pain. She demonstrates no significant improvement subjectively or objectively. She continues to have pain in the hip and balance deficits. She has met 1 out of 2 short term goals. The pt wishes to finish her final 4 visits and scheduling with her referring provider in the meantime.  Patient will benefit from skilled PT in order to increase functional independence and hip strength and mobility.   Eval: Neelie is a 49 y.o. female who was seen today for physical therapy evaluation and treatment for persisted R hip pain with strength deficits. She has decreased MMT scores BIL. She is  demonstrating diminished static standing balance with narrow BOS. Dynamic balance to be further assessed and f/u visit. She has related pain and difficulty with standing, walking, driving, and sitting with legs crossed. She requires skilled PT services at this time to address relevant deficits and improve overall function.     OBJECTIVE IMPAIRMENTS: decreased activity tolerance, decreased balance, decreased endurance, decreased strength, and pain.  ACTIVITY LIMITATIONS: carrying, sitting, standing, stairs, and locomotion level  PARTICIPATION LIMITATIONS: cleaning, interpersonal relationship, driving, shopping, community activity, and occupation  PERSONAL FACTORS: Time since onset of injury/illness/exacerbation and 3+ comorbidities: Relevant PMHx includes fibromyalgia, scoliosis, peripheral neuropathy, BIL knee pain, vitamin D  deficiency are also affecting patient's functional outcome.   REHAB POTENTIAL: Fair    CLINICAL DECISION MAKING: Evolving/moderate complexity  EVALUATION COMPLEXITY: Moderate   GOALS: Goals reviewed with patient? YES  SHORT TERM GOALS: Target date: 10/20/2024   Patient will be independent with initial home program at least 3 days/week.  Baseline: provided at eval Goal Status: MET  2.  Patient will demonstrate improved BIL LE MMT scores to at least 4+/5.  Baseline:  Goal Status: PROGRESSING    LONG TERM GOALS: Target date: 12/07/2024   Patient will report improved overall functional ability with LEFS score of 60/80 or greater.  Baseline: 44/80 11/16/2024: 36/80 Goal Status: PROGRESSING    2.  Patient will report 7/10 or greater PSFS score for long distance driving.  Baseline: 5/10 Goal status: INITIAL  3.  Patient will have 20/24 on DGI assessment in order to demonstrate decreased fall risk.  Baseline: to be assessed at f/u visit Goal status: INITIAL    PLAN:  PT FREQUENCY: 1-2x/week  PT DURATION: 3 weeks  PLANNED INTERVENTIONS: [id number]-  PT Re-evaluation, 97750- Physical Performance Testing, 97110-Therapeutic exercises, 97530- Therapeutic activity, 97112- Neuromuscular re-education, 97535- Self Care, [id number]- Manual therapy, [phone]- Aquatic Therapy, [id number]- Electrical stimulation (unattended), [phone]- Electrical stimulation (manual), 20560 (1-2 muscles), 20561 (3+ muscles)- Dry Needling, Patient/Family education, Balance training, Taping, Joint mobilization, Cryotherapy, and Moist heat  PLAN FOR NEXT SESSION: quadriceps and gluteal activation (high volume)   Corean Pouch PTA  11/30/2024 9:56 AM   "

## 2024-12-02 ENCOUNTER — Ambulatory Visit

## 2024-12-02 ENCOUNTER — Telehealth: Payer: Self-pay

## 2024-12-02 DIAGNOSIS — Z111 Encounter for screening for respiratory tuberculosis: Secondary | ICD-10-CM | POA: Diagnosis not present

## 2024-12-02 DIAGNOSIS — M25551 Pain in right hip: Secondary | ICD-10-CM

## 2024-12-02 NOTE — Therapy (Signed)
 " OUTPATIENT PHYSICAL THERAPY TREATMENT NOTE   Patient Name: Sarah [name] MRN: [id number] DOB:August 13, 1976, 49 y.o., female Today's Date: 12/02/2024  END OF SESSION:  PT End of Session - 12/02/24 0858     Visit Number 15    Number of Visits 16    Date for Recertification  12/07/24    Authorization Type BCBS    PT Start Time 0900    PT Stop Time 0940    PT Time Calculation (min) 40 min    Activity Tolerance Patient tolerated treatment well    Behavior During Therapy [facility] for tasks assessed/performed          Past Medical History:  Diagnosis Date   Carpal tunnel syndrome    Fibromyalgia    GERD (gastroesophageal reflux disease)    Hernia, hiatal    Hypercalcemia 01/06/2021   Left breast mass 01/26/2021   Liver cyst    Malaise 01/06/2021   Need for tetanus, diphtheria, and acellular pertussis (Tdap) vaccine 01/06/2021   Palpitations 01/26/2021   Polyuria 01/06/2021   Scoliosis    Weight loss 01/02/2021   Past Surgical History:  Procedure Laterality Date   CARPAL TUNNEL RELEASE Right 07/01/2023   Procedure: right carpal tunnel release;  Surgeon: Murrell Drivers, MD;  Location: [facility];  Service: Orthopedics;  Laterality: Right;   CARPAL TUNNEL RELEASE Left 11/11/2023   Procedure: LEFT CARPAL TUNNEL RELEASE;  Surgeon: Murrell Drivers, MD;  Location: [facility];  Service: Orthopedics;  Laterality: Left;  30 MIN   CERVICAL SPINE SURGERY     Left fooscrew and plate [id number]      TUBAL LIGATION     Patient Active Problem List   Diagnosis Date Noted   Peripheral neuropathy 03/12/2024   Bilateral knee pain 08/23/2023   Flank pain 03/18/2023   Family history of elevated blood lipids 03/18/2023   Screening for colon cancer 09/10/2022   Gastroesophageal reflux disease without esophagitis 09/10/2022   Needs flu shot 09/10/2022   Vitamin D  deficiency 08/13/2022   Pain in right hip 08/13/2022   Facial rash 02/08/2022   Fibromyalgia 11/09/2021    Fibromyalgia syndrome 11/09/2021   Positive ANA (antinuclear antibody) 10/12/2021   Impingement syndrome of right shoulder 07/07/2021   Labral tear of shoulder, degenerative, right 07/07/2021   Carpal tunnel syndrome, bilateral 07/07/2021   DDD (degenerative disc disease), cervical 07/07/2021   Other fatigue 05/08/2021   Leg cramps 05/08/2021   Frequency of urination 05/08/2021   Irregular menses 05/08/2021   Scoliosis    Palpitations 01/26/2021   Left breast mass 01/26/2021   Polyuria 01/06/2021   Hypercalcemia 01/06/2021   Malaise 01/06/2021   Need for tetanus, diphtheria, and acellular pertussis (Tdap) vaccine 01/06/2021   Weight loss 01/02/2021   Hyperglycemia 01/02/2021    PCP: Nicholaus Credit, PA-C  REFERRING PROVIDER:  Jeannetta Lonni ORN, MD     REFERRING DIAG:  M77.9 (ICD-10-CM) - Tendinitis  M70.71 (ICD-10-CM) - Bursitis of right hip, unspecified bursa  M25.551 (ICD-10-CM) - Pain in right hip    THERAPY DIAG:  Pain in right hip  Rationale for Evaluation and Treatment: Rehabilitation  ONSET DATE: >6 months   SUBJECTIVE:   SUBJECTIVE STATEMENT: Patient reports mild soreness from last session. She is agreeable to d/c next session.   Eval: Patient reports to PT d/t R hip pain that has been present for >6 months. She does not recall any hip related injuries. She notes that she has started to notice her  R foot rolling in (pronation) with any standing. She states that this has already been happening on her L foot for years. She has history of knee pain. Her understanding is that she has some rotation and bone spurs on R side.     PERTINENT HISTORY: Relevant PMHx includes fibromyalgia, scoliosis, peripheral neuropathy, BIL knee pain, vitamin D  deficiency  PAIN:  Are you having pain? Yes: NPRS scale: 3-4/10 current, 5/10 worst  Pain location: R hip and thigh  Pain description: sore Aggravating factors: prolonged standing, driving, sitting with legs crossed on  ground Relieving factors: changing positions  PRECAUTIONS: None  RED FLAGS: None   WEIGHT BEARING RESTRICTIONS: No  FALLS:  Has patient fallen in last 6 months? Yes. Number of falls >3  LIVING ENVIRONMENT: Lives with: lives with their family Lives in: House/apartment Stairs: No Has following equipment at home: None  OCCUPATION: unemployed  PLOF: Independent  PATIENT GOALS: to have less pain in R hip with daily activities including standing, walking, prolonged sitting   NEXT MD VISIT: multi provider follow up   OBJECTIVE:  Note: Objective measures were completed at Evaluation unless otherwise noted.  DIAGNOSTIC FINDINGS:   Per Dr. Jeannetta, Hip xray looked good with no significant osteoarthritis or structural damage.  PATIENT SURVEYS:  LEFS  Extreme difficulty/unable (0), Quite a bit of difficulty (1), Moderate difficulty (2), Little difficulty (3), No difficulty (4) Survey date:  09/22/2024  11/16/2024  Any of your usual work, housework or school activities 2 2  2. Usual hobbies, recreational or sporting activities 1 2  3. Getting into/out of the bath 3 2  4. Walking between rooms 4 3  5. Putting on socks/shoes 4 3  6. Squatting  2 1  7. Lifting an object, like a bag of groceries from the floor 4 3  8. Performing light activities around your home 4 3  9. Performing heavy activities around your home 4 1   10. Getting into/out of a car 2 2  11. Walking 2 blocks 2 2  12. Walking 1 mile 2 1  13. Going up/down 10 stairs (1 flight) 2 2  14. Standing for 1 hour 1 2  15.  sitting for 1 hour 1 2  16. Running on even ground 2 0  17. Running on uneven ground 1 0  18. Making sharp turns while running fast 0 1  19. Hopping  0 2  20. Rolling over in bed 3 2  Score total:  44/80 36/80     THE PATIENT SPECIFIC FUNCTIONAL SCALE  Place score of 0-10 (0 = unable to perform activity and 10 = able to perform activity at the same level as before injury or problem)  Activity  Date: 09/22/24    Long Distance Driving (~2 hours) 5    2.     3.     4.      Total Score 5      Total Score = Sum of activity scores/number of activities  Minimally Detectable Change: 3 points (for single activity); 2 points (for average score)  [facility] (nd). The Patient Specific Functional Scale . Retrieved from [email or web address]   COGNITION: Overall cognitive status: Within functional limits for tasks assessed     SENSATION: Not tested  LOWER EXTREMITY MMT:  MMT Right eval Left eval Right  11/16/2024 Left 11/16/2024  Hip flexion 4- 4 4- 4  Hip extension 3 3+ 3 3+  Hip abduction 3 4- 3 4-  Hip adduction      Hip internal rotation 4- 4 4- 4  Hip external rotation 3+ 4- 3+ 4-  Knee flexion 4 4 4 4   Knee extension 3+ 4- 3+ 4-  Ankle dorsiflexion 4 4 4 4   Ankle plantarflexion      Ankle inversion      Ankle eversion       (Blank rows = not tested)   LOWER EXTREMITY SPECIAL TESTS:  Hip special tests: deferred  FUNCTIONAL TESTS:  Deferred    Balance  09/22/24  Romberg EO on floor  30s  Romberg EC on floor 30s, mod sway  Romberg EO on airex   Romberg EC on airex     Balance Right 09/22/24 Left 09/22/24   Tandem on floor 20s 20s  SLS on floor      DGI Mild impairment: walks 20', uses assistive devices, slower speed, mild gait deviations. (2) Normal: Able to smoothly change walking speed without loss of balance or gait deviation. Shows significant difference in walking speeds between normal, fast and slow paces. (3) Moderate impairment: Performs head turns with moderate change in gait velocity, slows down, staggers, but recovers, can continue to walk. (1) Moderate impairment: Performs head turns with moderate change in gait velocity, slows down, staggers, but recovers, can continue to walk. (1) Mild impairment: pivot turns safely in >3 seconds and stops with no loss of balance.  (2) Mild impairment: Is able to step over shoe box, but must slow down and adjust steps to clear box safely. (2) Normal: Is able to walk safely around cones safely without changing gait speed; no evidence of imbalance. (3) Moderate impairment: Two feet to a stair, must use rail. (1) Total Score: 15/24 Scores below 19 suggest that the subject assessed has a higher risk of prospective falls                                                                                                                                TREATMENT DATE: [facility] Adult PT Treatment:                                                DATE: 12/02/24 Therapeutic Exercise: Elliptical x 5 mins Horizontal leg press 1st plate x 10  Toes raises back against wall 2x10 Palloff press GTB 2x10 BIL Neuromuscular re-ed: Bridge 2x10 Bridge with heels on pball legs extended 2x10 Therapeutic Activity: Mini squats with UE support 2x10 Side stepping at counter RTB at ankles x 6 laps Pre-STS rock with glute activation 2x10  [facility] Adult PT Treatment:  DATE: 11/24/24 Therapeutic Exercise: Elliptical x 5 mins Knee extension 10# 2x10 Toes raises back against wall 2x10 Palloff press 7# 2x10 BIL Neuromuscular re-ed: Brion marc Bridge with heels on pball legs extended 2x10 Therapeutic Activity: Mini squats with UE support 2x10 Side stepping at counter RTB at ankles x 4 laps Pre-STS rock with glute activation x10  [facility] Adult PT Treatment:                                                DATE: 11/16/24 Therapeutic Activity: Tests and measures for progress note Neuro Re-Ed: Russian estim 10/50, 50mA, gluteal sets, bridge, bridge with knees extended x 20 mins    PATIENT EDUCATION:  Education details: reviewed initial home exercise program; discussion of POC, prognosis and goals for skilled PT   Person educated: Patient Education method: Explanation, Demonstration, and Handouts Education  comprehension: verbalized understanding, returned demonstration, and needs further education  HOME EXERCISE PROGRAM: Access Code: [email or web address] URL: [email or web address] Date: 09/22/2024 Prepared by: Marko Molt  Exercises - Clamshell  - 1 x daily - 4 x weekly - 2 sets - 10 reps - Supine Active Straight Leg Raise  - 1 x daily - 4 x weekly - 2 sets - 10 reps - Supine Bridge  - 1 x daily - 4 x weekly - 2 sets - 10 reps - Supine Hip Adduction Isometric with Ball  - 1 x daily - 4 x weekly - 2 sets - 10 reps - 3 sec hold - Seated Long Arc Quad  - 1 x daily - 4 x weekly - 2 sets - 10 reps - 3 sec hold - Heel Toe Raises with Counter Support  - 1 x daily - 4 x weekly - 2 sets - 10 reps  ASSESSMENT:  CLINICAL IMPRESSION: Continued with previously established exercises to improve gluteal activation, LE strength, and balance. Pt noting quad soreness throughout session so knee extension was switched to leg press. Pt able to complete all exercises without complaints of pain. To be d/c next visit.   Progress Note: Pt has been seen for 12 PT visits to address her hip pain. She demonstrates no significant improvement subjectively or objectively. She continues to have pain in the hip and balance deficits. She has met 1 out of 2 short term goals. The pt wishes to finish her final 4 visits and scheduling with her referring provider in the meantime.  Patient will benefit from skilled PT in order to increase functional independence and hip strength and mobility.   Eval: Liberty is a 49 y.o. female who was seen today for physical therapy evaluation and treatment for persisted R hip pain with strength deficits. She has decreased MMT scores BIL. She is demonstrating diminished static standing balance with narrow BOS. Dynamic balance to be further assessed and f/u visit. She has related pain and difficulty with standing, walking, driving, and sitting with legs crossed. She requires skilled PT services  at this time to address relevant deficits and improve overall function.     OBJECTIVE IMPAIRMENTS: decreased activity tolerance, decreased balance, decreased endurance, decreased strength, and pain.   ACTIVITY LIMITATIONS: carrying, sitting, standing, stairs, and locomotion level  PARTICIPATION LIMITATIONS: cleaning, interpersonal relationship, driving, shopping, community activity, and occupation  PERSONAL FACTORS: Time since onset of injury/illness/exacerbation and 3+ comorbidities: Relevant PMHx includes fibromyalgia, scoliosis, peripheral neuropathy, BIL knee pain,  vitamin D  deficiency are also affecting patient's functional outcome.   REHAB POTENTIAL: Fair    CLINICAL DECISION MAKING: Evolving/moderate complexity  EVALUATION COMPLEXITY: Moderate   GOALS: Goals reviewed with patient? YES  SHORT TERM GOALS: Target date: 10/20/2024   Patient will be independent with initial home program at least 3 days/week.  Baseline: provided at eval Goal Status: MET  2.  Patient will demonstrate improved BIL LE MMT scores to at least 4+/5.  Baseline:  Goal Status: PROGRESSING    LONG TERM GOALS: Target date: 12/07/2024   Patient will report improved overall functional ability with LEFS score of 60/80 or greater.  Baseline: 44/80 11/16/2024: 36/80 Goal Status: PROGRESSING    2.  Patient will report 7/10 or greater PSFS score for long distance driving.  Baseline: 5/10 Goal status: INITIAL  3.  Patient will have 20/24 on DGI assessment in order to demonstrate decreased fall risk.  Baseline: to be assessed at f/u visit Goal status: INITIAL    PLAN:  PT FREQUENCY: 1-2x/week  PT DURATION: 3 weeks  PLANNED INTERVENTIONS: 97164- PT Re-evaluation, 97750- Physical Performance Testing, 97110-Therapeutic exercises, 97530- Therapeutic activity, 97112- Neuromuscular re-education, 97535- Self Care, [id number]- Manual therapy, [phone]- Aquatic Therapy, [id number]- Electrical stimulation  (unattended), [phone]- Electrical stimulation (manual), 20560 (1-2 muscles), 20561 (3+ muscles)- Dry Needling, Patient/Family education, Balance training, Taping, Joint mobilization, Cryotherapy, and Moist heat  PLAN FOR NEXT SESSION: quadriceps and gluteal activation (high volume)   Seanmichael Salmons A Vivian Neuwirth PT  12/02/2024 8:58 AM  "

## 2024-12-02 NOTE — Progress Notes (Signed)
 Patient is in office today for a nurse visit for PPD. Patient Injection was given in the  Right arm. Patient tolerated injection well.  Patient made aware to come back Friday morning for reading.

## 2024-12-02 NOTE — Telephone Encounter (Signed)
 Patient scheduled for TB skin test (job requirement). Aware it will have to be read on Friday before we close at 12.  Copied from CRM #[id number]. Topic: Clinical - Request for Lab/Test Order >> Dec 02, 2024  9:52 AM Maisie BROCKS wrote: Reason for CRM: pt called in to request for the TB Skin test. Please call w/ update by EOD.

## 2024-12-04 ENCOUNTER — Ambulatory Visit

## 2024-12-04 DIAGNOSIS — Z111 Encounter for screening for respiratory tuberculosis: Secondary | ICD-10-CM

## 2024-12-04 LAB — TB SKIN TEST
Induration: 0 mm
TB Skin Test: NEGATIVE

## 2024-12-04 NOTE — Progress Notes (Signed)
 Patient is in office today for a nurse visit for PPD. Patient PPD Reading Note PPD read and results entered in EpicCare. Result: 0 mm induration. Interpretation: Negative If test not read within 48-72 hours of initial placement, patient advised to repeat in other arm 1-3 weeks after this test. Allergic reaction: no

## 2024-12-07 ENCOUNTER — Ambulatory Visit

## 2024-12-07 DIAGNOSIS — M25551 Pain in right hip: Secondary | ICD-10-CM

## 2024-12-07 NOTE — Therapy (Signed)
 " OUTPATIENT PHYSICAL THERAPY TREATMENT NOTE   Patient Name: Sarah [name] MRN: [id number] DOB:11-05-1976, 49 y.o., female Today's Date: 12/07/2024  END OF SESSION:  PT End of Session - 12/07/24 0900     Visit Number 16    Number of Visits 16    Date for Recertification  12/07/24    Authorization Type BC[employer]    PT Start Time 0915    PT Stop Time 0946    PT Time Calculation (min) 31 min    Activity Tolerance Patient tolerated treatment well    Behavior During Therapy [facility] for tasks assessed/performed           Past Medical History:  Diagnosis Date   Carpal tunnel syndrome    Fibromyalgia    GERD (gastroesophageal reflux disease)    Hernia, hiatal    Hypercalcemia 01/06/2021   Left breast mass 01/26/2021   Liver cyst    Malaise 01/06/2021   Need for tetanus, diphtheria, and acellular pertussis (Tdap) vaccine 01/06/2021   Palpitations 01/26/2021   Polyuria 01/06/2021   Scoliosis    Weight loss 01/02/2021   Past Surgical History:  Procedure Laterality Date   CARPAL TUNNEL RELEASE Right 07/01/2023   Procedure: right carpal tunnel release;  Surgeon: Murrell Drivers, MD;  Location: [facility];  Service: Orthopedics;  Laterality: Right;   CARPAL TUNNEL RELEASE Left 11/11/2023   Procedure: LEFT CARPAL TUNNEL RELEASE;  Surgeon: Murrell Drivers, MD;  Location: [facility];  Service: Orthopedics;  Laterality: Left;  30 MIN   CERVICAL SPINE SURGERY     Left fooscrew and plate [id number]      TUBAL LIGATION     Patient Active Problem List   Diagnosis Date Noted   Peripheral neuropathy 03/12/2024   Bilateral knee pain 08/23/2023   Flank pain 03/18/2023   Family history of elevated blood lipids 03/18/2023   Screening for colon cancer 09/10/2022   Gastroesophageal reflux disease without esophagitis 09/10/2022   Needs flu shot 09/10/2022   Vitamin D  deficiency 08/13/2022   Pain in right hip 08/13/2022   Facial rash 02/08/2022   Fibromyalgia 11/09/2021    Fibromyalgia syndrome 11/09/2021   Positive ANA (antinuclear antibody) 10/12/2021   Impingement syndrome of right shoulder 07/07/2021   Labral tear of shoulder, degenerative, right 07/07/2021   Carpal tunnel syndrome, bilateral 07/07/2021   DDD (degenerative disc disease), cervical 07/07/2021   Other fatigue 05/08/2021   Leg cramps 05/08/2021   Frequency of urination 05/08/2021   Irregular menses 05/08/2021   Scoliosis    Palpitations 01/26/2021   Left breast mass 01/26/2021   Polyuria 01/06/2021   Hypercalcemia 01/06/2021   Malaise 01/06/2021   Need for tetanus, diphtheria, and acellular pertussis (Tdap) vaccine 01/06/2021   Weight loss 01/02/2021   Hyperglycemia 01/02/2021    PCP: Nicholaus Credit, PA-C  REFERRING PROVIDER:  Jeannetta Lonni ORN, MD     REFERRING DIAG:  M77.9 (ICD-10-CM) - Tendinitis  M70.71 (ICD-10-CM) - Bursitis of right hip, unspecified bursa  M25.551 (ICD-10-CM) - Pain in right hip    THERAPY DIAG:  Pain in right hip  Rationale for Evaluation and Treatment: Rehabilitation  ONSET DATE: >6 months   SUBJECTIVE:   SUBJECTIVE STATEMENT: Patient reports that she is having soreness today in her right knee and hip. She had a busy weekend that involved a lot of driving so she noticed increased pain from this.   Eval: Patient reports to PT d/t R hip pain that has been present for >  6 months. She does not recall any hip related injuries. She notes that she has started to notice her R foot rolling in (pronation) with any standing. She states that this has already been happening on her L foot for years. She has history of knee pain. Her understanding is that she has some rotation and bone spurs on R side.     PERTINENT HISTORY: Relevant PMHx includes fibromyalgia, scoliosis, peripheral neuropathy, BIL knee pain, vitamin D  deficiency  PAIN:  Are you having pain? Yes: NPRS scale: 3-4/10 current, 5/10 worst  Pain location: R hip and thigh  Pain description:  sore Aggravating factors: prolonged standing, driving, sitting with legs crossed on ground Relieving factors: changing positions  PRECAUTIONS: None  RED FLAGS: None   WEIGHT BEARING RESTRICTIONS: No  FALLS:  Has patient fallen in last 6 months? Yes. Number of falls >3  LIVING ENVIRONMENT: Lives with: lives with their family Lives in: House/apartment Stairs: No Has following equipment at home: None  OCCUPATION: unemployed  PLOF: Independent  PATIENT GOALS: to have less pain in R hip with daily activities including standing, walking, prolonged sitting   NEXT MD VISIT: multi provider follow up   OBJECTIVE:  Note: Objective measures were completed at Evaluation unless otherwise noted.  DIAGNOSTIC FINDINGS:   Per Dr. Jeannetta, Hip xray looked good with no significant osteoarthritis or structural damage.  PATIENT SURVEYS:  LEFS  Extreme difficulty/unable (0), Quite a bit of difficulty (1), Moderate difficulty (2), Little difficulty (3), No difficulty (4) Survey date:  09/22/2024  11/16/2024  Any of your usual work, housework or school activities 2 2  2. Usual hobbies, recreational or sporting activities 1 2  3. Getting into/out of the bath 3 2  4. Walking between rooms 4 3  5. Putting on socks/shoes 4 3  6. Squatting  2 1  7. Lifting an object, like a bag of groceries from the floor 4 3  8. Performing light activities around your home 4 3  9. Performing heavy activities around your home 4 1   10. Getting into/out of a car 2 2  11. Walking 2 blocks 2 2  12. Walking 1 mile 2 1  13. Going up/down 10 stairs (1 flight) 2 2  14. Standing for 1 hour 1 2  15.  sitting for 1 hour 1 2  16. Running on even ground 2 0  17. Running on uneven ground 1 0  18. Making sharp turns while running fast 0 1  19. Hopping  0 2  20. Rolling over in bed 3 2  Score total:  44/80 36/80     THE PATIENT SPECIFIC FUNCTIONAL SCALE  Place score of 0-10 (0 = unable to perform activity and 10 =  able to perform activity at the same level as before injury or problem)  Activity Date: 09/22/24 12/07/24   Long Distance Driving (~2 hours) 5 5   2.     3.     4.      Total Score 5      Total Score = Sum of activity scores/number of activities  Minimally Detectable Change: 3 points (for single activity); 2 points (for average score)  [facility] (nd). The Patient Specific Functional Scale . Retrieved from [email or web address]   COGNITION: Overall cognitive status: Within functional limits for tasks assessed     SENSATION: Not tested  LOWER EXTREMITY MMT:  MMT Right eval Left eval Right  11/16/2024 Left 11/16/2024 Right 12/07/24 Left  12/07/24  Hip flexion 4- 4 4- 4 4- 4-  Hip extension 3 3+ 3 3+    Hip abduction 3 4- 3 4- 4 4  Hip adduction        Hip internal rotation 4- 4 4- 4    Hip external rotation 3+ 4- 3+ 4-    Knee flexion 4 4 4 4 4 4   Knee extension 3+ 4- 3+ 4- 4 4  Ankle dorsiflexion 4 4 4 4     Ankle plantarflexion        Ankle inversion        Ankle eversion         (Blank rows = not tested)   LOWER EXTREMITY SPECIAL TESTS:  Hip special tests: deferred  FUNCTIONAL TESTS:  Deferred    Balance  09/22/24  Romberg EO on floor  30s  Romberg EC on floor 30s, mod sway  Romberg EO on airex   Romberg EC on airex     Balance Right 09/22/24 Left 09/22/24   Tandem on floor 20s 20s  SLS on floor      DGI Mild impairment: walks 20', uses assistive devices, slower speed, mild gait deviations. (2) Normal: Able to smoothly change walking speed without loss of balance or gait deviation. Shows significant difference in walking speeds between normal, fast and slow paces. (3) Moderate impairment: Performs head turns with moderate change in gait velocity, slows down, staggers, but recovers, can continue to walk. (1) Moderate impairment: Performs head turns with moderate change in gait velocity,  slows down, staggers, but recovers, can continue to walk. (1) Mild impairment: pivot turns safely in >3 seconds and stops with no loss of balance. (2) Mild impairment: Is able to step over shoe box, but must slow down and adjust steps to clear box safely. (2) Normal: Is able to walk safely around cones safely without changing gait speed; no evidence of imbalance. (3) Moderate impairment: Two feet to a stair, must use rail. (1) Total Score: 15/24 Scores below 19 suggest that the subject assessed has a higher risk of prospective falls   12/07/24  12/07/24 0001  Balance  Balance Assessed Yes  Standardized Balance Assessment  Standardized Balance Assessment Dynamic Gait Index  Dynamic Gait Index  Level Surface 3  Change in Gait Speed 2  Gait with Horizontal Head Turns 2  Gait with Vertical Head Turns 1  Gait and Pivot Turn 2  Step Over Obstacle 2  Step Around Obstacles 3  Steps 1  Total Score 16  Functional Gait  Assessment  Gait assessed  No  Total Score: 16/24 Scores below 19 suggest that the subject assessed has a higher risk of prospective falls                                                                                                                                TREATMENT DATE: [facility] Adult PT Treatment:  DATE: 12/07/24 Therapeutic Exercise: Elliptical x 5 mins Therapeutic Activity: Readministration of MMT, LEFS, PSFS, DGI Update and review of HEP Discussion of course of PT, continuing HEP, returning to PT if needed  [facility] Adult PT Treatment:                                                DATE: 12/02/24 Therapeutic Exercise: Elliptical x 5 mins Horizontal leg press 1st plate x 10  Toes raises back against wall 2x10 Palloff press GTB 2x10 BIL Neuromuscular re-ed: Bridge 2x10 Bridge with heels on pball legs extended 2x10 Therapeutic Activity: Mini squats with UE support 2x10 Side stepping at counter RTB at ankles x 6  laps Pre-STS rock with glute activation 2x10  [facility] Adult PT Treatment:                                                DATE: 11/24/24 Therapeutic Exercise: Elliptical x 5 mins Knee extension 10# 2x10 Toes raises back against wall 2x10 Palloff press 7# 2x10 BIL Neuromuscular re-ed: Bridge x15 Bridge with heels on pball legs extended 2x10 Therapeutic Activity: Mini squats with UE support 2x10 Side stepping at counter RTB at ankles x 4 laps Pre-STS rock with glute activation x10   PATIENT EDUCATION:  Education details: reviewed initial home exercise program; discussion of POC, prognosis and goals for skilled PT   Person educated: Patient Education method: Explanation, Demonstration, and Handouts Education comprehension: verbalized understanding, returned demonstration, and needs further education  HOME EXERCISE PROGRAM: Access Code: [email or web address] URL: [email or web address] Date: 12/07/2024 Prepared by: Corean Pouch  Exercises - Clamshell  - 1 x daily - 4 x weekly - 2 sets - 10 reps - Supine Active Straight Leg Raise  - 1 x daily - 4 x weekly - 2 sets - 10 reps - Supine Bridge  - 1 x daily - 4 x weekly - 2 sets - 10 reps - Supine Hip Adduction Isometric with Ball  - 1 x daily - 4 x weekly - 2 sets - 10 reps - 3 sec hold - Seated Long Arc Quad  - 1 x daily - 4 x weekly - 2 sets - 10 reps - 3 sec hold - Heel Toe Raises with Counter Support  - 1 x daily - 4 x weekly - 2 sets - 10 reps - Supine Gluteal Sets  - 1 x daily - 4 x weekly - 2 sets - 10 reps - Mini Squat with Counter Support  - 1 x daily - 4 x weekly - 2 sets - 10 reps - Side Stepping with Resistance at Ankles and Counter Support  - 1 x daily - 4 x weekly  ASSESSMENT:  CLINICAL IMPRESSION: Patient presents to PT reporting soreness in her hip and knee today. At this time, she continues to demonstrate no significant improvements in her pain or function subjectively or objectively. She remains limited by pain  in her hip and balance deficits. Patient is agreeable to DC today to return to MD for further workup. Provided with HEP patient can continue independently.   Progress Note: Pt has been seen for 12 PT visits to address her hip pain. She demonstrates no significant improvement subjectively or objectively. She continues  to have pain in the hip and balance deficits. She has met 1 out of 2 short term goals. The pt wishes to finish her final 4 visits and scheduling with her referring provider in the meantime.  Patient will benefit from skilled PT in order to increase functional independence and hip strength and mobility.   Eval: Ennifer is a 49 y.o. female who was seen today for physical therapy evaluation and treatment for persisted R hip pain with strength deficits. She has decreased MMT scores BIL. She is demonstrating diminished static standing balance with narrow BOS. Dynamic balance to be further assessed and f/u visit. She has related pain and difficulty with standing, walking, driving, and sitting with legs crossed. She requires skilled PT services at this time to address relevant deficits and improve overall function.     OBJECTIVE IMPAIRMENTS: decreased activity tolerance, decreased balance, decreased endurance, decreased strength, and pain.   ACTIVITY LIMITATIONS: carrying, sitting, standing, stairs, and locomotion level  PARTICIPATION LIMITATIONS: cleaning, interpersonal relationship, driving, shopping, community activity, and occupation  PERSONAL FACTORS: Time since onset of injury/illness/exacerbation and 3+ comorbidities: Relevant PMHx includes fibromyalgia, scoliosis, peripheral neuropathy, BIL knee pain, vitamin D  deficiency are also affecting patient's functional outcome.   REHAB POTENTIAL: Fair    CLINICAL DECISION MAKING: Evolving/moderate complexity  EVALUATION COMPLEXITY: Moderate   GOALS: Goals reviewed with patient? YES  SHORT TERM GOALS: Target date:  10/20/2024   Patient will be independent with initial home program at least 3 days/week.  Baseline: provided at eval Goal Status: MET  2.  Patient will demonstrate improved BIL LE MMT scores to at least 4+/5.  Baseline:  Goal Status: Partially met 12/07/24: Improvements made, see above chart   LONG TERM GOALS: Target date: 12/07/2024   Patient will report improved overall functional ability with LEFS score of 60/80 or greater.  Baseline: 44/80 11/16/2024: 36/80 Goal Status: NOT MET 12/07/24: 37/80   2.  Patient will report 7/10 or greater PSFS score for long distance driving.  Baseline: 5/10 Goal status: NOT MET 12/07/24: 5/10  3.  Patient will have 20/24 on DGI assessment in order to demonstrate decreased fall risk.  Baseline: to be assessed at f/u visit Goal status: INITIAL    PLAN:  PT FREQUENCY: 1-2x/week  PT DURATION: 3 weeks  PLANNED INTERVENTIONS: 97164- PT Re-evaluation, 97750- Physical Performance Testing, 97110-Therapeutic exercises, 97530- Therapeutic activity, 97112- Neuromuscular re-education, 97535- Self Care, [id number]- Manual therapy, [phone]- Aquatic Therapy, [id number]- Electrical stimulation (unattended), [phone]- Electrical stimulation (manual), 20560 (1-2 muscles), 20561 (3+ muscles)- Dry Needling, Patient/Family education, Balance training, Taping, Joint mobilization, Cryotherapy, and Moist heat  PLAN FOR NEXT SESSION: quadriceps and gluteal activation (high volume)   Corean Pouch PTA  12/07/2024 9:52 AM  "

## 2024-12-09 ENCOUNTER — Other Ambulatory Visit: Payer: Self-pay | Admitting: Physician Assistant

## 2024-12-09 DIAGNOSIS — K580 Irritable bowel syndrome with diarrhea: Secondary | ICD-10-CM

## 2024-12-23 NOTE — Assessment & Plan Note (Signed)
 SABRA

## 2024-12-23 NOTE — Progress Notes (Unsigned)
 "  Office Visit Note  Patient: Sarah [name]             Date of Birth: Apr 18, 1976           MRN: [id number]             PCP: Nicholaus Credit, PA-C Referring: Nicholaus Credit, PA-C Visit Date: 01/04/2025   Subjective:  No chief complaint on file.   History of Present Illness: Sarah [name] is a 49 y.o. female here for follow up of osteoarthritis and fibromyalgia on cymbalta  60 mg and Celebrex  200 mg twice daily.    Previous HPI 08/27/2024 Sarah [name] is a 49 y.o. female here for follow up of osteoarthritis and fibromyalgia on cymbalta  60 mg and Celebrex  200 mg twice daily.    She experiences persistent, nagging pain in her right leg worst at thr hip and thigh. The pain is described as sore and is exacerbated by prolonged standing at work. The discomfort extends from her leg up to her hip, with notable soreness in the hip area.   Her feet are starting to turn in when she stands, which she attributes to possible knee rotation. Her knees frequently pop. She reports a sensation of imbalance, often running into her truck from the left side.   She has a history of high arches, with one arch crushed from a previous surgery. She has not seen a podiatrist before and denies any specific injuries to her hip. Despite the absence of recent trauma, her hip discomfort has been worsening over time.   No specific injuries to her hip or change in activity level.       Previous HPI 02/20/2024 Sarah [name] is a 49 y.o. female here for follow up for osteoarthritis and fibromyalgia on cymbalta  60 mg and Celebrex  200 mg twice daily.  She has now had carpal tunnel release surgery on [name] wrists with resolution of the associated finger pain and numbness.  Did have 1 injection in the left hand after that surgery.   She experiences numbness and tingling in the bottom of her feet, described as 'pins and needles,' which began a couple of months ago. These symptoms occur once or twice a week and resolve  spontaneously. There is no significant pain, but she describes the sensation as 'needles sticking' in her feet. Occasional swelling is noted in her ankles, with more frequent swelling in her knees.   She has a history of knee issues, reporting that her knees sometimes feel as though they are 'locking up,' particularly upon waking. This has been occurring for three to four months. She experiences significant pain during these episodes, which sometimes wake her at night. More swelling is noted in her right knee compared to her left. She continues to take Celebrex  twice daily and Cymbalta , which she has been on long-term.   She reports occasional dizziness and expresses concern about her body's overall pain, similar to when she first sought treatment for fibromyalgia. A glucose A1c test done by her family doctor was normal. She experiences difficulty rising from the floor, requiring her to use her hands and knees for support.   She mentions a history of stomach pain, which has improved since starting Bentyl  (dicyclomine ). Occasional nausea is present, but she reports regular bowel movements. She has been previously noted to have vitamin D  deficiency despite taking over-the-counter supplements. No recent illness, but she mentions upper respiratory symptoms with a productive cough in the mornings. No recent changes in her  diet or significant gastrointestinal symptoms aside from occasional nausea. No recent changes in her smoking status and denies numbness in her fingers.       Previous HPI 08/23/2023 Sarah [name] is a 49 y.o. female here for follow up for carpal tunnel syndrome and fibromyalgia on cymbalta  60 mg and Celebrex  200 mg twice daily.  Since our last visit she saw Dr. Murrell and had right wrist carpal tunnel release surgery on August 5.  So far she is still working on her postoperative rehab exercises still has some stiffness and decreased extension range of motion.  She has been out of work since  surgery for recovery and her day-to-day pain in hips and knees has mostly been better.  Currently experiencing an increase in knee pain worse on the right side with a small amount of swelling.  This has been coming and going lasting for days at a time and for several years.   Previous HPI 02/11/2023 Sarah [name] is a 49 y.o. female here for follow up for carpal tunnel syndrome and fibromyalgia on cymbalta  60 mg and recent wrist injections January. Recent GI illness diarrhea and not eating labs last Wednesday low albumin Thursday with low electrolytes. Getting more muscle cramping in [name] legs but not a full spasm or cramp like she has normally had in the past. Now eating well again since yesterday. Flexeril helps but only taking sometimes due to drowsiness.   Previous HPI 08/13/22 Sarah [name] is a 49 y.o. female here for follow up with joint pains and myofascial pain on cymbalta  60 mg daily and celebrex  200 mg daily or as needed. 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 She notices some shakiness or tremor in her hands that might be increased. Neck pain and muscle soreness. She has some elbow pains and there is a rash on her left elbow just in the past week or two. She also is having pain around her right hip and radiating into the right leg sometimes often after standing from driving for 69-59 minutes.   Previous HPI 12/05/22 Patient presented today for bilateral ultrasound-guided carpal tunnel steroid injection due to progressively worsening hand pain and numbness again since November of last year.  Reviewed previous procedure which which we did in June after which she initially had complete improvement in symptoms before they started to return.  Tolerated the previous injections with no adverse effect.   Injections performed today under ultrasound guidance [name] sides.  Patient  experienced a brief vagal episode which recovered after approximately 10 minutes waiting.  Provided printed range of motion exercises for carpal and cubital tunnel symptoms.   02/08/2022  Sarah [name] is a 49 y.o. female here for follow up with joint pains and myofascial pain after starting cymbalta  and titrate to 60 mg and with celebrex  200 mg BID. She noticed an improvement in pain of [name] legs after increasing the cymbalta  dose to 60 mg. She has intermittent hand numbness continuing to come and go in [name] hands, bothering her a bit more than before. She reports one incident of using too hot water for her son with inability to feel temperature.   Previous HPI 11/09/21 Sarah [name] is a 49 y.o. female here for follow up with multiple symptoms including joint pains, fatigue, numbness, and unintentional weight loss with positive ANA serology. Labs at initial visit showing  positive RNP 1.5 otherwise normal CK norma complements low ANA titer 1:40. She continues having ongoing symptoms with pain all over worst in legs at the moment and very fatigued.   Previous HPI 10/12/21 Sarah [name] is a 49 y.o. female here for joint pains, numbness, fatigue, and weight loss with positive ANA testing.  Symptoms have really been ongoing since last year no specific onset or proceeding infections or medical events that she can recall.  Initially this was accompanied by a generalized body aches and fatigue as well as a progressive unexplained weight loss from her baseline down to as low as 105 pounds.  She has had some amount of chronic joint pain previous evaluations of bilateral rotator cuff arthropathy, mild degenerative disc disease in the lumbar spine, previous right leg fracture with residual swelling intermittently at the ankle and knee.  However these were increased additionally with problems of a lot of stiffness and pain in the morning and getting up from stationary positions particularly in bilateral hips.  Particularly notices pain whenever she has to drive for an hour or longer at a time.  These have improved a lot on Celebrex  but she feels symptoms quickly worsen again if off the medication. More recently she is also started having muscle cramping particularly in her legs not associated with any particular position or activity.  She also reports numbness and tingling sensation particularly in bilateral hands this occurs overnight and first thing in the morning as well as periodically throughout the day.  She had nerve conduction study for this reportedly showing minimal carpal tunnel syndrome not felt to adequately explain symptoms.  She tried wearing wrist braces for this with no symptom improvement.  Throughout that time she denied any loss of appetite only gastrointestinal symptom was increase in loose frequent stools not associated with any pain or bleeding.  She described frequent nighttime awakening often every 1-2 hours without specific causes and has severe fatigue and daytime somnolence able to fall asleep unintentionally whenever she stops moving. She denies new hair loss, oral ulcers, raynaud's symptoms, lymphadenopathy, or history of blood clots. She reports dry mouth and dry skin symptoms but no problems with her eyes. 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 She has headaches usually bilateral lasting for up to a few hours at a time. She has urinary urgency with occasional urge incontinence. She does not report allodynia symptoms.   LAbs reviewed 08/2021 ANA pos RF neg CCP neg ESR 3 CRP <1 TSH 2.54 CBC wnl CMP Ca 10.4   Imaging reviewed 04/21/21 MR Cardiac stress test IMPRESSION: 1.  Normal stress perfusion 2.  No evidence of cardiac amyloidosis 3.  Normal LV size, wall thickness, and systolic function (EF 62%) 4.  Normal RV size and systolic function (EF 69%) 5.  No late gadolinium enhancement to suggest myocardial  scar   No Rheumatology ROS completed.   PMFS History:  Patient Active Problem List   Diagnosis Date Noted   Peripheral neuropathy 03/12/2024   Bilateral knee pain 08/23/2023   Flank pain 03/18/2023   Family history of elevated blood lipids 03/18/2023   Screening for colon cancer 09/10/2022   Gastroesophageal reflux disease without esophagitis 09/10/2022   Needs flu shot 09/10/2022   Vitamin D  deficiency 08/13/2022   Pain in right hip 08/13/2022   Facial rash 02/08/2022   Fibromyalgia 11/09/2021   Fibromyalgia syndrome 11/09/2021   Positive ANA (antinuclear antibody) 10/12/2021  Impingement syndrome of right shoulder 07/07/2021   Labral tear of shoulder, degenerative, right 07/07/2021   Carpal tunnel syndrome, bilateral 07/07/2021   DDD (degenerative disc disease), cervical 07/07/2021   Other fatigue 05/08/2021   Leg cramps 05/08/2021   Frequency of urination 05/08/2021   Irregular menses 05/08/2021   Scoliosis    Palpitations 01/26/2021   Left breast mass 01/26/2021   Polyuria 01/06/2021   Hypercalcemia 01/06/2021   Malaise 01/06/2021   Need for tetanus, diphtheria, and acellular pertussis (Tdap) vaccine 01/06/2021   Weight loss 01/02/2021   Hyperglycemia 01/02/2021    Past Medical History:  Diagnosis Date   Carpal tunnel syndrome    Fibromyalgia    GERD (gastroesophageal reflux disease)    Hernia, hiatal    Hypercalcemia 01/06/2021   Left breast mass 01/26/2021   Liver cyst    Malaise 01/06/2021   Need for tetanus, diphtheria, and acellular pertussis (Tdap) vaccine 01/06/2021   Palpitations 01/26/2021   Polyuria 01/06/2021   Scoliosis    Weight loss 01/02/2021    Family History  Problem Relation Age of Onset   Heart disease Mother    Hyperlipidemia Mother    Anxiety disorder Sister    GER disease Son    Asthma Son    GER disease Son    Heart disease Son    Past Surgical History:  Procedure Laterality Date   CARPAL TUNNEL RELEASE Right 07/01/2023    Procedure: right carpal tunnel release;  Surgeon: Murrell Drivers, MD;  Location: [facility];  Service: Orthopedics;  Laterality: Right;   CARPAL TUNNEL RELEASE Left 11/11/2023   Procedure: LEFT CARPAL TUNNEL RELEASE;  Surgeon: Murrell Drivers, MD;  Location: [facility];  Service: Orthopedics;  Laterality: Left;  30 MIN   CERVICAL SPINE SURGERY     Left fooscrew and plate [id number]      TUBAL LIGATION     Social History   Social History Narrative   Not on file   Immunization History  Administered Date(s) Administered   Influenza, Seasonal, Injecte, Preservative Fre 09/24/2023, 10/01/2024   Influenza,inj,Quad PF,6+ Mos 09/10/2022   PFIZER(Purple Top)SARS-COV-2 Vaccination 06/28/2020, 07/19/2020   PPD Test 12/02/2024   Tdap 01/06/2021     Objective: Vital Signs: There were no vitals taken for this visit.   Physical Exam   Musculoskeletal Exam: ***   Investigation: No additional findings.  Imaging: No results found.  Recent Labs: Lab Results  Component Value Date   WBC 6.4 10/01/2024   HGB 14.3 10/01/2024   PLT 297 10/01/2024   NA 141 10/01/2024   K 5.1 10/01/2024   CL 103 10/01/2024   CO2 25 10/01/2024   GLUCOSE 78 10/01/2024   BUN 11 10/01/2024   CREATININE 0.92 10/01/2024   BILITOT 0.3 10/01/2024   ALKPHOS 81 10/01/2024   AST 25 10/01/2024   ALT 25 10/01/2024   PROT 7.0 10/01/2024   ALBUMIN 4.5 10/01/2024   CALCIUM 11.2 (H) 10/06/2024   GFRAA 115 01/06/2021    Speciality Comments: No specialty comments available.  Procedures:  No procedures performed Allergies: Meloxicam   Assessment / Plan:     Visit Diagnoses:  Assessment & Plan Fibromyalgia      ***  Follow-Up Instructions: No follow-ups on file.   Suzanne Garbers M Keriana Sarsfield, CMA  Note - This record has been created using Animal nutritionist.  Chart creation errors have been sought, but may not always  have been located. Such creation errors do not reflect on  the standard of  medical care. "

## 2024-12-29 ENCOUNTER — Encounter: Payer: Self-pay | Admitting: Cardiology

## 2024-12-29 ENCOUNTER — Ambulatory Visit: Payer: Self-pay | Admitting: Cardiology

## 2024-12-29 VITALS — Ht 65.0 in | Wt 135.0 lb

## 2024-12-29 DIAGNOSIS — R400 Somnolence: Secondary | ICD-10-CM

## 2024-12-29 DIAGNOSIS — E782 Mixed hyperlipidemia: Secondary | ICD-10-CM

## 2024-12-29 DIAGNOSIS — I471 Supraventricular tachycardia, unspecified: Secondary | ICD-10-CM

## 2024-12-29 MED ORDER — METOPROLOL SUCCINATE ER 25 MG PO TB24: 12.5000 mg | ORAL_TABLET | Freq: Two times a day (BID) | ORAL | 3 refills | Status: AC

## 2024-12-30 ENCOUNTER — Other Ambulatory Visit: Payer: Self-pay | Admitting: Physician Assistant

## 2024-12-30 ENCOUNTER — Other Ambulatory Visit: Payer: Self-pay | Admitting: Internal Medicine

## 2024-12-30 DIAGNOSIS — K219 Gastro-esophageal reflux disease without esophagitis: Secondary | ICD-10-CM

## 2024-12-30 DIAGNOSIS — M797 Fibromyalgia: Secondary | ICD-10-CM

## 2024-12-30 NOTE — Telephone Encounter (Signed)
 Last Fill: 06/25/2024  Labs: 10/01/2024 RFE:Rjorplf:89.2 CBC:WNL  Next Visit: 01/04/2025  Last Visit: 08/27/2024  DX: Fibromyalgia   Current Dose per office note 08/27/2024: Celebrex  200 mg daily or twice daily as needed   Okay to refill Celebrex ?

## 2024-12-31 ENCOUNTER — Other Ambulatory Visit: Payer: Self-pay | Admitting: Physician Assistant

## 2024-12-31 DIAGNOSIS — E782 Mixed hyperlipidemia: Secondary | ICD-10-CM

## 2025-01-04 ENCOUNTER — Ambulatory Visit: Payer: Self-pay | Admitting: Internal Medicine

## 2025-01-04 DIAGNOSIS — M797 Fibromyalgia: Secondary | ICD-10-CM

## 2025-01-26 ENCOUNTER — Ambulatory Visit: Admitting: "Endocrinology

## 2025-02-25 ENCOUNTER — Ambulatory Visit: Admitting: Internal Medicine

## 2025-04-01 ENCOUNTER — Ambulatory Visit: Admitting: Physician Assistant

## 2025-04-16 ENCOUNTER — Ambulatory Visit: Payer: Self-pay | Admitting: Cardiology
